# Patient Record
Sex: Male | Born: 1955
Health system: Southern US, Community
[De-identification: ages and names within clinical notes are randomized; demographics above are authoritative.]

## PROBLEM LIST (undated history)

## (undated) DIAGNOSIS — K219 Gastro-esophageal reflux disease without esophagitis: Secondary | ICD-10-CM

## (undated) DIAGNOSIS — Z21 Asymptomatic human immunodeficiency virus [HIV] infection status: Secondary | ICD-10-CM

## (undated) DIAGNOSIS — M069 Rheumatoid arthritis, unspecified: Secondary | ICD-10-CM

## (undated) DIAGNOSIS — J302 Other seasonal allergic rhinitis: Secondary | ICD-10-CM

## (undated) DIAGNOSIS — B20 Human immunodeficiency virus [HIV] disease: Secondary | ICD-10-CM

## (undated) HISTORY — PX: KNEE SURGERY: SHX244

## (undated) HISTORY — DX: Gastro-esophageal reflux disease without esophagitis: K21.9

## (undated) HISTORY — PX: NASAL SEPTUM SURGERY: SHX37

---

## 1998-05-11 ENCOUNTER — Ambulatory Visit (HOSPITAL_COMMUNITY): Admission: RE | Admit: 1998-05-11 | Discharge: 1998-05-11 | Payer: Self-pay

## 2000-11-06 ENCOUNTER — Encounter (INDEPENDENT_AMBULATORY_CARE_PROVIDER_SITE_OTHER): Payer: Self-pay | Admitting: *Deleted

## 2000-11-06 LAB — CONVERTED CEMR LAB: CD4 T Cell Abs: 456

## 2000-12-11 ENCOUNTER — Ambulatory Visit (HOSPITAL_COMMUNITY): Admission: RE | Admit: 2000-12-11 | Discharge: 2000-12-11 | Payer: Self-pay | Admitting: Internal Medicine

## 2000-12-11 ENCOUNTER — Encounter: Admission: RE | Admit: 2000-12-11 | Discharge: 2000-12-11 | Payer: Self-pay | Admitting: Internal Medicine

## 2001-01-01 ENCOUNTER — Encounter: Admission: RE | Admit: 2001-01-01 | Discharge: 2001-01-01 | Payer: Self-pay | Admitting: Internal Medicine

## 2001-01-04 ENCOUNTER — Ambulatory Visit (HOSPITAL_COMMUNITY): Admission: RE | Admit: 2001-01-04 | Discharge: 2001-01-04 | Payer: Self-pay | Admitting: Internal Medicine

## 2001-01-04 ENCOUNTER — Encounter: Admission: RE | Admit: 2001-01-04 | Discharge: 2001-01-04 | Payer: Self-pay | Admitting: Internal Medicine

## 2001-02-08 ENCOUNTER — Encounter: Admission: RE | Admit: 2001-02-08 | Discharge: 2001-02-08 | Payer: Self-pay | Admitting: Internal Medicine

## 2001-02-25 ENCOUNTER — Encounter: Admission: RE | Admit: 2001-02-25 | Discharge: 2001-02-25 | Payer: Self-pay | Admitting: Internal Medicine

## 2001-04-07 ENCOUNTER — Encounter: Admission: RE | Admit: 2001-04-07 | Discharge: 2001-04-07 | Payer: Self-pay | Admitting: Internal Medicine

## 2001-04-07 ENCOUNTER — Ambulatory Visit (HOSPITAL_COMMUNITY): Admission: RE | Admit: 2001-04-07 | Discharge: 2001-04-07 | Payer: Self-pay | Admitting: Internal Medicine

## 2001-04-27 ENCOUNTER — Encounter: Admission: RE | Admit: 2001-04-27 | Discharge: 2001-04-27 | Payer: Self-pay | Admitting: Internal Medicine

## 2001-08-11 ENCOUNTER — Encounter: Admission: RE | Admit: 2001-08-11 | Discharge: 2001-08-11 | Payer: Self-pay | Admitting: Internal Medicine

## 2001-08-11 ENCOUNTER — Ambulatory Visit (HOSPITAL_COMMUNITY): Admission: RE | Admit: 2001-08-11 | Discharge: 2001-08-11 | Payer: Self-pay | Admitting: Internal Medicine

## 2001-08-25 ENCOUNTER — Encounter: Admission: RE | Admit: 2001-08-25 | Discharge: 2001-08-25 | Payer: Self-pay | Admitting: Internal Medicine

## 2001-10-08 ENCOUNTER — Encounter: Admission: RE | Admit: 2001-10-08 | Discharge: 2001-10-08 | Payer: Self-pay | Admitting: Infectious Diseases

## 2001-11-08 ENCOUNTER — Ambulatory Visit (HOSPITAL_COMMUNITY): Admission: RE | Admit: 2001-11-08 | Discharge: 2001-11-08 | Payer: Self-pay | Admitting: Internal Medicine

## 2001-11-08 ENCOUNTER — Encounter: Admission: RE | Admit: 2001-11-08 | Discharge: 2001-11-08 | Payer: Self-pay | Admitting: Internal Medicine

## 2001-11-23 ENCOUNTER — Encounter: Admission: RE | Admit: 2001-11-23 | Discharge: 2001-11-23 | Payer: Self-pay | Admitting: Internal Medicine

## 2002-02-07 ENCOUNTER — Ambulatory Visit (HOSPITAL_COMMUNITY): Admission: RE | Admit: 2002-02-07 | Discharge: 2002-02-07 | Payer: Self-pay | Admitting: Internal Medicine

## 2002-02-07 ENCOUNTER — Encounter: Admission: RE | Admit: 2002-02-07 | Discharge: 2002-02-07 | Payer: Self-pay | Admitting: Internal Medicine

## 2002-02-22 ENCOUNTER — Ambulatory Visit (HOSPITAL_COMMUNITY): Admission: RE | Admit: 2002-02-22 | Discharge: 2002-02-22 | Payer: Self-pay | Admitting: Internal Medicine

## 2002-02-22 ENCOUNTER — Encounter: Admission: RE | Admit: 2002-02-22 | Discharge: 2002-02-22 | Payer: Self-pay | Admitting: Internal Medicine

## 2002-03-04 ENCOUNTER — Encounter: Admission: RE | Admit: 2002-03-04 | Discharge: 2002-03-04 | Payer: Self-pay | Admitting: Internal Medicine

## 2002-04-12 ENCOUNTER — Ambulatory Visit (HOSPITAL_COMMUNITY): Admission: RE | Admit: 2002-04-12 | Discharge: 2002-04-12 | Payer: Self-pay | Admitting: Internal Medicine

## 2002-04-12 ENCOUNTER — Encounter: Admission: RE | Admit: 2002-04-12 | Discharge: 2002-04-12 | Payer: Self-pay | Admitting: Internal Medicine

## 2002-05-03 ENCOUNTER — Encounter: Admission: RE | Admit: 2002-05-03 | Discharge: 2002-05-03 | Payer: Self-pay | Admitting: Internal Medicine

## 2002-07-15 ENCOUNTER — Encounter: Admission: RE | Admit: 2002-07-15 | Discharge: 2002-07-15 | Payer: Self-pay | Admitting: Internal Medicine

## 2002-07-15 ENCOUNTER — Ambulatory Visit (HOSPITAL_COMMUNITY): Admission: RE | Admit: 2002-07-15 | Discharge: 2002-07-15 | Payer: Self-pay | Admitting: Internal Medicine

## 2002-08-02 ENCOUNTER — Encounter: Admission: RE | Admit: 2002-08-02 | Discharge: 2002-08-02 | Payer: Self-pay | Admitting: Internal Medicine

## 2002-09-13 ENCOUNTER — Ambulatory Visit (HOSPITAL_COMMUNITY): Admission: RE | Admit: 2002-09-13 | Discharge: 2002-09-13 | Payer: Self-pay | Admitting: Internal Medicine

## 2002-09-27 ENCOUNTER — Encounter: Admission: RE | Admit: 2002-09-27 | Discharge: 2002-09-27 | Payer: Self-pay | Admitting: Internal Medicine

## 2002-12-12 ENCOUNTER — Ambulatory Visit (HOSPITAL_COMMUNITY): Admission: RE | Admit: 2002-12-12 | Discharge: 2002-12-12 | Payer: Self-pay | Admitting: Internal Medicine

## 2002-12-12 ENCOUNTER — Encounter: Admission: RE | Admit: 2002-12-12 | Discharge: 2002-12-12 | Payer: Self-pay | Admitting: Internal Medicine

## 2002-12-26 ENCOUNTER — Encounter: Admission: RE | Admit: 2002-12-26 | Discharge: 2002-12-26 | Payer: Self-pay | Admitting: Internal Medicine

## 2003-04-04 ENCOUNTER — Encounter: Payer: Self-pay | Admitting: Internal Medicine

## 2003-04-04 ENCOUNTER — Encounter: Admission: RE | Admit: 2003-04-04 | Discharge: 2003-04-04 | Payer: Self-pay | Admitting: Internal Medicine

## 2003-04-18 ENCOUNTER — Encounter: Admission: RE | Admit: 2003-04-18 | Discharge: 2003-04-18 | Payer: Self-pay | Admitting: Internal Medicine

## 2003-10-03 ENCOUNTER — Encounter: Payer: Self-pay | Admitting: Internal Medicine

## 2003-10-03 ENCOUNTER — Encounter: Admission: RE | Admit: 2003-10-03 | Discharge: 2003-10-03 | Payer: Self-pay | Admitting: Internal Medicine

## 2003-10-03 ENCOUNTER — Ambulatory Visit (HOSPITAL_COMMUNITY): Admission: RE | Admit: 2003-10-03 | Discharge: 2003-10-03 | Payer: Self-pay | Admitting: Internal Medicine

## 2003-10-17 ENCOUNTER — Encounter: Admission: RE | Admit: 2003-10-17 | Discharge: 2003-10-17 | Payer: Self-pay | Admitting: Internal Medicine

## 2004-03-19 ENCOUNTER — Encounter: Admission: RE | Admit: 2004-03-19 | Discharge: 2004-03-19 | Payer: Self-pay | Admitting: Internal Medicine

## 2004-03-19 ENCOUNTER — Ambulatory Visit (HOSPITAL_COMMUNITY): Admission: RE | Admit: 2004-03-19 | Discharge: 2004-03-19 | Payer: Self-pay | Admitting: Internal Medicine

## 2004-04-09 ENCOUNTER — Encounter: Admission: RE | Admit: 2004-04-09 | Discharge: 2004-04-09 | Payer: Self-pay | Admitting: Internal Medicine

## 2004-09-10 ENCOUNTER — Ambulatory Visit: Payer: Self-pay | Admitting: Internal Medicine

## 2004-09-10 ENCOUNTER — Ambulatory Visit (HOSPITAL_COMMUNITY): Admission: RE | Admit: 2004-09-10 | Discharge: 2004-09-10 | Payer: Self-pay | Admitting: Internal Medicine

## 2004-09-24 ENCOUNTER — Ambulatory Visit: Payer: Self-pay | Admitting: Internal Medicine

## 2004-10-22 ENCOUNTER — Ambulatory Visit: Payer: Self-pay | Admitting: Internal Medicine

## 2005-02-25 ENCOUNTER — Ambulatory Visit (HOSPITAL_COMMUNITY): Admission: RE | Admit: 2005-02-25 | Discharge: 2005-02-25 | Payer: Self-pay | Admitting: Internal Medicine

## 2005-02-25 ENCOUNTER — Ambulatory Visit: Payer: Self-pay | Admitting: Internal Medicine

## 2005-03-11 ENCOUNTER — Ambulatory Visit: Payer: Self-pay | Admitting: Internal Medicine

## 2005-08-26 ENCOUNTER — Ambulatory Visit (HOSPITAL_COMMUNITY): Admission: RE | Admit: 2005-08-26 | Discharge: 2005-08-26 | Payer: Self-pay | Admitting: Internal Medicine

## 2005-08-26 ENCOUNTER — Encounter (INDEPENDENT_AMBULATORY_CARE_PROVIDER_SITE_OTHER): Payer: Self-pay | Admitting: *Deleted

## 2005-08-26 ENCOUNTER — Ambulatory Visit: Payer: Self-pay | Admitting: Internal Medicine

## 2005-08-26 LAB — CONVERTED CEMR LAB
CD4 Count: 730 microliters
HIV 1 RNA Quant: 399 copies/mL

## 2005-09-09 ENCOUNTER — Ambulatory Visit: Payer: Self-pay | Admitting: Internal Medicine

## 2006-01-27 ENCOUNTER — Ambulatory Visit: Payer: Self-pay | Admitting: Internal Medicine

## 2006-01-27 ENCOUNTER — Encounter (INDEPENDENT_AMBULATORY_CARE_PROVIDER_SITE_OTHER): Payer: Self-pay | Admitting: *Deleted

## 2006-01-27 LAB — CONVERTED CEMR LAB: HIV 1 RNA Quant: 399 copies/mL

## 2006-02-10 ENCOUNTER — Ambulatory Visit: Payer: Self-pay | Admitting: Internal Medicine

## 2006-06-22 ENCOUNTER — Encounter (INDEPENDENT_AMBULATORY_CARE_PROVIDER_SITE_OTHER): Payer: Self-pay | Admitting: *Deleted

## 2006-06-22 ENCOUNTER — Encounter: Admission: RE | Admit: 2006-06-22 | Discharge: 2006-06-22 | Payer: Self-pay | Admitting: Internal Medicine

## 2006-06-22 ENCOUNTER — Ambulatory Visit: Payer: Self-pay | Admitting: Internal Medicine

## 2006-06-22 LAB — CONVERTED CEMR LAB: CD4 Count: 630 microliters

## 2006-08-17 ENCOUNTER — Ambulatory Visit: Payer: Self-pay | Admitting: Internal Medicine

## 2007-02-01 ENCOUNTER — Encounter (INDEPENDENT_AMBULATORY_CARE_PROVIDER_SITE_OTHER): Payer: Self-pay | Admitting: *Deleted

## 2007-02-01 LAB — CONVERTED CEMR LAB

## 2007-02-09 ENCOUNTER — Ambulatory Visit: Payer: Self-pay | Admitting: Internal Medicine

## 2007-02-09 ENCOUNTER — Encounter: Admission: RE | Admit: 2007-02-09 | Discharge: 2007-02-09 | Payer: Self-pay | Admitting: Internal Medicine

## 2007-02-09 LAB — CONVERTED CEMR LAB
AST: 25 units/L (ref 0–37)
Albumin: 4.4 g/dL (ref 3.5–5.2)
Alkaline Phosphatase: 73 units/L (ref 39–117)
Basophils Relative: 0 % (ref 0–1)
Calcium: 9.6 mg/dL (ref 8.4–10.5)
Chloride: 105 meq/L (ref 96–112)
Eosinophils Absolute: 0.1 10*3/uL (ref 0.0–0.7)
HIV 1 RNA Quant: 376 copies/mL — ABNORMAL HIGH (ref <50)
HIV-1 RNA Quant, Log: 2.58 — ABNORMAL HIGH (ref <1.70)
LDL Cholesterol: 99 mg/dL (ref 0–99)
Lymphs Abs: 1.6 10*3/uL (ref 0.7–3.3)
MCV: 87.8 fL (ref 78.0–100.0)
Monocytes Relative: 8 % (ref 3–11)
Neutro Abs: 2.9 10*3/uL (ref 1.7–7.7)
Neutrophils Relative %: 58 % (ref 43–77)
PSA: 0.53 ng/mL (ref 0.10–4.00)
Platelets: 218 10*3/uL (ref 150–400)
Potassium: 4.4 meq/L (ref 3.5–5.3)
RBC: 4.99 M/uL (ref 4.22–5.81)
Sodium: 138 meq/L (ref 135–145)
Total Protein: 7.5 g/dL (ref 6.0–8.3)
WBC: 5 10*3/uL (ref 4.0–10.5)

## 2007-02-12 DIAGNOSIS — J309 Allergic rhinitis, unspecified: Secondary | ICD-10-CM | POA: Insufficient documentation

## 2007-02-12 DIAGNOSIS — S99919A Unspecified injury of unspecified ankle, initial encounter: Secondary | ICD-10-CM

## 2007-02-12 DIAGNOSIS — F329 Major depressive disorder, single episode, unspecified: Secondary | ICD-10-CM

## 2007-02-12 DIAGNOSIS — B029 Zoster without complications: Secondary | ICD-10-CM | POA: Insufficient documentation

## 2007-02-12 DIAGNOSIS — R197 Diarrhea, unspecified: Secondary | ICD-10-CM | POA: Insufficient documentation

## 2007-02-12 DIAGNOSIS — K5289 Other specified noninfective gastroenteritis and colitis: Secondary | ICD-10-CM

## 2007-02-12 DIAGNOSIS — L408 Other psoriasis: Secondary | ICD-10-CM

## 2007-02-12 DIAGNOSIS — J342 Deviated nasal septum: Secondary | ICD-10-CM

## 2007-02-12 DIAGNOSIS — S8990XA Unspecified injury of unspecified lower leg, initial encounter: Secondary | ICD-10-CM

## 2007-02-12 DIAGNOSIS — B20 Human immunodeficiency virus [HIV] disease: Secondary | ICD-10-CM | POA: Insufficient documentation

## 2007-02-12 DIAGNOSIS — S99929A Unspecified injury of unspecified foot, initial encounter: Secondary | ICD-10-CM

## 2007-02-14 ENCOUNTER — Encounter (INDEPENDENT_AMBULATORY_CARE_PROVIDER_SITE_OTHER): Payer: Self-pay | Admitting: *Deleted

## 2007-02-23 ENCOUNTER — Ambulatory Visit: Payer: Self-pay | Admitting: Internal Medicine

## 2007-02-23 DIAGNOSIS — B009 Herpesviral infection, unspecified: Secondary | ICD-10-CM | POA: Insufficient documentation

## 2007-02-23 DIAGNOSIS — K219 Gastro-esophageal reflux disease without esophagitis: Secondary | ICD-10-CM

## 2007-05-10 ENCOUNTER — Encounter: Payer: Self-pay | Admitting: Internal Medicine

## 2007-07-27 ENCOUNTER — Telehealth: Payer: Self-pay | Admitting: Internal Medicine

## 2007-08-03 ENCOUNTER — Encounter: Admission: RE | Admit: 2007-08-03 | Discharge: 2007-08-03 | Payer: Self-pay | Admitting: Internal Medicine

## 2007-08-03 ENCOUNTER — Telehealth: Payer: Self-pay | Admitting: Internal Medicine

## 2007-08-03 ENCOUNTER — Ambulatory Visit: Payer: Self-pay | Admitting: Internal Medicine

## 2007-08-23 ENCOUNTER — Ambulatory Visit: Payer: Self-pay | Admitting: Internal Medicine

## 2007-08-23 ENCOUNTER — Ambulatory Visit (HOSPITAL_COMMUNITY): Admission: RE | Admit: 2007-08-23 | Discharge: 2007-08-23 | Payer: Self-pay | Admitting: Internal Medicine

## 2007-08-25 ENCOUNTER — Ambulatory Visit: Payer: Self-pay | Admitting: Internal Medicine

## 2007-08-30 ENCOUNTER — Telehealth: Payer: Self-pay | Admitting: Internal Medicine

## 2007-10-06 ENCOUNTER — Encounter: Payer: Self-pay | Admitting: Internal Medicine

## 2007-11-23 ENCOUNTER — Ambulatory Visit: Payer: Self-pay | Admitting: Internal Medicine

## 2007-11-26 ENCOUNTER — Encounter (INDEPENDENT_AMBULATORY_CARE_PROVIDER_SITE_OTHER): Payer: Self-pay | Admitting: *Deleted

## 2007-12-07 ENCOUNTER — Encounter (INDEPENDENT_AMBULATORY_CARE_PROVIDER_SITE_OTHER): Payer: Self-pay | Admitting: *Deleted

## 2008-05-08 ENCOUNTER — Ambulatory Visit: Payer: Self-pay | Admitting: Internal Medicine

## 2008-05-08 ENCOUNTER — Encounter: Admission: RE | Admit: 2008-05-08 | Discharge: 2008-05-08 | Payer: Self-pay | Admitting: Internal Medicine

## 2008-05-08 LAB — CONVERTED CEMR LAB
ALT: 24 units/L (ref 0–53)
Alkaline Phosphatase: 79 units/L (ref 39–117)
Basophils Absolute: 0 10*3/uL (ref 0.0–0.1)
Basophils Relative: 0 % (ref 0–1)
CO2: 23 meq/L (ref 19–32)
Cholesterol: 168 mg/dL (ref 0–200)
Creatinine, Ser: 0.84 mg/dL (ref 0.40–1.50)
Eosinophils Absolute: 0.1 10*3/uL (ref 0.0–0.7)
Eosinophils Relative: 2 % (ref 0–5)
HCT: 42.9 % (ref 39.0–52.0)
HIV 1 RNA Quant: <50 copies/mL (ref <50)
Hemoglobin: 14.8 g/dL (ref 13.0–17.0)
Lymphocytes Relative: 33 % (ref 12–46)
MCHC: 34.5 g/dL (ref 30.0–36.0)
MCV: 88.6 fL (ref 78.0–100.0)
Monocytes Absolute: 0.5 10*3/uL (ref 0.1–1.0)
RDW: 13.3 % (ref 11.5–15.5)
Total Bilirubin: 0.5 mg/dL (ref 0.3–1.2)
Total CHOL/HDL Ratio: 3.6
VLDL: 26 mg/dL (ref 0–40)

## 2008-05-23 ENCOUNTER — Ambulatory Visit: Payer: Self-pay | Admitting: Internal Medicine

## 2008-05-23 DIAGNOSIS — K589 Irritable bowel syndrome without diarrhea: Secondary | ICD-10-CM

## 2008-09-12 ENCOUNTER — Encounter: Payer: Self-pay | Admitting: Internal Medicine

## 2008-09-20 ENCOUNTER — Encounter (INDEPENDENT_AMBULATORY_CARE_PROVIDER_SITE_OTHER): Payer: Self-pay | Admitting: *Deleted

## 2008-10-24 ENCOUNTER — Ambulatory Visit: Payer: Self-pay | Admitting: Internal Medicine

## 2008-11-14 ENCOUNTER — Ambulatory Visit: Payer: Self-pay | Admitting: Internal Medicine

## 2008-11-23 ENCOUNTER — Telehealth: Payer: Self-pay | Admitting: Internal Medicine

## 2009-04-25 ENCOUNTER — Ambulatory Visit: Payer: Self-pay | Admitting: Internal Medicine

## 2009-04-25 LAB — CONVERTED CEMR LAB
Albumin: 4.3 g/dL (ref 3.5–5.2)
Basophils Absolute: 0 10*3/uL (ref 0.0–0.1)
CO2: 23 meq/L (ref 19–32)
Cholesterol: 186 mg/dL (ref 0–200)
GFR calc Af Amer: >60 mL/min (ref >60)
GFR calc non Af Amer: >60 mL/min (ref >60)
Glucose, Bld: 93 mg/dL (ref 70–99)
HIV-1 RNA Quant, Log: 2.36 — ABNORMAL HIGH (ref <1.68)
LDL Cholesterol: 92 mg/dL (ref 0–99)
Lymphocytes Relative: 36 % (ref 12–46)
Neutro Abs: 2.8 10*3/uL (ref 1.7–7.7)
Platelets: 216 10*3/uL (ref 150–400)
Potassium: 4.3 meq/L (ref 3.5–5.3)
RDW: 13.5 % (ref 11.5–15.5)
Sodium: 138 meq/L (ref 135–145)
Total Bilirubin: 0.4 mg/dL (ref 0.3–1.2)
Total Protein: 7.4 g/dL (ref 6.0–8.3)
Triglycerides: 238 mg/dL — ABNORMAL HIGH (ref <150)

## 2009-05-15 ENCOUNTER — Ambulatory Visit: Payer: Self-pay | Admitting: Internal Medicine

## 2009-05-21 ENCOUNTER — Encounter (INDEPENDENT_AMBULATORY_CARE_PROVIDER_SITE_OTHER): Payer: Self-pay | Admitting: *Deleted

## 2009-11-07 ENCOUNTER — Ambulatory Visit: Payer: Self-pay | Admitting: Internal Medicine

## 2009-11-07 LAB — CONVERTED CEMR LAB: HIV 1 RNA Quant: 84 copies/mL — ABNORMAL HIGH (ref <48)

## 2009-11-22 ENCOUNTER — Ambulatory Visit: Payer: Self-pay | Admitting: Internal Medicine

## 2010-05-20 ENCOUNTER — Ambulatory Visit: Payer: Self-pay | Admitting: Internal Medicine

## 2010-05-20 LAB — CONVERTED CEMR LAB
ALT: 21 units/L (ref 0–53)
AST: 23 units/L (ref 0–37)
Alkaline Phosphatase: 73 units/L (ref 39–117)
Basophils Relative: 0 % (ref 0–1)
CO2: 21 meq/L (ref 19–32)
Cholesterol: 180 mg/dL (ref 0–200)
HIV 1 RNA Quant: <48 copies/mL (ref <48)
HIV-1 RNA Quant, Log: <1.68 (ref <1.68)
LDL Cholesterol: 101 mg/dL — ABNORMAL HIGH (ref 0–99)
MCHC: 34.9 g/dL (ref 30.0–36.0)
Monocytes Relative: 6 % (ref 3–12)
Neutro Abs: 3.3 10*3/uL (ref 1.7–7.7)
Neutrophils Relative %: 57 % (ref 43–77)
Platelets: 233 10*3/uL (ref 150–400)
RBC: 4.69 M/uL (ref 4.22–5.81)
Sodium: 139 meq/L (ref 135–145)
Total Bilirubin: 0.5 mg/dL (ref 0.3–1.2)
Total Protein: 7.2 g/dL (ref 6.0–8.3)
VLDL: 24 mg/dL (ref 0–40)
WBC: 5.9 10*3/uL (ref 4.0–10.5)

## 2010-06-04 ENCOUNTER — Ambulatory Visit: Payer: Self-pay | Admitting: Internal Medicine

## 2010-11-11 ENCOUNTER — Ambulatory Visit: Payer: Self-pay | Admitting: Internal Medicine

## 2010-11-11 LAB — CONVERTED CEMR LAB
ALT: 25 units/L (ref 0–53)
AST: 28 units/L (ref 0–37)
Alkaline Phosphatase: 72 units/L (ref 39–117)
BUN: 18 mg/dL (ref 6–23)
Basophils Absolute: 0 10*3/uL (ref 0.0–0.1)
Cholesterol: 191 mg/dL (ref 0–200)
Creatinine, Ser: 0.95 mg/dL (ref 0.40–1.50)
Eosinophils Absolute: 0.1 10*3/uL (ref 0.0–0.7)
Eosinophils Relative: 3 % (ref 0–5)
HCT: 41.2 % (ref 39.0–52.0)
HIV 1 RNA Quant: <20 copies/mL (ref <20)
HIV-1 RNA Quant, Log: <1.3 (ref <1.30)
Hemoglobin: 14.4 g/dL (ref 13.0–17.0)
Lymphs Abs: 1.8 10*3/uL (ref 0.7–4.0)
MCV: 88.8 fL (ref 78.0–100.0)
Monocytes Absolute: 0.4 10*3/uL (ref 0.1–1.0)
Platelets: 211 10*3/uL (ref 150–400)
RDW: 13.3 % (ref 11.5–15.5)
Total Bilirubin: 0.4 mg/dL (ref 0.3–1.2)
Total CHOL/HDL Ratio: 3.5
VLDL: 29 mg/dL (ref 0–40)

## 2010-11-14 ENCOUNTER — Emergency Department (HOSPITAL_BASED_OUTPATIENT_CLINIC_OR_DEPARTMENT_OTHER)
Admission: EM | Admit: 2010-11-14 | Discharge: 2010-11-14 | Payer: Self-pay | Source: Home / Self Care | Admitting: Emergency Medicine

## 2010-11-15 ENCOUNTER — Encounter: Payer: Self-pay | Admitting: Internal Medicine

## 2010-11-20 ENCOUNTER — Encounter (INDEPENDENT_AMBULATORY_CARE_PROVIDER_SITE_OTHER): Payer: Self-pay | Admitting: *Deleted

## 2010-11-26 ENCOUNTER — Ambulatory Visit: Payer: Self-pay | Admitting: Internal Medicine

## 2010-11-26 DIAGNOSIS — R040 Epistaxis: Secondary | ICD-10-CM

## 2011-01-07 NOTE — Assessment & Plan Note (Signed)
Summary: F/U/VS   CC:  follow-up visit.  History of Present Illness: Craig Morales is in for his routine visit. He is doing very well with the exception of some dry itchy external ear canals.  Used to have some medicated cream to be used that loss that many years ago.  He has not missed any doses of his medications.  He is requesting prescriptions for his over-the-counter allergy medicines so that he can access his healthcare spending account.  He remains sexually active with his long-time male partner who was negative.  The practice mutual masturbation.  Preventive Screening-Counseling & Management  Alcohol-Tobacco     Alcohol drinks/day: occassionally     Alcohol type: beer or wine occasionally     Smoking Status: never     Passive Smoke Exposure: yes  Caffeine-Diet-Exercise     Caffeine use/day: soda occassionally     Does Patient Exercise: yes     Type of exercise: gym membership     Exercise (avg: min/session): 30-60     Times/week: 3  Hep-HIV-STD-Contraception     HIV Risk: no  Safety-Violence-Falls     Seat Belt Use: yes  Comments: declined condoms      Sexual History:  currently monogamous.        Drug Use:  never.     Updated Prior Medication List: ATRIPLA 600-200-300 MG TABS (EFAVIRENZ-EMTRICITAB-TENOFOVIR) Take 1 tablet by mouth once a day PRILOSEC 40 MG  CPDR (OMEPRAZOLE) Take 1 capsule by mouth two times a day ZYRTEC ALLERGY 10 MG TABS (CETIRIZINE HCL) Take 1 tablet by mouth once a day MUCINEX DM 30-600 MG XR12H-TAB (DEXTROMETHORPHAN-GUAIFENESIN) Take 1 tablet by mouth once a day AFRIN NASAL SPRAY 0.05 % SOLN (OXYMETAZOLINE HCL) as needed VALTREX 500 MG TABS (VALACYCLOVIR HCL) Take 1 tablet by mouth two times a day  Current Allergies (reviewed today): ! HYDROCODONE ! * SQUID Social History: Sexual History:  currently monogamous  Vital Signs:  Patient profile:   55 year old male Height:      72 inches (182.88 cm) Weight:      178.75 pounds (81.25  kg) BMI:     24.33 Temp:     97.3 degrees F (36.28 degrees C) oral Pulse rate:   69 / minute BP sitting:   123 / 82  (left arm) Cuff size:   large  Vitals Entered By: Jennet Maduro RN (June 04, 2010 10:15 AM) CC: follow-up visit Is Patient Diabetic? No Pain Assessment Patient in pain? no      Nutritional Status BMI of 19 -24 = normal Nutritional Status Detail appetite "good"  Have you ever been in a relationship where you felt threatened, hurt or afraid?No   Does patient need assistance? Functional Status Self care Ambulation Normal Comments no missed doses of rxes   Physical Exam  General:  alert and overweight-appearing.   Ears:  he has dry flaking skin on his external ear canals without any other evidence of inflammation. Mouth:  good dentition and pharynx pink and moist.  good dentition.   Lungs:  normal breath sounds.  no crackles and no wheezes.   Heart:  normal rate, regular rhythm, and no murmur.   Skin:  no rashes.   Psych:  normally interactive, good eye contact, not anxious appearing, and not depressed appearing.          Medication Adherence: 06/04/2010   Adherence to medications reviewed with patient. Counseling to provide adequate adherence provided   Prevention For Positives: 06/04/2010  Safe sex practices discussed with patient. Condoms offered.                             Impression & Recommendations:  Problem # 1:  HIV DISEASE (ICD-042) His HIV infection remains under excellent control.  I will not make any changes today. His updated medication list for this problem includes:    Valtrex 500 Mg Tabs (Valacyclovir hcl) .Marland Kitchen... Take 1 tablet by mouth two times a day  Diagnostics Reviewed:  CD4: 530 (05/21/2010)   WBC: 5.9 (05/20/2010)   Hgb: 14.5 (05/20/2010)   HCT: 41.6 (05/20/2010)   Platelets: 233 (05/20/2010) HIV-1 RNA: <48 copies/mL (05/20/2010)   HBSAg: No (02/01/2007)  Problem # 2:  PSORIASIS (ICD-696.1) I suspect that his problem  with his ears as a variant of his psoriasis and is suggested he try over-the-counter cortisone cream. Orders: Est. Patient Level III ([address]109)  Medications Added to Medication List This Visit: 1)  Zyrtec Allergy 10 Mg Tabs (Cetirizine hcl) .... Take 1 tablet by mouth once a day 2)  Mucinex Dm 30-600 Mg Xr12h-tab (Dextromethorphan-guaifenesin) .... Take 1 tablet by mouth once a day 3)  Afrin Nasal Spray 0.05 % Soln (Oxymetazoline hcl) .... As needed  Other Orders: Future Orders: T-CD4SP ([facility]) (CD4SP) ... 12/01/2010 T-HIV Viral Load [phone]) ... 12/01/2010 T-Comprehensive Metabolic Panel [id number]) ... 12/01/2010 T-CBC w/Diff ([id number]) ... 12/01/2010 T-RPR (Syphilis) [id number]) ... 12/01/2010 T-Lipid Profile [phone]) ... 12/01/2010  Patient Instructions: 1)  Please schedule a follow-up appointment in 6 months.  Prescriptions: AFRIN NASAL SPRAY 0.05 % SOLN (OXYMETAZOLINE HCL) as needed  #2 x 1   Entered and Authorized by:   Cliffton Asters MD   Signed by:   Cliffton Asters MD on 06/04/2010   Method used:   Print then Give to Patient   RxID:   [id number] MUCINEX DM 30-600 MG XR12H-TAB (DEXTROMETHORPHAN-GUAIFENESIN) Take 1 tablet by mouth once a day  #30 x 11   Entered and Authorized by:   Cliffton Asters MD   Signed by:   Cliffton Asters MD on 06/04/2010   Method used:   Print then Give to Patient   RxID:   [id number] ZYRTEC ALLERGY 10 MG TABS (CETIRIZINE HCL) Take 1 tablet by mouth once a day  #30 x 11   Entered and Authorized by:   Cliffton Asters MD   Signed by:   Cliffton Asters MD on 06/04/2010   Method used:   Print then Give to Patient   RxID:   [id number]         Medication Adherence: 06/04/2010   Adherence to medications reviewed with patient. Counseling to provide adequate adherence provided    Prevention For Positives: 06/04/2010   Safe sex practices discussed with patient. Condoms offered.

## 2011-01-09 NOTE — Miscellaneous (Signed)
  Clinical Lists Changes  Observations: Added new observation of YEARAIDSPOS: 2006  (11/20/2010 11:35) Added new observation of HIV STATUS: CDC-defined AIDS  (11/20/2010 11:35)

## 2011-01-09 NOTE — Consult Note (Signed)
Summary: [facility]   Imported By: Florinda Marker 11/26/2010 10:07:15  _____________________________________________________________________  External Attachment:    Type:   Image     Comment:   External Document

## 2011-01-09 NOTE — Assessment & Plan Note (Signed)
Summary: Craig Morales   CC:  follow-up visit, lab results, and pt. went to ED two weeks ago for bloody nose.  History of Present Illness: Craig Morales is in for his routine visit.  He has not missed any doses of his Atripla since his last visit.  He is doing well with the exception of several recent episodes of nosebleed.  He has had 3 in the past several weeks the last one lasting about 30 minutes.  He went to the nose is known emergency department in [place].  While there the bleeding stopped spontaneously.  He did not have to have his nose packed.  He was referred to Dr. Suzanna Obey for evaluation.  Apparently cauterization of one vessel was considered but Dr. Jearld Fenton felt that was unnecessary.  He had been using Afrin nasal spray around that time.  Dr. Jearld Fenton advised against this and had him use saline nasal spray, intranasal Vaseline and a room humidifier which he has done.  Preventive Screening-Counseling & Management  Alcohol-Tobacco     Alcohol drinks/day: occassionally     Alcohol type: beer or wine occasionally     Smoking Status: never     Passive Smoke Exposure: yes  Caffeine-Diet-Exercise     Caffeine use/day: soda occassionally     Does Patient Exercise: yes     Type of exercise: gym membership     Exercise (avg: min/session): 30-60     Times/week: 3  Hep-HIV-STD-Contraception     HIV Risk: no  Safety-Violence-Falls     Seat Belt Use: yes      Sexual History:  currently monogamous.        Drug Use:  never.     Prior Medication List:  ATRIPLA 600-200-300 MG TABS (EFAVIRENZ-EMTRICITAB-TENOFOVIR) Take 1 tablet by mouth once a day PRILOSEC 40 MG  CPDR (OMEPRAZOLE) Take 1 capsule by mouth two times a day ZYRTEC ALLERGY 10 MG TABS (CETIRIZINE HCL) Take 1 tablet by mouth once a day MUCINEX DM 30-600 MG XR12H-TAB (DEXTROMETHORPHAN-GUAIFENESIN) Take 1 tablet by mouth once a day AFRIN NASAL SPRAY 0.05 % SOLN (OXYMETAZOLINE HCL) as needed VALTREX 500 MG TABS (VALACYCLOVIR HCL) Take 1  tablet by mouth two times a day   Current Allergies (reviewed today): ! HYDROCODONE ! * SQUID Vital Signs:  Patient profile:   55 year old male Height:      72 inches (182.88 cm) Weight:      172.8 pounds (78.55 kg) BMI:     23.52 Temp:     97.6 degrees F (36.44 degrees C) oral Pulse rate:   82 / minute BP sitting:   123 / 83  (left arm)  Vitals Entered By: Wendall Mola CMA Duncan Dull) (November 26, 2010 10:01 AM) CC: follow-up visit, lab results, pt. went to ED two weeks ago for bloody nose Is Patient Diabetic? No Pain Assessment Patient in pain? no      Nutritional Status BMI of 19 -24 = normal Nutritional Status Detail appetite "good"  Have you ever been in a relationship where you felt threatened, hurt or afraid?No   Does patient need assistance? Functional Status Self care Ambulation Normal Comments no missed doses of meds per pt.   Physical Exam  General:  alert and well-nourished.   Mouth:  good dentition and pharynx pink and moist.  good dentition.   Lungs:  normal breath sounds.  no crackles and no wheezes.   Heart:  normal rate, regular rhythm, and no murmur.     Impression &  Recommendations:  Problem # 1:  HIV DISEASE (ICD-042) His adherence is excellent and as a result his infection remains under very good control.  I will continue his current regimen. His updated medication list for this problem includes:    Valtrex 500 Mg Tabs (Valacyclovir hcl) .Marland Kitchen... Take 1 tablet by mouth two times a day  Diagnostics Reviewed:  HIV: CDC-defined AIDS (11/20/2010)   CD4: 630 (11/12/2010)   WBC: 4.9 (11/11/2010)   Hgb: 14.4 (11/11/2010)   HCT: 41.2 (11/11/2010)   Platelets: 211 (11/11/2010) HIV-1 RNA: <20 copies/mL (11/11/2010)   HBSAg: No (02/01/2007)  Other Orders: Est. Patient Level III (16109) Future Orders: T-CD4SP ([facility]) (CD4SP) ... 05/25/2011 T-HIV Viral Load [id number]) ... 05/25/2011 T-Comprehensive Metabolic Panel [id number]) ...  05/25/2011 T-CBC w/Diff ([id number]) ... 05/25/2011 T-RPR (Syphilis) [id number]) ... 05/25/2011 T-Lipid Profile [id number]) ... 05/25/2011  Patient Instructions: 1)  Please schedule a follow-up appointment in 6 months.         Medication Adherence: 11/26/2010   Adherence to medications reviewed with patient. Counseling to provide adequate adherence provided                                 Immunization History:  Influenza Immunization History:    Influenza:  historical (10/23/2010)

## 2011-02-17 LAB — T-HELPER CELL (CD4) - (RCID CLINIC ONLY): CD4 T Cell Abs: 630 uL (ref 400–2700)

## 2011-02-18 LAB — CBC
Hemoglobin: 14.3 g/dL (ref 13.0–17.0)
MCH: 32.2 pg (ref 26.0–34.0)
MCV: 91.8 fL (ref 78.0–100.0)
Platelets: 222 10*3/uL (ref 150–400)
RBC: 4.43 MIL/uL (ref 4.22–5.81)
WBC: 6.7 10*3/uL (ref 4.0–10.5)

## 2011-02-18 LAB — DIFFERENTIAL
Eosinophils Absolute: 0.3 10*3/uL (ref 0.0–0.7)
Lymphocytes Relative: 28 % (ref 12–46)
Lymphs Abs: 1.9 10*3/uL (ref 0.7–4.0)
Monocytes Relative: 9 % (ref 3–12)
Neutrophils Relative %: 59 % (ref 43–77)

## 2011-02-24 LAB — T-HELPER CELL (CD4) - (RCID CLINIC ONLY)
CD4 % Helper T Cell: 29 % — ABNORMAL LOW (ref 33–55)
CD4 T Cell Abs: 530 uL (ref 400–2700)

## 2011-03-11 LAB — T-HELPER CELL (CD4) - (RCID CLINIC ONLY): CD4 % Helper T Cell: 32 % — ABNORMAL LOW (ref 33–55)

## 2011-03-18 LAB — T-HELPER CELL (CD4) - (RCID CLINIC ONLY)
CD4 % Helper T Cell: 34 % (ref 33–55)
CD4 T Cell Abs: 670 uL (ref 400–2700)

## 2011-04-30 ENCOUNTER — Other Ambulatory Visit (INDEPENDENT_AMBULATORY_CARE_PROVIDER_SITE_OTHER): Payer: 59

## 2011-04-30 DIAGNOSIS — B20 Human immunodeficiency virus [HIV] disease: Secondary | ICD-10-CM

## 2011-04-30 DIAGNOSIS — Z79899 Other long term (current) drug therapy: Secondary | ICD-10-CM

## 2011-04-30 DIAGNOSIS — Z113 Encounter for screening for infections with a predominantly sexual mode of transmission: Secondary | ICD-10-CM

## 2011-04-30 LAB — LIPID PANEL
LDL Cholesterol: 103 mg/dL — ABNORMAL HIGH (ref 0–99)
VLDL: 27 mg/dL (ref 0–40)

## 2011-05-01 LAB — CBC WITH DIFFERENTIAL/PLATELET
Basophils Absolute: 0 10*3/uL (ref 0.0–0.1)
Basophils Relative: 0 % (ref 0–1)
Eosinophils Absolute: 0.2 10*3/uL (ref 0.0–0.7)
MCH: 31.3 pg (ref 26.0–34.0)
MCHC: 34.8 g/dL (ref 30.0–36.0)
Neutrophils Relative %: 60 % (ref 43–77)
Platelets: 222 10*3/uL (ref 150–400)

## 2011-05-01 LAB — COMPLETE METABOLIC PANEL WITH GFR
Albumin: 4.2 g/dL (ref 3.5–5.2)
Alkaline Phosphatase: 74 U/L (ref 39–117)
BUN: 18 mg/dL (ref 6–23)
CO2: 26 mEq/L (ref 19–32)
Chloride: 101 mEq/L (ref 96–112)
GFR, Est African American: >60 mL/min (ref >60)
GFR, Est Non African American: >60 mL/min (ref >60)
Glucose, Bld: 85 mg/dL (ref 70–99)
Potassium: 4.3 mEq/L (ref 3.5–5.3)
Sodium: 138 mEq/L (ref 135–145)
Total Bilirubin: 0.4 mg/dL (ref 0.3–1.2)
Total Protein: 7.4 g/dL (ref 6.0–8.3)

## 2011-05-01 LAB — RPR

## 2011-05-01 LAB — T-HELPER CELL (CD4) - (RCID CLINIC ONLY): CD4 T Cell Abs: 640 uL (ref 400–2700)

## 2011-05-14 ENCOUNTER — Ambulatory Visit (INDEPENDENT_AMBULATORY_CARE_PROVIDER_SITE_OTHER): Payer: 59 | Admitting: Internal Medicine

## 2011-05-14 ENCOUNTER — Encounter: Payer: Self-pay | Admitting: Internal Medicine

## 2011-05-14 VITALS — BP 117/76 | HR 65 | Temp 97.6°F | Ht 72.0 in | Wt 174.7 lb

## 2011-05-14 DIAGNOSIS — J309 Allergic rhinitis, unspecified: Secondary | ICD-10-CM

## 2011-05-14 DIAGNOSIS — Z23 Encounter for immunization: Secondary | ICD-10-CM

## 2011-05-14 DIAGNOSIS — M79605 Pain in left leg: Secondary | ICD-10-CM | POA: Insufficient documentation

## 2011-05-14 DIAGNOSIS — B20 Human immunodeficiency virus [HIV] disease: Secondary | ICD-10-CM

## 2011-05-14 DIAGNOSIS — M79609 Pain in unspecified limb: Secondary | ICD-10-CM

## 2011-05-14 MED ORDER — FLUTICASONE PROPIONATE 50 MCG/ACT NA SUSP: 2.0000 | Freq: Every day | NASAL | Status: DC

## 2011-05-14 MED ORDER — CETIRIZINE HCL 10 MG PO TABS: 10.0000 mg | ORAL_TABLET | Freq: Every day | ORAL | Status: DC

## 2011-05-14 MED ORDER — DM-GUAIFENESIN ER 30-600 MG PO TB12: 1.0000 | ORAL_TABLET | Freq: Every day | ORAL | Status: DC

## 2011-05-14 NOTE — Progress Notes (Signed)
  Subjective:    Patient ID: Craig Morales, male    DOB: 1956/07/02, 55 y.o.   MRN: [id number]  HPI Craig Morales is in for his routine visit. He states that he is doing well with the exception of his usual seasonal allergies and some recent left ankle discomfort and numbness in his left thigh when he is up walking. He never misses a single dose of his Atripla. When reviewing the medications he is taking for his allergies he tells me that he uses Afrin nasal spray each evening at bedtime. He used to use Flonase but his prescription expired.    Review of Systems     Objective:   Physical Exam  Constitutional: He appears well-developed and well-nourished. No distress.  HENT:  Mouth/Throat: Oropharynx is clear and moist. No oropharyngeal exudate.  Cardiovascular: Normal rate, regular rhythm and normal heart sounds.   No murmur heard. Pulmonary/Chest: Breath sounds normal. He has no wheezes. He has no rales.  Musculoskeletal:       Examination of his left leg and ankle normal signs of inflammation. His strength is intact. Sensation to light touch is intact and deep tendon reflexes are normal. His gait is normal.          Assessment & Plan:

## 2011-05-14 NOTE — Assessment & Plan Note (Signed)
Am not sure what is causing his left ankle discomfort and numbness in his left thigh. It occurs mostly when he is up walking. He has not had any recent injury. This may be due to low back strain or disc disease. I asked him to mention this to his chiropractor who he sees monthly.

## 2011-05-14 NOTE — Assessment & Plan Note (Signed)
I've asked him to taper off of the Afrin over the next one to 2 weeks and I will prescribe Flonase.

## 2011-05-14 NOTE — Progress Notes (Signed)
Addended by: Jennet Maduro D on: 05/14/2011 04:24 PM   Modules accepted: Orders

## 2011-05-14 NOTE — Assessment & Plan Note (Signed)
His infection remains under excellent control. I will continue his Atripla.

## 2011-09-04 LAB — T-HELPER CELL (CD4) - (RCID CLINIC ONLY): CD4 T Cell Abs: 480

## 2011-09-09 LAB — T-HELPER CELL (CD4) - (RCID CLINIC ONLY)
CD4 % Helper T Cell: 31 — ABNORMAL LOW
CD4 T Cell Abs: 620

## 2011-09-14 ENCOUNTER — Emergency Department (HOSPITAL_BASED_OUTPATIENT_CLINIC_OR_DEPARTMENT_OTHER)
Admission: EM | Admit: 2011-09-14 | Discharge: 2011-09-14 | Disposition: A | Discharge status: Home / Self Care | Payer: 59 | Attending: Emergency Medicine | Admitting: Emergency Medicine

## 2011-09-14 ENCOUNTER — Emergency Department (INDEPENDENT_AMBULATORY_CARE_PROVIDER_SITE_OTHER): Payer: 59

## 2011-09-14 ENCOUNTER — Encounter (HOSPITAL_BASED_OUTPATIENT_CLINIC_OR_DEPARTMENT_OTHER): Payer: Self-pay | Admitting: Emergency Medicine

## 2011-09-14 DIAGNOSIS — M47812 Spondylosis without myelopathy or radiculopathy, cervical region: Secondary | ICD-10-CM

## 2011-09-14 DIAGNOSIS — M47814 Spondylosis without myelopathy or radiculopathy, thoracic region: Secondary | ICD-10-CM

## 2011-09-14 DIAGNOSIS — Z79899 Other long term (current) drug therapy: Secondary | ICD-10-CM | POA: Insufficient documentation

## 2011-09-14 DIAGNOSIS — M542 Cervicalgia: Secondary | ICD-10-CM | POA: Insufficient documentation

## 2011-09-14 HISTORY — DX: Human immunodeficiency virus (HIV) disease: B20

## 2011-09-14 HISTORY — DX: Other seasonal allergic rhinitis: J30.2

## 2011-09-14 HISTORY — DX: Asymptomatic human immunodeficiency virus (hiv) infection status: Z21

## 2011-09-14 MED ORDER — KETOROLAC TROMETHAMINE 60 MG/2ML IM SOLN
30.0000 mg | Freq: Once | INTRAMUSCULAR | Status: AC
  Administered 2011-09-14: 30 mg via INTRAMUSCULAR
  Filled 2011-09-14: qty 2

## 2011-09-14 MED ORDER — HYDROMORPHONE HCL 1 MG/ML IJ SOLN
1.0000 mg | Freq: Once | INTRAMUSCULAR | Status: AC
  Administered 2011-09-14: 1 mg via INTRAMUSCULAR
  Filled 2011-09-14: qty 1

## 2011-09-14 MED ORDER — OXYCODONE-ACETAMINOPHEN 5-325 MG PO TABS: 2.0000 | ORAL_TABLET | ORAL | Status: AC | PRN

## 2011-09-14 MED ORDER — DIAZEPAM 5 MG PO TABS
5.0000 mg | ORAL_TABLET | Freq: Once | ORAL | Status: AC
  Administered 2011-09-14: 5 mg via ORAL
  Filled 2011-09-14: qty 1

## 2011-09-14 MED ORDER — DIAZEPAM 5 MG PO TABS: 5.0000 mg | ORAL_TABLET | Freq: Two times a day (BID) | ORAL | Status: AC

## 2011-09-14 NOTE — ED Notes (Signed)
Pt c/o LT neck pain & stiffness since Fri am; was seen at an urgent care Fri for same- received Flexeril & Tramadol, but is not having relief; no known injury

## 2011-09-14 NOTE — ED Provider Notes (Signed)
History     CSN: [id number] Arrival date & time: 09/14/2011 11:19 AM  Chief Complaint  Patient presents with  . Neck Pain    (Consider location/radiation/quality/duration/timing/severity/associated sxs/prior treatment) Patient is a 55 y.o. male presenting with neck pain. The history is provided by the patient.  Neck Pain    patient here with left-sided neck pain for the past 3 days that has been atraumatic. Was seen at urgent care for the same placed on medications which have not helped pain is worse with movements of his head as well as his arm better with remaining still. No fever or rash photophobia and. does have a history of prior cervical disc degeneration.  Past Medical History  Diagnosis Date  . Seasonal allergies     Past Surgical History  Procedure Date  . Knee surgery   . Nasal septum surgery     No family history on file.  History  Substance Use Topics  . Smoking status: Never Smoker   . Smokeless tobacco: Never Used  . Alcohol Use: 3.5 oz/week    7 drink(s) per week      Review of Systems  HENT: Positive for neck pain.   All other systems reviewed and are negative.    Allergies  Hydrocodone  Home Medications   Current Outpatient Rx  Name Route Sig Dispense Refill  . CYCLOBENZAPRINE HCL 5 MG PO TABS Oral Take 5 mg by mouth 3 (three) times daily as needed.      Marland Kitchen TRAMADOL HCL 50 MG PO TABS Oral Take 50 mg by mouth every 8 (eight) hours as needed.      Marland Kitchen CETIRIZINE HCL 10 MG PO TABS Oral Take 1 tablet (10 mg total) by mouth daily. 30 tablet 11  . DEXTROMETHORPHAN-GUAIFENESIN 30-600 MG PO TB12 Oral Take 1 tablet by mouth daily. 30 tablet 11  . EFAVIRENZ-EMTRICITAB-TENOFOVIR 600-200-300 MG PO TABS Oral Take 1 tablet by mouth daily.      Marland Kitchen FLUTICASONE PROPIONATE 50 MCG/ACT NA SUSP Nasal Place 2 sprays into the nose daily. 16 g 11  . OMEPRAZOLE 40 MG PO CPDR Oral Take 40 mg by mouth 2 (two) times daily.      Marland Kitchen VALACYCLOVIR HCL 500 MG PO TABS Oral Take  500 mg by mouth 2 (two) times daily.        BP 128/80  Pulse 72  Resp 18  SpO2 98%  Physical Exam  Nursing note and vitals reviewed. Constitutional: He is oriented to person, place, and time. Vital signs are normal. He appears well-developed and well-nourished.  Non-toxic appearance. No distress.  HENT:  Head: Normocephalic and atraumatic.  Eyes: Conjunctivae and EOM are normal. Pupils are equal, round, and reactive to light.  Neck: Neck supple. Muscular tenderness present. No tracheal tenderness present. No rigidity. Decreased range of motion present. No erythema present.    Cardiovascular: Normal rate, regular rhythm and normal heart sounds.  Exam reveals no gallop.   No murmur heard. Pulmonary/Chest: Effort normal and breath sounds normal. No stridor. No respiratory distress. He has no wheezes.  Abdominal: Soft. Normal appearance and bowel sounds are normal. He exhibits no distension. There is no tenderness. There is no rebound.  Musculoskeletal: He exhibits no edema and no tenderness.  Neurological: He is alert and oriented to person, place, and time. He has normal strength. No cranial nerve deficit or sensory deficit. GCS eye subscore is 4. GCS verbal subscore is 5. GCS motor subscore is 6.  Skin: Skin is warm  and dry.  Psychiatric: He has a normal mood and affect. His speech is normal and behavior is normal.    ED Course  Procedures (including critical care time)  Labs Reviewed - No data to display No results found.   No diagnosis found.    MDM  Dg Cervical Spine Complete  09/14/2011  *RADIOLOGY REPORT*  Clinical Data: Left-sided neck pain.  No trauma.  Right arm dysfunction.  CERVICAL SPINE - COMPLETE 4+ VIEW  Comparison: None.  Findings: Straightening of the normal cervical lordosis.  Between 1 mm and 2 mm anterolisthesis of C5 on C6 appears degenerative. Severe disc space loss is present at C6-C7 and C7-T1.  Mild disc space loss at C5-C6.  C2-C3 and C3-C4 facet  arthrosis is present.  The prevertebral soft tissues are normal.  Predental space is within normal limits. Craniocervical alignment normal.  There is no fracture.  There is bilateral C7-T1 foraminal stenosis with right C6-C7 foraminal stenosis.  This is due to a combination of uncovertebral spurring and facet arthropathy.  Bilateral pleural apical thickening is present, commonly associated with cigarette smoking.  Odontoid is suboptimally visualized.  C1- C2 degenerative disease.  Cervicothoracic junction is visualized and appears normal.  IMPRESSION: 1.  Cervical spondylosis with foraminal stenosis at C6-C7 and C7-T1 could account for upper extremity radicular symptoms. 2.  Odontoid poorly visualized.  No gross odontoid fracture or displacement. 3.  Straightening of the normal cervical lordosis with likely degenerative mild anterolisthesis of C5 on C6.  Original Report Authenticated By: Andreas Newport, M.D.   She given medication for pain and feels better his x-ray results were reviewed with him, will discharge to        Toy Baker, MD 09/14/11 1345

## 2011-09-19 LAB — T-HELPER CELL (CD4) - (RCID CLINIC ONLY): CD4 T Cell Abs: 550

## 2011-11-12 ENCOUNTER — Other Ambulatory Visit: Payer: 59

## 2011-11-12 ENCOUNTER — Other Ambulatory Visit: Payer: Self-pay | Admitting: Internal Medicine

## 2011-11-12 DIAGNOSIS — B20 Human immunodeficiency virus [HIV] disease: Secondary | ICD-10-CM

## 2011-11-13 ENCOUNTER — Other Ambulatory Visit: Payer: 59

## 2011-11-14 LAB — HIV-1 RNA QUANT-NO REFLEX-BLD: HIV 1 RNA Quant: <20 copies/mL (ref <20)

## 2011-11-27 ENCOUNTER — Ambulatory Visit (INDEPENDENT_AMBULATORY_CARE_PROVIDER_SITE_OTHER): Payer: 59 | Admitting: Internal Medicine

## 2011-11-27 ENCOUNTER — Encounter: Payer: Self-pay | Admitting: Internal Medicine

## 2011-11-27 DIAGNOSIS — M479 Spondylosis, unspecified: Secondary | ICD-10-CM

## 2011-11-27 DIAGNOSIS — B20 Human immunodeficiency virus [HIV] disease: Secondary | ICD-10-CM

## 2011-11-27 NOTE — Progress Notes (Signed)
Patient ID: Craig Morales, male   DOB: 21-Feb-1956, 55 y.o.   MRN: [id number]  INFECTIOUS DISEASE PROGRESS NOTE    Subjective: Craig Morales is in for his routine visit. He has not missed any doses of his Atripla. His partner recently had a bad cold that lasted for over a month and he recently came down with congestion and a bad cough. He has been sick for about 3 days now. He has not had any fever or shortness of breath. He did stop his regular use of Afrin and went back to [facility] after his last visit. He has been using some Afrin since he got sick 3 days ago.  In October he had the sudden onset of severe her neck pain and stiffness. He did not have any injury. He saw his chiropractor without relief and then [facility]. When he did not get better he went to the [facility] emergency department where he was treated with Percocet. Since that time he has been seen by a sports medicine specialist in one of the neurosurgeons with [facility] neurosurgery. He tells me that he's had an MRI and 2 x-rays which show significant degenerative spine disease. He has been taking Mobic and Lyrica and his neck pain is much better. He has not taken any Mobic in the last 3 days since he got sick with this cold.  Objective:   General: He has a mask on the cause of his cold. He is in good spirits. Skin: No rash Lungs: Clear throughout Cor: Regular S1 and S2 no murmurs   Lab Results Lab Results  Component Value Date   WBC 5.4 04/30/2011   HGB 14.6 04/30/2011   HCT 42.0 04/30/2011   MCV 89.9 04/30/2011   PLT 222 04/30/2011    Lab Results  Component Value Date   CREATININE 0.94 04/30/2011   BUN 18 04/30/2011   NA 138 04/30/2011   K 4.3 04/30/2011   CL 101 04/30/2011   CO2 26 04/30/2011    Lab Results  Component Value Date   ALT 21 04/30/2011   AST 25 04/30/2011   ALKPHOS 74 04/30/2011   BILITOT 0.4 04/30/2011      HIV 1 RNA Quant (copies/mL)  Date Value  11/12/2011 <20   04/30/2011 <20     11/11/2010 <20 copies/mL      CD4 T Cell Abs (cmm)  Date Value  11/12/2011 680   04/30/2011 640   11/11/2010 630        Microbiology: No results found for this or any previous visit (from the past 240 hour(s)).  Studies/Results: No results found.   Assessment: His HIV infection remains under excellent control. I will continue his Atripla.  He has an upper respiratory infection with bronchitis that is probably viral. He received his influenza vaccine one month ago. I've instructed him to continue symptomatic therapy.  His degenerative spine disease and neck pain have improved with time and medical therapy. I told him that it's probably okay to not take the Mobic on a regular basis if he is not having pain.  Plan: 1. Continue Atripla 2. Return to clinic after lab work in 6 months.   Cliffton Asters, MD [facility] for Infectious Diseases [facility] [phone] pager   [phone] cell 11/27/2011, 10:49 AM

## 2011-12-24 ENCOUNTER — Other Ambulatory Visit: Payer: Self-pay | Admitting: Internal Medicine

## 2011-12-24 DIAGNOSIS — B20 Human immunodeficiency virus [HIV] disease: Secondary | ICD-10-CM

## 2012-01-09 ENCOUNTER — Telehealth: Payer: Self-pay | Admitting: *Deleted

## 2012-01-09 ENCOUNTER — Other Ambulatory Visit: Payer: Self-pay | Admitting: *Deleted

## 2012-01-09 DIAGNOSIS — B20 Human immunodeficiency virus [HIV] disease: Secondary | ICD-10-CM

## 2012-01-09 MED ORDER — EFAVIRENZ-EMTRICITAB-TENOFOVIR 600-200-300 MG PO TABS: 1.0000 | ORAL_TABLET | Freq: Every day | ORAL | Status: DC

## 2012-01-09 NOTE — Telephone Encounter (Signed)
Patient called requesting Atripla be sent to [facility] as a 90 day supply.  Rx sent Wendall Mola CMA

## 2012-01-23 ENCOUNTER — Other Ambulatory Visit: Payer: Self-pay | Admitting: *Deleted

## 2012-01-23 DIAGNOSIS — B20 Human immunodeficiency virus [HIV] disease: Secondary | ICD-10-CM

## 2012-01-23 MED ORDER — EFAVIRENZ-EMTRICITAB-TENOFOVIR 600-200-300 MG PO TABS: 1.0000 | ORAL_TABLET | Freq: Every day | ORAL | Status: DC

## 2012-01-23 NOTE — Telephone Encounter (Signed)
Patient has changed insurances and is not able to get his medications as a 90 day supply. Changed the Rx and sent to the [facility] he provided.

## 2012-01-26 ENCOUNTER — Other Ambulatory Visit: Payer: Self-pay | Admitting: *Deleted

## 2012-01-26 DIAGNOSIS — B20 Human immunodeficiency virus [HIV] disease: Secondary | ICD-10-CM

## 2012-01-26 MED ORDER — EFAVIRENZ-EMTRICITAB-TENOFOVIR 600-200-300 MG PO TABS: 1.0000 | ORAL_TABLET | Freq: Every day | ORAL | Status: DC

## 2012-02-29 ENCOUNTER — Other Ambulatory Visit: Payer: Self-pay | Admitting: Internal Medicine

## 2012-02-29 DIAGNOSIS — K219 Gastro-esophageal reflux disease without esophagitis: Secondary | ICD-10-CM

## 2012-05-20 ENCOUNTER — Other Ambulatory Visit: Payer: 59

## 2012-05-20 DIAGNOSIS — B20 Human immunodeficiency virus [HIV] disease: Secondary | ICD-10-CM

## 2012-05-21 LAB — COMPREHENSIVE METABOLIC PANEL
Albumin: 4.2 g/dL (ref 3.5–5.2)
CO2: 26 mEq/L (ref 19–32)
Calcium: 9.2 mg/dL (ref 8.4–10.5)
Glucose, Bld: 83 mg/dL (ref 70–99)
Potassium: 4.2 mEq/L (ref 3.5–5.3)
Sodium: 137 mEq/L (ref 135–145)
Total Protein: 7.2 g/dL (ref 6.0–8.3)

## 2012-05-21 LAB — CBC
Hemoglobin: 14.1 g/dL (ref 13.0–17.0)
RBC: 4.56 MIL/uL (ref 4.22–5.81)

## 2012-05-21 LAB — LIPID PANEL
Cholesterol: 172 mg/dL (ref 0–200)
Triglycerides: 198 mg/dL — ABNORMAL HIGH (ref <150)

## 2012-05-21 LAB — T-HELPER CELL (CD4) - (RCID CLINIC ONLY): CD4 % Helper T Cell: 37 % (ref 33–55)

## 2012-05-24 LAB — HIV-1 RNA QUANT-NO REFLEX-BLD
HIV 1 RNA Quant: <20 copies/mL (ref <20)
HIV-1 RNA Quant, Log: <1.3 {Log} (ref <1.30)

## 2012-06-03 ENCOUNTER — Encounter: Payer: Self-pay | Admitting: Internal Medicine

## 2012-06-03 ENCOUNTER — Ambulatory Visit (INDEPENDENT_AMBULATORY_CARE_PROVIDER_SITE_OTHER): Payer: 59 | Admitting: Internal Medicine

## 2012-06-03 VITALS — BP 125/77 | HR 70 | Temp 97.8°F | Wt 176.0 lb

## 2012-06-03 DIAGNOSIS — K219 Gastro-esophageal reflux disease without esophagitis: Secondary | ICD-10-CM

## 2012-06-03 DIAGNOSIS — B20 Human immunodeficiency virus [HIV] disease: Secondary | ICD-10-CM

## 2012-06-03 DIAGNOSIS — J309 Allergic rhinitis, unspecified: Secondary | ICD-10-CM

## 2012-06-03 MED ORDER — FLUTICASONE PROPIONATE 50 MCG/ACT NA SUSP: 2.0000 | Freq: Every day | NASAL | Status: DC

## 2012-06-03 MED ORDER — OMEPRAZOLE 40 MG PO CPDR: 40.0000 mg | DELAYED_RELEASE_CAPSULE | Freq: Two times a day (BID) | ORAL | Status: DC

## 2012-06-03 NOTE — Progress Notes (Signed)
Patient ID: Craig Morales, male   DOB: [birth year], 56 y.o.   MRN: [id number]     [facility] for Infectious Disease  Patient Active Problem List  Diagnosis  . HIV DISEASE  . HERPES ZOSTER, UNCOMPLICATED  . HERPES LABIALIS  . DEPRESSION  . DEVIATED NASAL SEPTUM  . ALLERGIC RHINITIS  . GERD  . GASTROENTERITIS  . IRRITABLE BOWEL SYNDROME  . PSORIASIS  . DIARRHEA  . KNEE INJURY  . EPISTAXIS, RECURRENT  . Leg pain, left  . Degenerative arthritis of spine    Patient's Medications  New Prescriptions   No medications on file  Previous Medications   CETIRIZINE (ZYRTEC) 10 MG TABLET    Take 1 tablet (10 mg total) by mouth daily.   DEXTROMETHORPHAN-GUAIFENESIN (MUCINEX DM) 30-600 MG PER 12 HR TABLET    Take 1 tablet by mouth daily.   EFAVIRENZ-EMTRICTABINE-TENOFOVIR (ATRIPLA) 600-200-300 MG PER TABLET    Take 1 tablet by mouth daily.   NAPROXEN SODIUM (ANAPROX) 220 MG TABLET    Take 220 mg by mouth 2 (two) times daily with a meal.   OMEPRAZOLE (PRILOSEC) 40 MG CAPSULE    TAKE 1 CAPSULE TWICE DAILY   PREGABALIN (LYRICA) 75 MG CAPSULE    Take 75 mg by mouth daily.    VALACYCLOVIR (VALTREX) 500 MG TABLET    Take 500 mg by mouth 2 (two) times daily.    Modified Medications   Modified Medication Previous Medication   FLUTICASONE (FLONASE) 50 MCG/ACT NASAL SPRAY fluticasone (FLONASE) 50 MCG/ACT nasal spray      Place 2 sprays into the nose daily.    Place 2 sprays into the nose daily.  Discontinued Medications   MELOXICAM (MOBIC) 7.5 MG TABLET    Take 7.5 mg by mouth 2 (two) times daily.      Subjective: Craig Morales is in for his routine visit. He has not missed any doses of his Atripla. He is to make an appointment for his screening colonoscopy soon and he has been thinking about establishing a primary care physician closer to his home and [place].  Objective: Temp: 97.8 F (36.6 C) (06/27 1511) Temp src: Oral (06/27 1511) BP: 125/77 mmHg (06/27 1511) Pulse Rate: 70  (06/27  1511)  General: He is in good spirits as usual Skin: No rash Lungs: Clear Cor: Regular S1 and S2 and no murmurs  Lab Results HIV 1 RNA Quant (copies/mL)  Date Value  05/20/2012 <20   11/12/2011 <20   04/30/2011 <20      CD4 T Cell Abs (cmm)  Date Value  05/20/2012 740   11/12/2011 680   04/30/2011 640      Assessment: His HIV infection remains under excellent control. I will continue Atripla.  Plan: 1. Followup care after lab work in 6 months   Cliffton Asters, MD [facility] for Infectious Disease [facility] [phone] pager   [phone] cell 06/03/2012, 3:37 PM

## 2012-06-07 ENCOUNTER — Other Ambulatory Visit: Payer: Self-pay | Admitting: Internal Medicine

## 2012-10-05 ENCOUNTER — Encounter: Payer: Self-pay | Admitting: Internal Medicine

## 2012-10-05 ENCOUNTER — Ambulatory Visit (INDEPENDENT_AMBULATORY_CARE_PROVIDER_SITE_OTHER): Payer: 59 | Admitting: Internal Medicine

## 2012-10-05 VITALS — BP 102/68 | HR 72 | Temp 97.6°F | Resp 16 | Ht 69.5 in | Wt 172.8 lb

## 2012-10-05 DIAGNOSIS — J309 Allergic rhinitis, unspecified: Secondary | ICD-10-CM

## 2012-10-05 DIAGNOSIS — K219 Gastro-esophageal reflux disease without esophagitis: Secondary | ICD-10-CM

## 2012-10-05 MED ORDER — OMEPRAZOLE 40 MG PO CPDR: 40.0000 mg | DELAYED_RELEASE_CAPSULE | Freq: Two times a day (BID) | ORAL | Status: DC

## 2012-10-05 MED ORDER — FLUTICASONE PROPIONATE 50 MCG/ACT NA SUSP: 2.0000 | Freq: Every day | NASAL | Status: DC

## 2012-10-06 NOTE — Assessment & Plan Note (Signed)
Stable and under average control. Refill omeprazole.

## 2012-10-06 NOTE — Progress Notes (Signed)
  Subjective:    Patient ID: Craig Morales, male    DOB: Sep 09, 1956, 56 y.o.   MRN: [id number]  HPI patient presents to clinic to establish medical care and followup of multiple medical problems. Has known history of HIV followed by infectious disease with undetectable viral load and normal CD4 counts. Has known history of cervical radicular pain status post neurosurgery evaluation and MRI. Not recommended for surgery. Currently takes Lyrica. GERD symptoms are predominantly controlled with Prilosec but does have intermittent food triggers. Reviewed labs obtained by infectious disease June 2013 including CBC, Chem-7, LFT and fasting lipid profile. Had only minimal triglyceride elevation. Has already received influenza vaccine for the season. Requests refill of Flonase for rhinitis.  Past Medical History  Diagnosis Date  . Seasonal allergies   . HIV (human immunodeficiency virus infection)    Past Surgical History  Procedure Date  . Knee surgery   . Nasal septum surgery     reports that he has never smoked. He has never used smokeless tobacco. He reports that he drinks about 3.5 ounces of alcohol per week. He reports that he does not use illicit drugs. family history is not on file. Allergies  Allergen Reactions  . Hydrocodone     REACTION: SOB and chest pain     Review of Systems  Musculoskeletal: Negative for back pain.  Neurological: Negative for weakness and numbness.  All other systems reviewed and are negative.       Objective:   Physical Exam  Nursing note and vitals reviewed. Constitutional: He appears well-developed and well-nourished. No distress.  HENT:  Head: Normocephalic and atraumatic.  Right Ear: External ear normal.  Left Ear: External ear normal.  Nose: Nose normal.  Mouth/Throat: Oropharynx is clear and moist. No oropharyngeal exudate.  Eyes: Conjunctivae normal and EOM are normal. Pupils are equal, round, and reactive to light. No scleral icterus.  Neck:  Neck supple.  Cardiovascular: Normal rate, regular rhythm and normal heart sounds.  Exam reveals no gallop and no friction rub.   No murmur heard. Pulmonary/Chest: Effort normal and breath sounds normal. No respiratory distress. He has no wheezes. He has no rales.  Lymphadenopathy:    He has no cervical adenopathy.  Neurological: He is alert.  Skin: Skin is warm. He is not diaphoretic.  Psychiatric: He has a normal mood and affect.          Assessment & Plan:

## 2012-10-06 NOTE — Assessment & Plan Note (Signed)
Stable. Refill Flonase.  

## 2012-10-20 ENCOUNTER — Other Ambulatory Visit: Payer: 59

## 2012-10-20 DIAGNOSIS — B20 Human immunodeficiency virus [HIV] disease: Secondary | ICD-10-CM

## 2012-10-20 LAB — CBC
Hemoglobin: 14.4 g/dL (ref 13.0–17.0)
MCHC: 34.4 g/dL (ref 30.0–36.0)
RDW: 13.7 % (ref 11.5–15.5)

## 2012-10-21 LAB — HIV-1 RNA QUANT-NO REFLEX-BLD
HIV 1 RNA Quant: <20 copies/mL (ref <20)
HIV-1 RNA Quant, Log: <1.3 {Log} (ref <1.30)

## 2012-10-21 LAB — COMPREHENSIVE METABOLIC PANEL
ALT: 21 U/L (ref 0–53)
AST: 17 U/L (ref 0–37)
Albumin: 4.1 g/dL (ref 3.5–5.2)
BUN: 12 mg/dL (ref 6–23)
CO2: 30 mEq/L (ref 19–32)
Calcium: 9.1 mg/dL (ref 8.4–10.5)
Chloride: 104 mEq/L (ref 96–112)
Potassium: 4.2 mEq/L (ref 3.5–5.3)

## 2012-10-21 LAB — LIPID PANEL
Cholesterol: 191 mg/dL (ref 0–200)
VLDL: 41 mg/dL — ABNORMAL HIGH (ref 0–40)

## 2012-10-21 LAB — RPR

## 2012-11-03 ENCOUNTER — Ambulatory Visit (INDEPENDENT_AMBULATORY_CARE_PROVIDER_SITE_OTHER): Payer: 59 | Admitting: Internal Medicine

## 2012-11-03 ENCOUNTER — Encounter: Payer: Self-pay | Admitting: Internal Medicine

## 2012-11-03 VITALS — BP 121/86 | HR 78 | Temp 98.4°F | Ht 72.0 in | Wt 178.0 lb

## 2012-11-03 DIAGNOSIS — B20 Human immunodeficiency virus [HIV] disease: Secondary | ICD-10-CM

## 2012-11-03 NOTE — Progress Notes (Signed)
Patient ID: Craig Morales, male   DOB: 1956/08/31, 56 y.o.   MRN: [id number]     [facility] for Infectious Disease  Patient Active Problem List  Diagnosis  . HIV DISEASE  . HERPES ZOSTER, UNCOMPLICATED  . HERPES LABIALIS  . DEPRESSION  . DEVIATED NASAL SEPTUM  . ALLERGIC RHINITIS  . GERD  . GASTROENTERITIS  . IRRITABLE BOWEL SYNDROME  . PSORIASIS  . DIARRHEA  . KNEE INJURY  . EPISTAXIS, RECURRENT  . Degenerative arthritis of spine    Patient's Medications  New Prescriptions   No medications on file  Previous Medications   ASCORBIC ACID (VITAMIN C WITH ROSE HIPS) 500 MG TABLET    Take 500 mg by mouth daily.   CETIRIZINE (ZYRTEC) 10 MG TABLET    Take 1 tablet (10 mg total) by mouth daily.   DEXTROMETHORPHAN-GUAIFENESIN (MUCINEX DM) 30-600 MG PER 12 HR TABLET    Take 1 tablet by mouth daily as needed.   EFAVIRENZ-EMTRICTABINE-TENOFOVIR (ATRIPLA) 600-200-300 MG PER TABLET    Take 1 tablet by mouth daily.   FLUTICASONE (FLONASE) 50 MCG/ACT NASAL SPRAY    Place 2 sprays into the nose daily.   MELATONIN 5 MG CAPS    Take 1 each by mouth at bedtime.   MISC NATURAL PRODUCTS (OSTEO BI-FLEX ADV JOINT SHIELD) TABS    Take 1 each by mouth daily.   MULTIPLE VITAMINS-MINERALS (CENTRUM SILVER ADULT 50+ PO)    Take 1 each by mouth daily.   NAPROXEN SODIUM (ANAPROX) 220 MG TABLET    Take 220 mg by mouth 2 (two) times daily with a meal.   OMEPRAZOLE (PRILOSEC) 40 MG CAPSULE    Take 1 capsule (40 mg total) by mouth 2 (two) times daily.   PREGABALIN (LYRICA) 75 MG CAPSULE    Take 75 mg by mouth daily.    VALACYCLOVIR (VALTREX) 500 MG TABLET       VITAMIN E 400 UNIT CAPSULE    Take 400 Units by mouth daily.  Modified Medications   No medications on file  Discontinued Medications   No medications on file    Subjective: Craig Morales is in for his routine visit. His usual he never misses a single dose of his Atripla. He is feeling well. He has already had his influenza  vaccination.  Objective: Temp: 98.4 F (36.9 C) (11/27 1535) Temp src: Oral (11/27 1535) BP: 121/86 mmHg (11/27 1535) Pulse Rate: 78  (11/27 1535)  General: is in good spirits Skin: no rash Lungs: clear Cor: regular S1 and S2 no murmurs  Lab Results HIV 1 RNA Quant (copies/mL)  Date Value  10/20/2012 <20   05/20/2012 <20   11/12/2011 <20      CD4 T Cell Abs (cmm)  Date Value  10/20/2012 770   05/20/2012 740   11/12/2011 680      Assessment: His infection remains under excellent control.  Plan: 1. Continue Atripla 2. Followup after blood work in 6 months   Cliffton Asters, MD [facility] for Infectious Disease [facility] [phone] pager   [phone] cell 11/03/2012, 3:50 PM

## 2013-02-07 ENCOUNTER — Other Ambulatory Visit: Payer: Self-pay | Admitting: *Deleted

## 2013-02-07 DIAGNOSIS — B20 Human immunodeficiency virus [HIV] disease: Secondary | ICD-10-CM

## 2013-02-07 MED ORDER — EFAVIRENZ-EMTRICITAB-TENOFOVIR 600-200-300 MG PO TABS: 1.0000 | ORAL_TABLET | Freq: Every day | ORAL | Status: DC

## 2013-04-26 ENCOUNTER — Other Ambulatory Visit: Payer: 59

## 2013-04-26 DIAGNOSIS — B20 Human immunodeficiency virus [HIV] disease: Secondary | ICD-10-CM

## 2013-04-26 LAB — CBC
Hemoglobin: 13.2 g/dL (ref 13.0–17.0)
Platelets: 204 10*3/uL (ref 150–400)
RBC: 4.37 MIL/uL (ref 4.22–5.81)
WBC: 5.4 10*3/uL (ref 4.0–10.5)

## 2013-04-26 LAB — COMPREHENSIVE METABOLIC PANEL
Albumin: 4 g/dL (ref 3.5–5.2)
Alkaline Phosphatase: 84 U/L (ref 39–117)
BUN: 11 mg/dL (ref 6–23)
Calcium: 8.9 mg/dL (ref 8.4–10.5)
Chloride: 103 mEq/L (ref 96–112)
Glucose, Bld: 86 mg/dL (ref 70–99)
Potassium: 4.2 mEq/L (ref 3.5–5.3)

## 2013-04-26 LAB — LIPID PANEL
HDL: 47 mg/dL (ref >39)
Triglycerides: 142 mg/dL (ref <150)

## 2013-04-28 LAB — HIV-1 RNA QUANT-NO REFLEX-BLD
HIV 1 RNA Quant: <20 copies/mL (ref <20)
HIV-1 RNA Quant, Log: <1.3 {Log} (ref <1.30)

## 2013-05-10 ENCOUNTER — Encounter: Payer: Self-pay | Admitting: Internal Medicine

## 2013-05-10 ENCOUNTER — Ambulatory Visit (INDEPENDENT_AMBULATORY_CARE_PROVIDER_SITE_OTHER): Payer: 59 | Admitting: Internal Medicine

## 2013-05-10 VITALS — BP 126/85 | HR 71 | Temp 97.6°F | Wt 171.0 lb

## 2013-05-10 DIAGNOSIS — B20 Human immunodeficiency virus [HIV] disease: Secondary | ICD-10-CM

## 2013-05-10 NOTE — Progress Notes (Signed)
Patient ID: Craig Morales, male   DOB: 1956-11-17, 57 y.o.   MRN: [id number]          [facility] for Infectious Disease  Patient Active Problem List   Diagnosis Date Noted  . HIV DISEASE 02/12/2007    Priority: High  . Degenerative arthritis of spine 11/27/2011  . EPISTAXIS, RECURRENT 11/26/2010  . IRRITABLE BOWEL SYNDROME 05/23/2008  . HERPES LABIALIS 02/23/2007  . GERD 02/23/2007  . HERPES ZOSTER, UNCOMPLICATED 02/12/2007  . DEPRESSION 02/12/2007  . DEVIATED NASAL SEPTUM 02/12/2007  . ALLERGIC RHINITIS 02/12/2007  . GASTROENTERITIS 02/12/2007  . PSORIASIS 02/12/2007  . DIARRHEA 02/12/2007  . KNEE INJURY 02/12/2007    Patient's Medications  New Prescriptions   No medications on file  Previous Medications   ASCORBIC ACID (VITAMIN C WITH ROSE HIPS) 500 MG TABLET    Take 500 mg by mouth daily.   CETIRIZINE (ZYRTEC) 10 MG TABLET    Take 1 tablet (10 mg total) by mouth daily.   DEXTROMETHORPHAN-GUAIFENESIN (MUCINEX DM) 30-600 MG PER 12 HR TABLET    Take 1 tablet by mouth daily as needed.   EFAVIRENZ-EMTRICITABINE-TENOFOVIR (ATRIPLA) 600-200-300 MG PER TABLET    Take 1 tablet by mouth daily.   FLUTICASONE (FLONASE) 50 MCG/ACT NASAL SPRAY    Place 2 sprays into the nose daily.   MELATONIN 5 MG CAPS    Take 1 each by mouth at bedtime.   MISC NATURAL PRODUCTS (OSTEO BI-FLEX ADV JOINT SHIELD) TABS    Take 1 each by mouth daily.   MULTIPLE VITAMINS-MINERALS (CENTRUM SILVER ADULT 50+ PO)    Take 1 each by mouth daily.   NAPROXEN SODIUM (ANAPROX) 220 MG TABLET    Take 220 mg by mouth 2 (two) times daily with a meal.   OMEPRAZOLE (PRILOSEC) 40 MG CAPSULE    Take 1 capsule (40 mg total) by mouth 2 (two) times daily.   PREGABALIN (LYRICA) 75 MG CAPSULE    Take 75 mg by mouth daily.    VALACYCLOVIR (VALTREX) 500 MG TABLET       VITAMIN E 400 UNIT CAPSULE    Take 400 Units by mouth daily.  Modified Medications   No medications on file  Discontinued Medications   No medications on  file    Subjective: Craig Morales is in for his routine followup visit. As usual he never misses a single dose of his Atripla. He states that on a very rare occasion he'll feel a little bit groggy in the morning which he thinks may be due to his Atripla and Lyrica but he feels that overall he tolerates it very well.  Review of Systems: Pertinent items are noted in HPI.  Past Medical History  Diagnosis Date  . Seasonal allergies   . HIV (human immunodeficiency virus infection)     History  Substance Use Topics  . Smoking status: Never Smoker   . Smokeless tobacco: Never Used  . Alcohol Use: 3.5 oz/week    7 drink(s) per week    No family history on file.  Allergies  Allergen Reactions  . Hydrocodone     REACTION: SOB and chest pain    Objective: Temp: 97.6 F (36.4 C) (06/03 1559) Temp src: Oral (06/03 1559) BP: 126/85 mmHg (06/03 1559) Pulse Rate: 71 (06/03 1559)  General: He is in good spirits as usual Oral: No oropharyngeal lesions Skin: No rash Lungs: Clear Cor: Regular S1 and S2 no murmurs Mood and affect normal  Lab Results HIV  1 RNA Quant (copies/mL)  Date Value  04/26/2013 <20   10/20/2012 <20   05/20/2012 <20      CD4 T Cell Abs (cmm)  Date Value  04/26/2013 670   10/20/2012 770   05/20/2012 740      Assessment: His HIV infection remains under excellent control. He is tolerating Atripla well.  Plan: 1. Continue Atripla 2. Followup after lab work in 6 months   Cliffton Asters, MD [facility] for Infectious Disease [facility] [phone] pager   [phone] cell 05/10/2013, 4:26 PM

## 2013-05-20 ENCOUNTER — Other Ambulatory Visit: Payer: Self-pay | Admitting: Internal Medicine

## 2013-05-20 MED ORDER — AZELASTINE HCL 0.15 % NA SOLN: 2.0000 | Freq: Every day | NASAL | Status: DC

## 2013-06-16 NOTE — Addendum Note (Signed)
Addended by: Jennet Maduro D on: 06/16/2013 03:35 PM   Modules accepted: Orders

## 2013-07-24 ENCOUNTER — Emergency Department (HOSPITAL_BASED_OUTPATIENT_CLINIC_OR_DEPARTMENT_OTHER): Payer: 59

## 2013-07-24 ENCOUNTER — Emergency Department (HOSPITAL_BASED_OUTPATIENT_CLINIC_OR_DEPARTMENT_OTHER)
Admission: EM | Admit: 2013-07-24 | Discharge: 2013-07-24 | Disposition: A | Discharge status: Home / Self Care | Payer: 59 | Attending: Emergency Medicine | Admitting: Emergency Medicine

## 2013-07-24 ENCOUNTER — Encounter (HOSPITAL_BASED_OUTPATIENT_CLINIC_OR_DEPARTMENT_OTHER): Payer: Self-pay | Admitting: *Deleted

## 2013-07-24 DIAGNOSIS — IMO0002 Reserved for concepts with insufficient information to code with codable children: Secondary | ICD-10-CM | POA: Insufficient documentation

## 2013-07-24 DIAGNOSIS — M25539 Pain in unspecified wrist: Secondary | ICD-10-CM | POA: Insufficient documentation

## 2013-07-24 DIAGNOSIS — M25532 Pain in left wrist: Secondary | ICD-10-CM

## 2013-07-24 DIAGNOSIS — Z791 Long term (current) use of non-steroidal anti-inflammatories (NSAID): Secondary | ICD-10-CM | POA: Insufficient documentation

## 2013-07-24 DIAGNOSIS — M25439 Effusion, unspecified wrist: Secondary | ICD-10-CM | POA: Insufficient documentation

## 2013-07-24 DIAGNOSIS — Z79899 Other long term (current) drug therapy: Secondary | ICD-10-CM | POA: Insufficient documentation

## 2013-07-24 DIAGNOSIS — Z21 Asymptomatic human immunodeficiency virus [HIV] infection status: Secondary | ICD-10-CM | POA: Insufficient documentation

## 2013-07-24 LAB — CBC WITH DIFFERENTIAL/PLATELET
Basophils Absolute: 0 K/uL (ref 0.0–0.1)
Basophils Relative: 0 % (ref 0–1)
Eosinophils Absolute: 0.1 K/uL (ref 0.0–0.7)
Eosinophils Relative: 1 % (ref 0–5)
HCT: 40.6 % (ref 39.0–52.0)
Hemoglobin: 14.1 g/dL (ref 13.0–17.0)
Lymphocytes Relative: 19 % (ref 12–46)
Lymphs Abs: 1.9 K/uL (ref 0.7–4.0)
MCH: 31.2 pg (ref 26.0–34.0)
MCHC: 34.7 g/dL (ref 30.0–36.0)
MCV: 89.8 fL (ref 78.0–100.0)
Monocytes Absolute: 1.2 K/uL — ABNORMAL HIGH (ref 0.1–1.0)
Monocytes Relative: 12 % (ref 3–12)
Neutro Abs: 7.1 K/uL (ref 1.7–7.7)
Neutrophils Relative %: 68 % (ref 43–77)
Platelets: 201 K/uL (ref 150–400)
RBC: 4.52 MIL/uL (ref 4.22–5.81)
RDW: 13.4 % (ref 11.5–15.5)
WBC: 10.3 K/uL (ref 4.0–10.5)

## 2013-07-24 LAB — SYNOVIAL CELL COUNT + DIFF, W/ CRYSTALS
Eosinophils-Synovial: 0 % (ref 0–1)
Lymphocytes-Synovial Fld: 1 % (ref 0–20)
Monocyte-Macrophage-Synovial Fluid: 3 % — ABNORMAL LOW (ref 50–90)
Neutrophil, Synovial: 96 % — ABNORMAL HIGH (ref 0–25)
WBC, Synovial: UNDETERMINED /mm3 (ref 0–200)

## 2013-07-24 LAB — URIC ACID: Uric Acid, Serum: 3.8 mg/dL — ABNORMAL LOW (ref 4.0–7.8)

## 2013-07-24 MED ORDER — IBUPROFEN 800 MG PO TABS
800.0000 mg | ORAL_TABLET | Freq: Once | ORAL | Status: AC
  Administered 2013-07-24: 800 mg via ORAL
  Filled 2013-07-24: qty 1

## 2013-07-24 MED ORDER — DEXTROSE 5 % IV SOLN
1.0000 g | Freq: Once | INTRAVENOUS | Status: AC
  Administered 2013-07-24: 1 g via INTRAVENOUS
  Filled 2013-07-24: qty 10

## 2013-07-24 MED ORDER — OXYCODONE-ACETAMINOPHEN 5-325 MG PO TABS
1.0000 | ORAL_TABLET | Freq: Once | ORAL | Status: AC
  Administered 2013-07-24: 1 via ORAL
  Filled 2013-07-24 (×2): qty 1

## 2013-07-24 MED ORDER — NAPROXEN 500 MG PO TABS: 500.0000 mg | ORAL_TABLET | Freq: Two times a day (BID) | ORAL | Status: DC

## 2013-07-24 MED ORDER — BENZOCAINE 20 % MT SOLN
OROMUCOSAL | Status: AC
  Filled 2013-07-24: qty 57

## 2013-07-24 NOTE — ED Provider Notes (Signed)
CSN: [id number]     Arrival date & time 07/24/13  6295 History     First MD Initiated Contact with Patient 07/24/13 1104     Chief Complaint  Patient presents with  . Wrist Pain   (Consider location/radiation/quality/duration/timing/severity/associated sxs/prior Treatment) HPI Pt presents with c/o left wrist pain and swelling.  Pt states symptoms began approx 3 days ago and have been increasing.  No fever/chills.  No hx of trauma or break in skin.  Pt has hx of HIV- he is on atripla.  Pain is worse with movement and palpation.  Has not had any treatment prior to arrival for his symptoms.  No hx of gout or other arthritis.  There are no other associated systemic symptoms, there are no other alleviating or modifying factors. CD4 count 3 months ago was 670.    Past Medical History  Diagnosis Date  . Seasonal allergies   . HIV (human immunodeficiency virus infection)    Past Surgical History  Procedure Laterality Date  . Knee surgery    . Nasal septum surgery     History reviewed. No pertinent family history. History  Substance Use Topics  . Smoking status: Never Smoker   . Smokeless tobacco: Never Used  . Alcohol Use: 3.5 oz/week    7 drink(s) per week    Review of Systems ROS reviewed and all otherwise negative except for mentioned in HPI  Allergies  Hydrocodone  Home Medications   Current Outpatient Rx  Name  Route  Sig  Dispense  Refill  . Ascorbic Acid (VITAMIN C WITH ROSE HIPS) 500 MG tablet   Oral   Take 500 mg by mouth daily.         . Azelastine HCl 0.15 % SOLN   Nasal   Place 2 sprays into the nose daily.   30 mL   11   . dextromethorphan-guaiFENesin (MUCINEX DM) 30-600 MG per 12 hr tablet   Oral   Take 1 tablet by mouth daily as needed.         Marland Kitchen efavirenz-emtricitabine-tenofovir (ATRIPLA) 600-200-300 MG per tablet   Oral   Take 1 tablet by mouth daily.   90 tablet   3   . fluticasone (FLONASE) 50 MCG/ACT nasal spray   Nasal   Place 2 sprays  into the nose daily.   48 g   3   . Melatonin 5 MG CAPS   Oral   Take 1 each by mouth at bedtime.         . Misc Natural Products (OSTEO BI-FLEX ADV JOINT SHIELD) TABS   Oral   Take 1 each by mouth daily.         . Multiple Vitamins-Minerals (CENTRUM SILVER ADULT 50+ PO)   Oral   Take 1 each by mouth daily.         . naproxen (NAPROSYN) 500 MG tablet   Oral   Take 1 tablet (500 mg total) by mouth 2 (two) times daily with a meal.   60 tablet   1   . naproxen sodium (ANAPROX) 220 MG tablet   Oral   Take 220 mg by mouth 2 (two) times daily with a meal.         . omeprazole (PRILOSEC) 40 MG capsule   Oral   Take 1 capsule (40 mg total) by mouth 2 (two) times daily.   180 capsule   3   . pregabalin (LYRICA) 75 MG capsule   Oral  Take 75 mg by mouth daily.          . valACYclovir (VALTREX) 500 MG tablet               . vitamin E 400 UNIT capsule   Oral   Take 400 Units by mouth daily.          BP 109/69  Pulse 63  Temp(Src) 97.8 F (36.6 C) (Oral)  Resp 16  Ht 6' (1.829 m)  Wt 168 lb (76.204 kg)  BMI 22.78 kg/m2  SpO2 100% Vitals reviewed Physical Exam Physical Examination: General appearance - alert, well appearing, and in no distress Mental status - alert, oriented to person, place, and time Eyes - no conjunctival injection, no scleral icterus Mouth - mucous membranes moist, pharynx normal without lesions Chest - clear to auscultation, no wheezes, rales or rhonchi, symmetric air entry Heart - normal rate, regular rhythm, normal S1, S2, no murmurs, rubs, clicks or gallops Musculoskeletal - left wrist with tenderness to palpation, swelling/tenderness/pain on ROM, otherwise no joint tenderness, deformity or swelling Extremities - peripheral pulses normal, no pedal edema, no clubbing or cyanosis Skin - normal coloration and turgor, no rashes except erythema and warmth overlying left wrist  ED Course   Procedures (including critical care  time)  1:42 PM d/w hand surgery, Dr. Janee Morn, he is going to evaluate patient in the ED.    Labs Reviewed  CBC WITH DIFFERENTIAL - Abnormal; Notable for the following:    Monocytes Absolute 1.2 (*)    All other components within normal limits  URIC ACID - Abnormal; Notable for the following:    Uric Acid, Serum 3.8 (*)    All other components within normal limits  CELL COUNT + DIFF,  W/ CRYST-SYNVL FLD - Abnormal; Notable for the following:    Color, Synovial RED (*)    Appearance-Synovial TURBID (*)    Neutrophil, Synovial 96 (*)    Monocyte-Macrophage-Synovial Fluid 3 (*)    All other components within normal limits  BODY FLUID CULTURE  ANAEROBIC CULTURE   Dg Wrist Complete Left  07/24/2013   *RADIOLOGY REPORT*  Clinical Data: Wrist pain  LEFT WRIST - COMPLETE 3+ VIEW  Comparison: None.  Findings: Four views of the left wrist submitted.  No acute fracture or subluxation.  Narrowing of radiocarpal joint space. There is diffuse osteopenia.  There is a sclerotic appearance basilar and proximal aspect of the scaphoid.  Mild cystic changes noted mid aspect of the scaphoid.  Mild cortical irregularity lateral aspect of the scaphoid.  Although findings may be degenerative in nature prior scaphoid fracture cannot be excluded. Clinical correlation is necessary. Further evaluation with MRI could be performed as clinically warranted.  Sclerotic degenerative changes noted anterior articular surface of the lunate. Degenerative changes are noted first carpal metacarpal joint. There is soft tissue swelling dorsal carpal region best seen on lateral view.  IMPRESSION:   No acute fracture or subluxation.  Narrowing of radiocarpal joint space.  There is diffuse osteopenia.  There is a sclerotic appearance basilar and proximal aspect of the scaphoid.  Mild cystic changes noted mid aspect of the scaphoid.  Mild cortical irregularity lateral aspect of the scaphoid.  Although findings may be degenerative in nature  prior scaphoid fracture cannot be excluded.  Clinical correlation is necessary. Further evaluation with MRI could be performed as clinically warranted.  Sclerotic degenerative changes noted anterior articular surface of the lunate.  Degenerative changes are noted first carpal metacarpal joint.  There is soft tissue swelling dorsal carpal region best seen on lateral view.   Original Report Authenticated By: Natasha Mead, M.D.   1. Wrist pain, acute, left     MDM  Pt presenting with c/o pain in left wrist.  Joint appears swollen, red and is tender.  No fever or other systemic symptoms.  WBC reassuring.  D/w Dr. Janee Morn, hand surgeon- he has seen pateint in the ED, has performed joint aspiration and given rocephin x 1 dose.  He will follow up with patient later today after synovial fluid results available.  D/c instructions written by Dr. Janee Morn and given to patient.  Pt has wrist splint and declines one from the ED.   Ethelda Chick, MD 07/25/13 1400

## 2013-07-24 NOTE — Consult Note (Signed)
ORTHOPAEDIC CONSULTATION HISTORY & PHYSICAL REQUESTING PHYSICIAN: Ethelda Chick, MD  Chief Complaint: 2-3 day h/o left wrist pain/swelling  HPI: Craig Morales is a 57 y.o. male who complains of  Left wrist pain/swelling.  Started 2-3 days ago, swelling of digits was worse yesterday.  No recent trauma.  Has had some prior bouts of wrist pain, but not like this.  Does not remember a single prior trauma that could have fractured his scaphoid.  No known h/o gout, etc.  Not anticoagulated.  Past Medical History  Diagnosis Date  . Seasonal allergies   . HIV (human immunodeficiency virus infection)    Past Surgical History  Procedure Laterality Date  . Knee surgery    . Nasal septum surgery     History   Social History  . Marital Status: Single    Spouse Name: N/A    Number of Children: N/A  . Years of Education: N/A   Social History Main Topics  . Smoking status: Never Smoker   . Smokeless tobacco: Never Used  . Alcohol Use: 3.5 oz/week    7 drink(s) per week  . Drug Use: No  . Sexual Activity: Not Currently    Birth Control/ Protection: None     Comment: given condoms   Other Topics Concern  . None   Social History Narrative  . None   History reviewed. No pertinent family history. Allergies  Allergen Reactions  . Hydrocodone     REACTION: SOB and chest pain   Prior to Admission medications   Medication Sig Start Date End Date Taking? Authorizing Provider  Ascorbic Acid (VITAMIN C WITH ROSE HIPS) 500 MG tablet Take 500 mg by mouth daily.    Historical Provider, MD  Azelastine HCl 0.15 % SOLN Place 2 sprays into the nose daily. 05/20/13   Cliffton Asters, MD  dextromethorphan-guaiFENesin [facility] DM) 30-600 MG per 12 hr tablet Take 1 tablet by mouth daily as needed. 05/14/11   Cliffton Asters, MD  efavirenz-emtricitabine-tenofovir (ATRIPLA) 600-200-300 MG per tablet Take 1 tablet by mouth daily. 02/07/13   Cliffton Asters, MD  fluticasone (FLONASE) 50 MCG/ACT nasal spray Place  2 sprays into the nose daily. 10/05/12 10/26/14  Edwyna Perfect, MD  Melatonin 5 MG CAPS Take 1 each by mouth at bedtime.    Historical Provider, MD  Misc Natural Products (OSTEO BI-FLEX ADV JOINT SHIELD) TABS Take 1 each by mouth daily.    Historical Provider, MD  Multiple Vitamins-Minerals (CENTRUM SILVER ADULT 50+ PO) Take 1 each by mouth daily.    Historical Provider, MD  naproxen (NAPROSYN) 500 MG tablet Take 1 tablet (500 mg total) by mouth 2 (two) times daily with a meal. 07/24/13   Jodi Marble, MD  naproxen sodium (ANAPROX) 220 MG tablet Take 220 mg by mouth 2 (two) times daily with a meal.    Historical Provider, MD  omeprazole (PRILOSEC) 40 MG capsule Take 1 capsule (40 mg total) by mouth 2 (two) times daily. 10/05/12   Edwyna Perfect, MD  pregabalin (LYRICA) 75 MG capsule Take 75 mg by mouth daily.  09/24/11   Historical Provider, MD  valACYclovir (VALTREX) 500 MG tablet  06/07/12   Cliffton Asters, MD  vitamin E 400 UNIT capsule Take 400 Units by mouth daily.    Historical Provider, MD   Dg Wrist Complete Left  07/24/2013   *RADIOLOGY REPORT*  Clinical Data: Wrist pain  LEFT WRIST - COMPLETE 3+ VIEW  Comparison: None.  Findings: Four  views of the left wrist submitted.  No acute fracture or subluxation.  Narrowing of radiocarpal joint space. There is diffuse osteopenia.  There is a sclerotic appearance basilar and proximal aspect of the scaphoid.  Mild cystic changes noted mid aspect of the scaphoid.  Mild cortical irregularity lateral aspect of the scaphoid.  Although findings may be degenerative in nature prior scaphoid fracture cannot be excluded. Clinical correlation is necessary. Further evaluation with MRI could be performed as clinically warranted.  Sclerotic degenerative changes noted anterior articular surface of the lunate. Degenerative changes are noted first carpal metacarpal joint. There is soft tissue swelling dorsal carpal region best seen on lateral view.  IMPRESSION:   No  acute fracture or subluxation.  Narrowing of radiocarpal joint space.  There is diffuse osteopenia.  There is a sclerotic appearance basilar and proximal aspect of the scaphoid.  Mild cystic changes noted mid aspect of the scaphoid.  Mild cortical irregularity lateral aspect of the scaphoid.  Although findings may be degenerative in nature prior scaphoid fracture cannot be excluded.  Clinical correlation is necessary. Further evaluation with MRI could be performed as clinically warranted.  Sclerotic degenerative changes noted anterior articular surface of the lunate.  Degenerative changes are noted first carpal metacarpal joint.  There is soft tissue swelling dorsal carpal region best seen on lateral view.   Original Report Authenticated By: Natasha Mead, M.D.    Positive ROS: All other systems have been reviewed and were otherwise negative with the exception of those mentioned in the HPI and as above.  Physical Exam: Vitals: Refer to EMR. Constitutional:  WD, WN, NAD HEENT:  NCAT, EOMI Neuro/Psych:  Alert & oriented to person, place, and time; appropriate mood & affect Lymphatic: No generalized UE edema or lymphadenopathy Extremities / MSK:  The extremities are normal with respect to appearance, ranges of motion, joint stability, muscle strength/tone, sensation, & perfusion except as otherwise noted:   left wrist swollen, tender, warm, appears to have effusion.  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 Wrist splint I will call patient later today regarding plan once some lab data becomes available.  Out of caution, given 1 dose of rocephin here, and rx for Naproxen.  Cliffton Asters Janee Morn, MD     Mobile  [phone] Orthopaedic & Hand Surgery [facility] [address] [place], [place]  [address] [phone]

## 2013-07-24 NOTE — ED Notes (Signed)
Pt c/o left wrist pain , redness and swelling x 3 days denies injury

## 2013-07-24 NOTE — ED Notes (Signed)
MD at bedside. 

## 2013-07-25 ENCOUNTER — Encounter: Payer: Self-pay | Admitting: Internal Medicine

## 2013-07-25 ENCOUNTER — Encounter: Payer: Self-pay | Admitting: Physician Assistant

## 2013-07-25 ENCOUNTER — Ambulatory Visit (INDEPENDENT_AMBULATORY_CARE_PROVIDER_SITE_OTHER): Payer: 59 | Admitting: Physician Assistant

## 2013-07-25 VITALS — BP 108/72 | HR 67 | Temp 98.7°F | Resp 16 | Ht 72.0 in | Wt 167.8 lb

## 2013-07-25 DIAGNOSIS — M11849 Other specified crystal arthropathies, unspecified hand: Secondary | ICD-10-CM

## 2013-07-25 DIAGNOSIS — M11242 Other chondrocalcinosis, left hand: Secondary | ICD-10-CM | POA: Insufficient documentation

## 2013-07-25 DIAGNOSIS — Z8739 Personal history of other diseases of the musculoskeletal system and connective tissue: Secondary | ICD-10-CM | POA: Insufficient documentation

## 2013-07-25 NOTE — Patient Instructions (Signed)
Please take prednisone as prescribed by Hand Surgeon until all pills are gone.  As you are coming off of steroids, you can take your naproxen for pain.  Apply ice compresses to wrist.  I want to see you in 1.5-2 weeks.  (if things are normal you can cancel/change appointment)  Please call or return sooner if symptoms worsen or if you develop new symptoms.  If you develop fever, chills  Pseudogout Pseudogout is similar to gout. It is an arthritis that causes pain, swelling, and inflammation in a joint. This is due to the presence of calcium pyrophosphate crystals in the joint fluid. This is a different type of crystal than the crystals that cause gout. The joint pain can be severe and may last for days. In some cases, it may last much longer and can mimic rheumatoid arthritis or osteoarthritis. CAUSES  The exact cause of the disease is not known. It develops when crystals of calcium pyrophosphate build up in a joint. These crystals cause inflammation that leads to pain and swelling of the joint.  Chances of developing pseudogout increase with age. It often follows a minor injury.  The condition may be passed down from parent to child (hereditary).  Events such as strokes, heart attacks, or surgery may increase the risk of pseudogout.  Pseudogout can be associated with other disorders (hemophilia, ochronosis, amyloidosis, or hormonal disorders).  Pseudogout can be associated with dehydration, especially following surgery or hospitalization. Patients with known pseudogout should stay well hydrated before and after surgery. SYMPTOMS   Intense, constant pain in one joint that seems to come on for no reason.  The joint area may be hot to the touch, red, swollen, and stiff.  Pain may last from several days to a few weeks. It may then disappear. Later, it may start again, possibly in a different joint.  Pseudogout usually affects the knees. It can also affect the wrists, elbows, shoulders, and  ankles. DIAGNOSIS  The diagnosis is often suggested by your exam or is suspected when an abnormal buildup of calcium salts (calcifications) are seen in the cartilage of joints on X-rays. The final diagnosis is made when fluid from the joint is examined under a special microscope used to find calcium pyrophosphate crystals. The crystals of pseudogout and gout may both be present at the same time. TREATMENT  Nonsteroidal anti-inflammatory drugs (NSAIDs), such as naproxen, treat the pain. Identifying the trigger of pseudogout and treating the underlying cause, such as dehydration, is also important. There is no way to remove the crystals themselves. HOME CARE INSTRUCTIONS   Put ice on the sore joint.  Put ice in a plastic bag.  Place a towel between your skin and the bag.  Leave the ice on for 15-20 minutes at a time, 3-4 times a day, for the first 2 days.  Keep your affected joints raised (elevated) when possible to lessen swelling.  Use crutches (non-weight bearing) as needed. Walk without crutches as the pain allows, or as instructed. Gradually, start bearing weight.  Only take over-the-counter or prescription medicines for pain, discomfort, or fever as directed by your caregiver.  Once you recover from the painful attack, exercise regularly to keep your muscle strength. Not using a sore joint will cause the muscles around it to become weak. This may increase pain. Low-impact exercises, such as swimming, bicycling, water aerobics, and walking, may be best. This will give you energy, strengthen your heart, help you control your weight, and improve your well-being.  Maintain  a healthy weight so your joints do not need to bear more weight than necessary. SEEK MEDICAL CARE IF:   You have an increase in joint pain not relieved with medicine.  You have a fever.  You have more serious symptoms such as skin rash, diarrhea, vomiting, headache, or other joint pains. FOR MORE INFORMATION   Arthritis Foundation: www.arthritis.[facility] Skin Diseases: www.niams.http://www.myers.net/ Document Released: 08/16/2004 Document Revised: 02/16/2012 Document Reviewed: 03/08/2010 [facility] Patient Information 2014 [place], [place].

## 2013-07-25 NOTE — Assessment & Plan Note (Addendum)
1st episode.  Prednisone taper as prescribed by Orthopedic Surgeon.  May restart Naproxen after d/c prednisone if needed.  Ice/Cold compresses to area every 4-6 hours.  Follow-up in 1.5-2 weeks.

## 2013-07-25 NOTE — Progress Notes (Signed)
Patient ID: Craig Morales, male   DOB: Apr 08, 1956, 57 y.o.   MRN: [id number]   Patient is a 57 year-old caucasian male who presents to clinic today for ED follow-up for L wrist pain.  Patient was seen in ED on 07/24/13 and diagnosed with pseudogout.  Patient was given Naproxen and told to follow-up with PCP.  Patient states that wrist was evaluated by a hand surgeon while in ED, who aspirated the joint.  Aspirate revealed calcium pyrophosphate crystals within the aspirate.  Surgeon gave patient prescription for Prednisone dose pack.  Since ED visit, pain has decreased, with increase in ROM of fingers.  Swelling has been markedly reduced following aspiration.  Erythema, tenderness and warmth are still present but improved from yesterday.  Patient has been taking Naproxen 500 mg BID since ED visit.  Has not picked up prescription for Prednisone.  Patient denies fever, chills, sweats.  Denies history of pseudogout.  Denies history of rheumatologic condition.  Past Medical History  Diagnosis Date  . Seasonal allergies   . HIV (human immunodeficiency virus infection)    Current Outpatient Prescriptions on File Prior to Visit  Medication Sig Dispense Refill  . Ascorbic Acid (VITAMIN C WITH ROSE HIPS) 500 MG tablet Take 500 mg by mouth daily.      . Azelastine HCl 0.15 % SOLN Place 2 sprays into the nose daily.  30 mL  11  . dextromethorphan-guaiFENesin (MUCINEX DM) 30-600 MG per 12 hr tablet Take 1 tablet by mouth daily as needed.      Marland Kitchen efavirenz-emtricitabine-tenofovir (ATRIPLA) 600-200-300 MG per tablet Take 1 tablet by mouth daily.  90 tablet  3  . fluticasone (FLONASE) 50 MCG/ACT nasal spray Place 2 sprays into the nose daily.  48 g  3  . Melatonin 5 MG CAPS Take 1 each by mouth at bedtime.      . Misc Natural Products (OSTEO BI-FLEX ADV JOINT SHIELD) TABS Take 1 each by mouth daily.      . Multiple Vitamins-Minerals (CENTRUM SILVER ADULT 50+ PO) Take 1 each by mouth daily.      . naproxen  (NAPROSYN) 500 MG tablet Take 1 tablet (500 mg total) by mouth 2 (two) times daily with a meal.  60 tablet  1  . naproxen sodium (ANAPROX) 220 MG tablet Take 220 mg by mouth 2 (two) times daily with a meal.      . omeprazole (PRILOSEC) 40 MG capsule Take 1 capsule (40 mg total) by mouth 2 (two) times daily.  180 capsule  3  . pregabalin (LYRICA) 75 MG capsule Take 75 mg by mouth daily.       . valACYclovir (VALTREX) 500 MG tablet Take by mouth as needed.       . vitamin E 400 UNIT capsule Take 400 Units by mouth daily.       No current facility-administered medications on file prior to visit.   Allergies  Allergen Reactions  . Oxycontin [Oxycodone Hcl Er] Shortness Of Breath and Palpitations  . Hydrocodone     REACTION: SOB and chest pain   No family history on file. History   Social History  . Marital Status: Single    Spouse Name: N/A    Number of Children: N/A  . Years of Education: N/A   Social History Main Topics  . Smoking status: Never Smoker   . Smokeless tobacco: Never Used  . Alcohol Use: 3.5 oz/week    7 drink(s) per week  . Drug  Use: No  . Sexual Activity: Not Currently    Birth Control/ Protection: None     Comment: given condoms   Other Topics Concern  . None   Social History Narrative  . None   Review of Systems  Constitutional: Negative for fever, chills and malaise/fatigue.  Cardiovascular: Negative for chest pain and palpitations.  Gastrointestinal: Negative for nausea and vomiting.  Musculoskeletal: Positive for joint pain. Negative for myalgias.  Neurological: Negative for tingling and sensory change.   Filed Vitals:   07/25/13 1439  BP: 108/72  Pulse: 67  Temp: 98.7 F (37.1 C)  Resp: 16   Physical Exam  Vitals reviewed. Constitutional: He is oriented to person, place, and time and well-developed, well-nourished, and in no distress.  HENT:  Head: Normocephalic and atraumatic.  Cardiovascular: Normal rate, regular rhythm, normal heart  sounds and intact distal pulses.   Pulmonary/Chest: Effort normal and breath sounds normal. No respiratory distress. He has no wheezes. He has no rales. He exhibits no tenderness.  Musculoskeletal:  Presence of warm, tender area over first MCP joint of L wrist with mild swelling.  ROM of L wrist is normal, ROM of thumb is slightly decreased 2/2 swelling.  ROM of other phalanges of L hand WNL.  Neurological: He is alert and oriented to person, place, and time.  Skin: Skin is warm and dry. No rash noted.   Assessment/Plan: Pseudogout of joint of left hand 1st episode.  Prednisone taper as prescribed by Orthopedic Surgeon.  May restart Naproxen after d/c prednisone if needed.  Ice/Cold compresses to area every 4-6 hours.  Follow-up in 1.5-2 weeks.

## 2013-07-26 ENCOUNTER — Telehealth: Payer: Self-pay | Admitting: *Deleted

## 2013-07-26 NOTE — Telephone Encounter (Signed)
Patient informed, understood & agreed/SLS  

## 2013-07-26 NOTE — Telephone Encounter (Signed)
Patient reports that he was given Naproxen 500 mg for his Wrist injury/pain when seen ion ED 08.17.14; was seen in office 08.18.14 for ED follow-up & has since begun to have worsening "aching pain" and states that he cannot take the Naproxen with Prednisone, has been taking Tylenol but not sufficient. Request to have new medication px that is stronger for pain control/SLS Please Advise.

## 2013-07-26 NOTE — Telephone Encounter (Signed)
Please inform patient that it is ok to take his naproxen with the prednisone, but I would like for him to take it with food -- not on an empty stomach.  Because his condition is inflammatory, the naproxen will give him better relief vs another type of pain reliever.  Hopefully he will not require many days of the naproxen once the steroid has taken good effect.

## 2013-07-27 LAB — BODY FLUID CULTURE: Culture: NO GROWTH

## 2013-07-29 LAB — ANAEROBIC CULTURE

## 2013-08-10 ENCOUNTER — Ambulatory Visit: Payer: 59 | Admitting: Physician Assistant

## 2013-08-16 ENCOUNTER — Telehealth: Payer: Self-pay | Admitting: Physician Assistant

## 2013-08-16 DIAGNOSIS — Z8739 Personal history of other diseases of the musculoskeletal system and connective tissue: Secondary | ICD-10-CM

## 2013-08-16 NOTE — Telephone Encounter (Signed)
Craig Morales had a follow-up appointment scheduled with me 1.5-2 weeks after initial ED follow-up, but changed appointment or canceled.  I am not sure if his condition persisted or if he thinks he has had a recurrence of pseudogout.  I will go ahead and make the referral to the requested provider.  He should here from there office soon, but if a problem or concern has developed that cannot wait until that appointment, we would be happy to see him.

## 2013-08-16 NOTE — Telephone Encounter (Signed)
Patient request a referral to  Rhumatolgy  For Pseudogout of joints,  States he would like to be referred to Dr Kellie Simmering, ,call pt on cell

## 2013-08-16 NOTE — Telephone Encounter (Signed)
Please Advise

## 2013-08-17 NOTE — Telephone Encounter (Signed)
Patient informed, understood & agreed/SLS  

## 2013-09-26 ENCOUNTER — Encounter: Payer: Self-pay | Admitting: Physician Assistant

## 2013-10-04 ENCOUNTER — Telehealth: Payer: Self-pay | Admitting: *Deleted

## 2013-10-04 NOTE — Telephone Encounter (Signed)
Pt called wanting to know what was to be expected for his upcoming cpe.  Advised pt evaluation would be completed, screening tests (colonoscopy, ekg, urine, cxr, labs, immunization, etc. . . .) will be ordered based on what pt needs at present. Pt voices understanding.

## 2013-10-07 ENCOUNTER — Encounter: Payer: 59 | Admitting: Internal Medicine

## 2013-10-07 ENCOUNTER — Encounter: Payer: Self-pay | Admitting: Physician Assistant

## 2013-10-07 ENCOUNTER — Ambulatory Visit (INDEPENDENT_AMBULATORY_CARE_PROVIDER_SITE_OTHER): Payer: 59 | Admitting: Physician Assistant

## 2013-10-07 VITALS — BP 120/84 | HR 63 | Temp 97.5°F | Resp 16 | Ht 72.0 in | Wt 166.8 lb

## 2013-10-07 DIAGNOSIS — Z23 Encounter for immunization: Secondary | ICD-10-CM

## 2013-10-07 DIAGNOSIS — M109 Gout, unspecified: Secondary | ICD-10-CM

## 2013-10-07 DIAGNOSIS — K219 Gastro-esophageal reflux disease without esophagitis: Secondary | ICD-10-CM

## 2013-10-07 DIAGNOSIS — Z0000 Encounter for general adult medical examination without abnormal findings: Secondary | ICD-10-CM

## 2013-10-07 DIAGNOSIS — B20 Human immunodeficiency virus [HIV] disease: Secondary | ICD-10-CM

## 2013-10-07 LAB — CBC WITH DIFFERENTIAL/PLATELET
Basophils Relative: 1 % (ref 0–1)
Eosinophils Relative: 2 % (ref 0–5)
HCT: 42.5 % (ref 39.0–52.0)
Hemoglobin: 14.8 g/dL (ref 13.0–17.0)
MCH: 31.4 pg (ref 26.0–34.0)
MCHC: 34.8 g/dL (ref 30.0–36.0)
MCV: 90.2 fL (ref 78.0–100.0)
Monocytes Absolute: 0.7 10*3/uL (ref 0.1–1.0)
Monocytes Relative: 10 % (ref 3–12)
Neutro Abs: 3.6 10*3/uL (ref 1.7–7.7)

## 2013-10-07 LAB — LIPID PANEL: HDL: 57 mg/dL (ref >39)

## 2013-10-07 LAB — BASIC METABOLIC PANEL
BUN: 15 mg/dL (ref 6–23)
Creat: 0.88 mg/dL (ref 0.50–1.35)
Glucose, Bld: 84 mg/dL (ref 70–99)
Potassium: 4.1 mEq/L (ref 3.5–5.3)

## 2013-10-07 LAB — HEPATIC FUNCTION PANEL
ALT: 15 U/L (ref 0–53)
AST: 20 U/L (ref 0–37)
Albumin: 4.1 g/dL (ref 3.5–5.2)
Total Bilirubin: 0.4 mg/dL (ref 0.3–1.2)

## 2013-10-07 LAB — PSA: PSA: 0.46 ng/mL (ref <=4.00)

## 2013-10-07 LAB — TSH: TSH: 1.897 u[IU]/mL (ref 0.350–4.500)

## 2013-10-07 NOTE — Patient Instructions (Signed)
Please obtain labs.  I will call you with your results.  Please return to clinic when you need [place].  It was a pleasure taking care of you today.

## 2013-10-07 NOTE — Addendum Note (Signed)
Addended by: Regis Bill on: 10/07/2013 12:44 PM   Modules accepted: Orders

## 2013-10-07 NOTE — Assessment & Plan Note (Signed)
Patient seen by Rheumatology.  Continue medications as prescribed.  Follow-up as scheduled.

## 2013-10-07 NOTE — Assessment & Plan Note (Addendum)
Will obtain fasting labs. TDaP given.  Patient has already had flu shot this year.

## 2013-10-07 NOTE — Progress Notes (Signed)
Patient ID: Craig Morales, male   DOB: 04-17-1956, 57 y.o.   MRN: [id number]  Patient presents to clinic today for annual exam.  Acute Concerns: Patient denies any acute concerns at today's visit.  Chronic Issues: (1) Gout -- followed by Rheumatology. Rx for Prednisone and Colcrys.  Has follow-up in 2 weeks. (2) GERD -- patient sees GI.  Symptoms controlled with Dexilant 60mg  QD.  Occasional breakthrough symptoms with spicy foods and heavy meals.  Is watching his diet. (3) HIV -- Followed by ID.  Taking medications as prescribed.  Has follow-up in Nov/Dec.  Health Maintenance: Dental -- up-to-date Vision -- up-to-date Immunizations -- up-to-date.  Has had flu shot. Colonoscopy -- due in 2017.  Past Medical History  Diagnosis Date  . Seasonal allergies   . HIV (human immunodeficiency virus infection)     Current Outpatient Prescriptions on File Prior to Visit  Medication Sig Dispense Refill  . Ascorbic Acid (VITAMIN C WITH ROSE HIPS) 500 MG tablet Take 500 mg by mouth daily.      . Azelastine HCl 0.15 % SOLN Place 2 sprays into the nose daily.  30 mL  11  . dextromethorphan-guaiFENesin (MUCINEX DM) 30-600 MG per 12 hr tablet Take 1 tablet by mouth daily as needed.      Marland Kitchen efavirenz-emtricitabine-tenofovir (ATRIPLA) 600-200-300 MG per tablet Take 1 tablet by mouth daily.  90 tablet  3  . fluticasone (FLONASE) 50 MCG/ACT nasal spray Place 2 sprays into the nose daily.  48 g  3  . KRILL OIL OMEGA-3 PO Take 1 each by mouth daily.      . Melatonin 5 MG CAPS Take 1 each by mouth at bedtime.      . Misc Natural Products (OSTEO BI-FLEX ADV JOINT SHIELD) TABS Take 1 each by mouth daily.      . Multiple Vitamins-Minerals (CENTRUM SILVER ADULT 50+ PO) Take 1 each by mouth daily.      . naproxen sodium (ANAPROX) 220 MG tablet Take 220 mg by mouth 2 (two) times daily with a meal.      . pregabalin (LYRICA) 75 MG capsule Take 75 mg by mouth daily.       . valACYclovir (VALTREX) 500 MG tablet Take  by mouth as needed.       . vitamin E 400 UNIT capsule Take 400 Units by mouth daily.       No current facility-administered medications on file prior to visit.    Allergies  Allergen Reactions  . Oxycontin [Oxycodone Hcl] Shortness Of Breath and Palpitations  . Hydrocodone     REACTION: SOB and chest pain    No family history on file.  History   Social History  . Marital Status: Single    Spouse Name: N/A    Number of Children: N/A  . Years of Education: N/A   Social History Main Topics  . Smoking status: Never Smoker   . Smokeless tobacco: Never Used  . Alcohol Use: 3.5 oz/week    7 drink(s) per week  . Drug Use: No  . Sexual Activity: Not Currently    Birth Control/ Protection: None     Comment: given condoms   Other Topics Concern  . None   Social History Narrative  . None   Review of Systems  Constitutional: Negative for fever, chills, weight loss and malaise/fatigue.  HENT: Negative for ear discharge, ear pain, hearing loss and tinnitus.   Eyes: Negative for blurred vision, double vision, photophobia and  pain.  Respiratory: Negative for cough, shortness of breath and wheezing.   Cardiovascular: Negative for chest pain and palpitations.  Gastrointestinal: Positive for heartburn. Negative for nausea, vomiting, abdominal pain, diarrhea, constipation, blood in stool and melena.  Genitourinary: Negative for dysuria, urgency, frequency, hematuria and flank pain.  Musculoskeletal: Negative for myalgias.  Neurological: Negative for dizziness, seizures, loss of consciousness and headaches.  Endo/Heme/Allergies: Positive for environmental allergies.  Psychiatric/Behavioral: Negative for depression. The patient is not nervous/anxious.    Filed Vitals:   10/07/13 0836  BP: 120/84  Pulse: 63  Temp: 97.5 F (36.4 C)  Resp: 16   Physical Exam  Vitals reviewed. Constitutional: He is oriented to person, place, and time and well-developed, well-nourished, and in no  distress.  HENT:  Head: Normocephalic and atraumatic.  Right Ear: External ear normal.  Left Ear: External ear normal.  Nose: Nose normal.  Mouth/Throat: Oropharynx is clear and moist. No oropharyngeal exudate.  Tympanic membranes within normal limits bilaterally.  Eyes: Conjunctivae and EOM are normal. Pupils are equal, round, and reactive to light. Right eye exhibits no discharge. Left eye exhibits no discharge. No scleral icterus.  Neck: Neck supple.  Cardiovascular: Normal rate, regular rhythm, normal heart sounds and intact distal pulses.   Pulmonary/Chest: Effort normal and breath sounds normal. No respiratory distress. He has no wheezes. He has no rales. He exhibits no tenderness.  Abdominal: Soft. Bowel sounds are normal. He exhibits no distension and no mass. There is no tenderness. There is no rebound and no guarding.  Lymphadenopathy:    He has no cervical adenopathy.  Neurological: He is alert and oriented to person, place, and time. No cranial nerve deficit.  Skin: Skin is warm and dry. No rash noted.  Psychiatric: Affect normal.   Recent Results (from the past 2160 hour(s))  CBC WITH DIFFERENTIAL     Status: Abnormal   Collection Time    07/24/13 11:37 AM      Result Value Range   WBC 10.3  4.0 - 10.5 K/uL   RBC 4.52  4.22 - 5.81 MIL/uL   Hemoglobin 14.1  13.0 - 17.0 g/dL   HCT 16.1  09.6 - 04.5 %   MCV 89.8  78.0 - 100.0 fL   MCH 31.2  26.0 - 34.0 pg   MCHC 34.7  30.0 - 36.0 g/dL   RDW 40.9  81.1 - 91.4 %   Platelets 201  150 - 400 K/uL   Neutrophils Relative % 68  43 - 77 %   Neutro Abs 7.1  1.7 - 7.7 K/uL   Lymphocytes Relative 19  12 - 46 %   Lymphs Abs 1.9  0.7 - 4.0 K/uL   Monocytes Relative 12  3 - 12 %   Monocytes Absolute 1.2 (*) 0.1 - 1.0 K/uL   Eosinophils Relative 1  0 - 5 %   Eosinophils Absolute 0.1  0.0 - 0.7 K/uL   Basophils Relative 0  0 - 1 %   Basophils Absolute 0.0  0.0 - 0.1 K/uL  URIC ACID     Status: Abnormal   Collection Time     07/24/13 11:37 AM      Result Value Range   Uric Acid, Serum 3.8 (*) 4.0 - 7.8 mg/dL  CELL COUNT + DIFF,  W/ CRYST-SYNVL FLD     Status: Abnormal   Collection Time    07/24/13  2:00 PM      Result Value Range   Color, Synovial RED (*)  YELLOW   Appearance-Synovial TURBID (*) CLEAR   Crystals, Fluid INTRACELLULAR CALCIUM PYROPHOSPHATE CRYSTALS     WBC, Synovial QUANTITY NOT SUFFICIENT, UNABLE TO PERFORM TEST  0 - 200 /cu mm   Neutrophil, Synovial 96 (*) 0 - 25 %   Lymphocytes-Synovial Fld 1  0 - 20 %   Monocyte-Macrophage-Synovial Fluid 3 (*) 50 - 90 %   Eosinophils-Synovial 0  0 - 1 %   Other Cells-SYN PYKNOTIC NEUTROPHILS     Comment: Performed at [facility]  BODY FLUID CULTURE     Status: None   Collection Time    07/24/13  2:00 PM      Result Value Range   Specimen Description ASPIRATE LEFT WRIST     Special Requests NONE     Gram Stain       Value: NO WBC SEEN     NO ORGANISMS SEEN     Performed at [facility]   Culture       Value: NO GROWTH 3 DAYS     Performed at [facility]   Report Status 07/27/2013 FINAL    ANAEROBIC CULTURE     Status: None   Collection Time    07/24/13  2:00 PM      Result Value Range   Specimen Description ASPIRATE LEFT WRIST     Special Requests NONE     Gram Stain       Value: ABUNDANT WBC PRESENT, PREDOMINANTLY PMN     NO SQUAMOUS EPITHELIAL CELLS SEEN     NO ORGANISMS SEEN     Performed at [facility]   Culture       Value: NO ANAEROBES ISOLATED     Performed at [facility]   Report Status 07/29/2013 FINAL     Assessment/Plan: GERD Continue current management.  Follow-up with GI as scheduled.  HIV DISEASE Continue current regimen.  Follow-up with ID as scheduled.  Gout Patient seen by Rheumatology.  Continue medications as prescribed.  Follow-up as scheduled.  Visit for preventive health examination Will obtain fasting labs. TDaP given.  Patient has already had flu shot this  year.

## 2013-10-07 NOTE — Assessment & Plan Note (Signed)
Continue current management.  Follow-up with GI as scheduled.

## 2013-10-07 NOTE — Assessment & Plan Note (Signed)
Continue current regimen.  Follow-up with ID as scheduled.

## 2013-10-08 LAB — URINALYSIS, ROUTINE W REFLEX MICROSCOPIC
Glucose, UA: NEGATIVE mg/dL
Leukocytes, UA: NEGATIVE
pH: 5.5 (ref 5.0–8.0)

## 2013-10-08 LAB — URINALYSIS, MICROSCOPIC ONLY
Casts: NONE SEEN
Crystals: NONE SEEN

## 2013-11-10 ENCOUNTER — Other Ambulatory Visit: Payer: 59

## 2013-11-10 DIAGNOSIS — B20 Human immunodeficiency virus [HIV] disease: Secondary | ICD-10-CM

## 2013-11-10 LAB — CBC
Hemoglobin: 14.9 g/dL (ref 13.0–17.0)
MCH: 31.6 pg (ref 26.0–34.0)
MCV: 90 fL (ref 78.0–100.0)
RBC: 4.72 MIL/uL (ref 4.22–5.81)

## 2013-11-10 LAB — COMPREHENSIVE METABOLIC PANEL
CO2: 29 mEq/L (ref 19–32)
Creat: 0.94 mg/dL (ref 0.50–1.35)
Glucose, Bld: 87 mg/dL (ref 70–99)
Sodium: 140 mEq/L (ref 135–145)
Total Bilirubin: 0.3 mg/dL (ref 0.3–1.2)
Total Protein: 7.4 g/dL (ref 6.0–8.3)

## 2013-11-11 LAB — T-HELPER CELL (CD4) - (RCID CLINIC ONLY)
CD4 % Helper T Cell: 28 % — ABNORMAL LOW (ref 33–55)
CD4 T Cell Abs: 500 /uL (ref 400–2700)

## 2013-11-24 ENCOUNTER — Ambulatory Visit (INDEPENDENT_AMBULATORY_CARE_PROVIDER_SITE_OTHER): Payer: 59 | Admitting: Internal Medicine

## 2013-11-24 ENCOUNTER — Encounter: Payer: Self-pay | Admitting: Internal Medicine

## 2013-11-24 VITALS — BP 112/71 | HR 60 | Temp 98.1°F | Ht 72.0 in | Wt 169.0 lb

## 2013-11-24 DIAGNOSIS — B20 Human immunodeficiency virus [HIV] disease: Secondary | ICD-10-CM

## 2013-11-24 NOTE — Progress Notes (Signed)
Patient ID: Craig Morales, male   DOB: [birth year], 57 y.o.   MRN: [id number]          [facility] for Infectious Disease  Patient Active Problem List   Diagnosis Date Noted  . HIV DISEASE 02/12/2007    Priority: High  . History of calcium pyrophosphate deposition disease (CPPD) 07/25/2013    Priority: Medium  . Visit for preventive health examination 10/07/2013  . Gout 10/07/2013  . Degenerative arthritis of spine 11/27/2011  . EPISTAXIS, RECURRENT 11/26/2010  . IRRITABLE BOWEL SYNDROME 05/23/2008  . HERPES LABIALIS 02/23/2007  . GERD 02/23/2007  . HERPES ZOSTER, UNCOMPLICATED 02/12/2007  . DEVIATED NASAL SEPTUM 02/12/2007  . ALLERGIC RHINITIS 02/12/2007  . PSORIASIS 02/12/2007  . DIARRHEA 02/12/2007  . KNEE INJURY 02/12/2007    Patient's Medications  New Prescriptions   No medications on file  Previous Medications   ASCORBIC ACID (VITAMIN C WITH ROSE HIPS) 500 MG TABLET    Take 500 mg by mouth daily.   AZELASTINE HCL 0.15 % SOLN    Place 2 sprays into the nose daily.   COLCHICINE 0.6 MG TABLET    Take 0.6 mg by mouth daily.   DEXLANSOPRAZOLE (DEXILANT) 60 MG CAPSULE    Take 60 mg by mouth daily.   DEXTROMETHORPHAN-GUAIFENESIN (MUCINEX DM) 30-600 MG PER 12 HR TABLET    Take 1 tablet by mouth daily as needed.   EFAVIRENZ-EMTRICITABINE-TENOFOVIR (ATRIPLA) 600-200-300 MG PER TABLET    Take 1 tablet by mouth daily.   FLUTICASONE (FLONASE) 50 MCG/ACT NASAL SPRAY    Place 2 sprays into the nose daily.   KRILL OIL OMEGA-3 PO    Take 1 each by mouth daily.   MELATONIN 5 MG CAPS    Take 1 each by mouth at bedtime.   MISC NATURAL PRODUCTS (OSTEO BI-FLEX ADV JOINT SHIELD) TABS    Take 1 each by mouth daily.   MULTIPLE VITAMINS-MINERALS (CENTRUM SILVER ADULT 50+ PO)    Take 1 each by mouth daily.   NAPROXEN SODIUM (ANAPROX) 220 MG TABLET    Take 220 mg by mouth 2 (two) times daily with a meal.   PREDNISONE (DELTASONE) 10 MG TABLET    Take 5 mg by mouth every other day.    PREGABALIN (LYRICA) 75 MG CAPSULE    Take 75 mg by mouth daily.    VALACYCLOVIR (VALTREX) 500 MG TABLET    Take by mouth as needed.    VITAMIN E 400 UNIT CAPSULE    Take 400 Units by mouth daily.  Modified Medications   No medications on file  Discontinued Medications   No medications on file    Subjective: Craig Morales was in for his routine followup visit. Several months ago he developed left wrist pain and was diagnosed with possible pseudogout. He is still having some pain and stiffness and is on a tapering course of prednisone. He was on colchicine for a while but that has been stopped. Apparently other diagnoses are being entertained as well.  Fortunately this has not caused any difficulty taking his Atripla. As usual he never misses a single dose.  Review of Systems: Pertinent items are noted in HPI.  Past Medical History  Diagnosis Date  . Seasonal allergies   . HIV (human immunodeficiency virus infection)     History  Substance Use Topics  . Smoking status: Never Smoker   . Smokeless tobacco: Never Used  . Alcohol Use: 3.5 oz/week    7 drink(s) per week  No family history on file.  Allergies  Allergen Reactions  . Oxycontin [Oxycodone Hcl] Shortness Of Breath and Palpitations  . Hydrocodone     REACTION: SOB and chest pain    Objective: Temp: 98.1 F (36.7 C) (12/18 0913) BP: 112/71 mmHg (12/18 0913) Pulse Rate: 60 (12/18 0913)  General: He is in very good spirits as usual Oral: No oropharyngeal lesions Skin: No rash Lungs: Clear Cor: Regular S1-S2 no murmurs Joints and extremities: Some swelling of both wrists with tenderness on flexion of left wrist. No redness or warmth  Lab Results Lab Results  Component Value Date   WBC 6.9 11/10/2013   HGB 14.9 11/10/2013   HCT 42.5 11/10/2013   MCV 90.0 11/10/2013   PLT 248 11/10/2013    Lab Results  Component Value Date   CREATININE 0.94 11/10/2013   BUN 11 11/10/2013   NA 140 11/10/2013   K 4.4 11/10/2013   CL  100 11/10/2013   CO2 29 11/10/2013    Lab Results  Component Value Date   ALT 20 11/10/2013   AST 24 11/10/2013   ALKPHOS 72 11/10/2013   BILITOT 0.3 11/10/2013    Lab Results  Component Value Date   CHOL 198 10/07/2013   HDL 57 10/07/2013   LDLCALC 100* 10/07/2013   TRIG 207* 10/07/2013   CHOLHDL 3.5 10/07/2013    Lab Results HIV 1 RNA Quant (copies/mL)  Date Value  11/10/2013 <20   04/26/2013 <20   10/20/2012 <20      CD4 T Cell Abs (/uL)  Date Value  11/10/2013 500   04/26/2013 670   10/20/2012 770      Assessment: His HIV infection remains under excellent control.  Plan: 1. Continue Atripla 2. Followup after lab work in 6 months   Cliffton Asters, MD [facility] for Infectious Disease [facility] [phone] pager   [phone] cell 11/24/2013, 10:11 AM

## 2013-12-18 ENCOUNTER — Other Ambulatory Visit: Payer: Self-pay | Admitting: Internal Medicine

## 2013-12-19 NOTE — Telephone Encounter (Signed)
Rx request to pharmacy/SLS  

## 2013-12-21 ENCOUNTER — Telehealth: Payer: Self-pay | Admitting: Physician Assistant

## 2013-12-21 DIAGNOSIS — R6889 Other general symptoms and signs: Secondary | ICD-10-CM

## 2013-12-21 MED ORDER — OSELTAMIVIR PHOSPHATE 75 MG PO CAPS: 75.0000 mg | ORAL_CAPSULE | Freq: Two times a day (BID) | ORAL | Status: DC

## 2013-12-21 NOTE — Telephone Encounter (Signed)
Spoke with patient personally.  Sudden onset of flu-like symptoms early this am.  Patient with known exposure to influenza -- coworker.  Patient with immunocompromised state (HIV+).  Rx Tamiflu sent to pharmacy.  Patient given instruction on supportive measures.  Instructed to proceed to the ER if unable to tolerate PO fluids or if temperature reaches 102 and does not decrease with Tylenol.

## 2013-12-21 NOTE — Telephone Encounter (Signed)
[facility] [place] patient has a fever of 102.  Nausea vomiting  Body aches.  Please call in something for the nausea

## 2014-01-27 ENCOUNTER — Other Ambulatory Visit: Payer: Self-pay | Admitting: Internal Medicine

## 2014-04-05 ENCOUNTER — Other Ambulatory Visit: Payer: Self-pay | Admitting: Internal Medicine

## 2014-04-05 MED ORDER — VALACYCLOVIR HCL 500 MG PO TABS: 500.0000 mg | ORAL_TABLET | Freq: Two times a day (BID) | ORAL | Status: DC

## 2014-04-11 ENCOUNTER — Other Ambulatory Visit: Payer: Self-pay | Admitting: Licensed Clinical Social Worker

## 2014-04-11 MED ORDER — VALACYCLOVIR HCL 500 MG PO TABS: 500.0000 mg | ORAL_TABLET | Freq: Two times a day (BID) | ORAL | Status: DC

## 2014-04-18 ENCOUNTER — Other Ambulatory Visit: Payer: Self-pay | Admitting: *Deleted

## 2014-04-18 DIAGNOSIS — B20 Human immunodeficiency virus [HIV] disease: Secondary | ICD-10-CM

## 2014-04-18 MED ORDER — EFAVIRENZ-EMTRICITAB-TENOFOVIR 600-200-300 MG PO TABS: ORAL_TABLET | ORAL | Status: DC

## 2014-05-11 ENCOUNTER — Other Ambulatory Visit: Payer: 59

## 2014-05-11 DIAGNOSIS — B20 Human immunodeficiency virus [HIV] disease: Secondary | ICD-10-CM

## 2014-05-11 DIAGNOSIS — Z79899 Other long term (current) drug therapy: Secondary | ICD-10-CM

## 2014-05-11 DIAGNOSIS — Z113 Encounter for screening for infections with a predominantly sexual mode of transmission: Secondary | ICD-10-CM

## 2014-05-11 LAB — CBC
HEMATOCRIT: 42.3 % (ref 39.0–52.0)
Hemoglobin: 14.6 g/dL (ref 13.0–17.0)
MCH: 30.4 pg (ref 26.0–34.0)
MCHC: 34.5 g/dL (ref 30.0–36.0)
MCV: 88.1 fL (ref 78.0–100.0)
Platelets: 246 10*3/uL (ref 150–400)
RBC: 4.8 MIL/uL (ref 4.22–5.81)
RDW: 13.8 % (ref 11.5–15.5)
WBC: 6.1 10*3/uL (ref 4.0–10.5)

## 2014-05-11 LAB — COMPREHENSIVE METABOLIC PANEL
ALT: 18 U/L (ref 0–53)
AST: 24 U/L (ref 0–37)
Albumin: 4.2 g/dL (ref 3.5–5.2)
Alkaline Phosphatase: 77 U/L (ref 39–117)
BUN: 16 mg/dL (ref 6–23)
CO2: 26 mEq/L (ref 19–32)
CREATININE: 0.92 mg/dL (ref 0.50–1.35)
Calcium: 9.5 mg/dL (ref 8.4–10.5)
Chloride: 103 mEq/L (ref 96–112)
Glucose, Bld: 81 mg/dL (ref 70–99)
Potassium: 4.3 mEq/L (ref 3.5–5.3)
Sodium: 138 mEq/L (ref 135–145)
Total Bilirubin: 0.3 mg/dL (ref 0.2–1.2)
Total Protein: 7.2 g/dL (ref 6.0–8.3)

## 2014-05-11 LAB — LIPID PANEL
CHOL/HDL RATIO: 4.2 ratio
CHOLESTEROL: 183 mg/dL (ref 0–200)
HDL: 44 mg/dL (ref >39)
LDL Cholesterol: 104 mg/dL — ABNORMAL HIGH (ref 0–99)
Triglycerides: 175 mg/dL — ABNORMAL HIGH (ref <150)
VLDL: 35 mg/dL (ref 0–40)

## 2014-05-12 LAB — T-HELPER CELL (CD4) - (RCID CLINIC ONLY)
CD4 % Helper T Cell: 34 % (ref 33–55)
CD4 T Cell Abs: 650 /uL (ref 400–2700)

## 2014-05-12 LAB — HIV-1 RNA QUANT-NO REFLEX-BLD
HIV 1 RNA Quant: <20 copies/mL (ref <20)
HIV-1 RNA Quant, Log: <1.3 {Log} (ref <1.30)

## 2014-05-12 LAB — RPR

## 2014-05-25 ENCOUNTER — Encounter: Payer: Self-pay | Admitting: Internal Medicine

## 2014-05-25 ENCOUNTER — Ambulatory Visit (INDEPENDENT_AMBULATORY_CARE_PROVIDER_SITE_OTHER): Payer: 59 | Admitting: Internal Medicine

## 2014-05-25 VITALS — BP 110/71 | HR 60 | Temp 98.1°F | Wt 169.5 lb

## 2014-05-25 DIAGNOSIS — B20 Human immunodeficiency virus [HIV] disease: Secondary | ICD-10-CM

## 2014-05-25 NOTE — Progress Notes (Signed)
Patient ID: Craig Morales, male   DOB: 10/10/1956, 58 y.o.   MRN: [id number]          [facility] for Infectious Disease  Patient Active Problem List   Diagnosis Date Noted  . HIV DISEASE 02/12/2007    Priority: High  . History of calcium pyrophosphate deposition disease (CPPD) 07/25/2013    Priority: Medium  . Visit for preventive health examination 10/07/2013  . Gout 10/07/2013  . Degenerative arthritis of spine 11/27/2011  . EPISTAXIS, RECURRENT 11/26/2010  . IRRITABLE BOWEL SYNDROME 05/23/2008  . HERPES LABIALIS 02/23/2007  . GERD 02/23/2007  . HERPES ZOSTER, UNCOMPLICATED 02/12/2007  . DEVIATED NASAL SEPTUM 02/12/2007  . ALLERGIC RHINITIS 02/12/2007  . PSORIASIS 02/12/2007  . DIARRHEA 02/12/2007  . KNEE INJURY 02/12/2007    Patient's Medications  New Prescriptions   No medications on file  Previous Medications   ASCORBIC ACID (VITAMIN C WITH ROSE HIPS) 500 MG TABLET    Take 500 mg by mouth daily.   AZELASTINE HCL 0.15 % SOLN    Place 2 sprays into the nose daily.   COLCHICINE 0.6 MG TABLET    Take 0.6 mg by mouth daily.   DEXLANSOPRAZOLE (DEXILANT) 60 MG CAPSULE    Take 60 mg by mouth daily.   DEXTROMETHORPHAN-GUAIFENESIN (MUCINEX DM) 30-600 MG PER 12 HR TABLET    Take 1 tablet by mouth daily as needed.   EFAVIRENZ-EMTRICITABINE-TENOFOVIR (ATRIPLA) 600-200-300 MG PER TABLET    Take 1 tablet by mouth  daily   FLUTICASONE (FLONASE) 50 MCG/ACT NASAL SPRAY    Use 2 sprays nasally daily   KRILL OIL OMEGA-3 PO    Take 1 each by mouth daily.   MELATONIN 5 MG CAPS    Take 1 each by mouth at bedtime.   MISC NATURAL PRODUCTS (OSTEO BI-FLEX ADV JOINT SHIELD) TABS    Take 1 each by mouth daily.   MULTIPLE VITAMINS-MINERALS (CENTRUM SILVER ADULT 50+ PO)    Take 1 each by mouth daily.   NAPROXEN SODIUM (ANAPROX) 220 MG TABLET    Take 220 mg by mouth 2 (two) times daily with a meal.   OSELTAMIVIR (TAMIFLU) 75 MG CAPSULE    Take 1 capsule (75 mg total) by mouth 2 (two) times  daily.   PREDNISONE (DELTASONE) 10 MG TABLET    Take 5 mg by mouth every other day.    PREGABALIN (LYRICA) 75 MG CAPSULE    Take 75 mg by mouth daily.    VALACYCLOVIR (VALTREX) 500 MG TABLET    Take 1 tablet (500 mg total) by mouth 2 (two) times daily. Take 1 tablet (500 mg total) by mouth 2 (two) times daily as needed.   VITAMIN E 400 UNIT CAPSULE    Take 400 Units by mouth daily.  Modified Medications   No medications on file  Discontinued Medications   No medications on file    Subjective: Rasean is in for his routine visit. He has had no problems obtaining or tolerating Atripla. As usual he never misses a single dose.  He recently started on a sublingual allergy serum. He has not noticed any improvement in his allergy symptoms 4.  Review of Systems: Pertinent items are noted in HPI.  Past Medical History  Diagnosis Date  . Seasonal allergies   . HIV (human immunodeficiency virus infection)     History  Substance Use Topics  . Smoking status: Never Smoker   . Smokeless tobacco: Never Used  . Alcohol Use:  3.5 oz/week    7 drink(s) per week    No family history on file.  Allergies  Allergen Reactions  . Oxycontin [Oxycodone Hcl] Shortness Of Breath and Palpitations  . Hydrocodone     REACTION: SOB and chest pain    Objective: Temp: 98.1 F (36.7 C) (06/18 1348) Temp src: Oral (06/18 1348) BP: 110/71 mmHg (06/18 1348) Pulse Rate: 60 (06/18 1348)  General: He is in very good spirits as usual Oral: No oropharyngeal lesions Skin: No rash Lungs: Clear Cor: Regular S1-S2 no murmurs   Lab Results Lab Results  Component Value Date   WBC 6.1 05/11/2014   HGB 14.6 05/11/2014   HCT 42.3 05/11/2014   MCV 88.1 05/11/2014   PLT 246 05/11/2014    Lab Results  Component Value Date   CREATININE 0.92 05/11/2014   BUN 16 05/11/2014   NA 138 05/11/2014   K 4.3 05/11/2014   CL 103 05/11/2014   CO2 26 05/11/2014    Lab Results  Component Value Date   ALT 18 05/11/2014   AST 24 05/11/2014     ALKPHOS 77 05/11/2014   BILITOT 0.3 05/11/2014    Lab Results  Component Value Date   CHOL 183 05/11/2014   HDL 44 05/11/2014   LDLCALC 943* 05/11/2014   TRIG 175* 05/11/2014   CHOLHDL 4.2 05/11/2014    Lab Results HIV 1 RNA Quant (copies/mL)  Date Value  05/11/2014 <20   11/10/2013 <20   04/26/2013 <20      CD4 T Cell Abs (/uL)  Date Value  05/11/2014 650   11/10/2013 500   04/26/2013 670      Assessment: His HIV infection remains under excellent control.  Plan: 1. Continue Atripla 2. Followup after lab work in 6 months   Cliffton Asters, MD [facility] for Infectious Disease [facility] [phone] pager   [phone] cell 05/25/2014, 2:12 PM

## 2014-06-02 ENCOUNTER — Encounter: Payer: Self-pay | Admitting: Internal Medicine

## 2014-06-02 NOTE — Telephone Encounter (Addendum)
Error Phone call to pt.  Made appt for Tuesday, June 30 @ 4 PM for Shingles vaccine.

## 2014-06-06 ENCOUNTER — Ambulatory Visit (INDEPENDENT_AMBULATORY_CARE_PROVIDER_SITE_OTHER): Payer: 59 | Admitting: *Deleted

## 2014-06-06 DIAGNOSIS — Z23 Encounter for immunization: Secondary | ICD-10-CM

## 2014-06-06 DIAGNOSIS — Z2911 Encounter for prophylactic immunotherapy for respiratory syncytial virus (RSV): Secondary | ICD-10-CM

## 2014-07-03 ENCOUNTER — Telehealth: Payer: Self-pay | Admitting: *Deleted

## 2014-07-03 NOTE — Telephone Encounter (Signed)
Patient was advised to receive a zoster vaccination prior to starting methotrexate for arthritis treatment.  Patient came to [facility] for the vaccination per Dr. Orvan Falconer, received the injection 6/30.  The claim was rejected by the patient's insurance company.  Pt asked this RN to see if there was any more information or different coding that the claim would need in order to be paid.   RN contacted the insurance company.  Claim was rejected because the patient was below the minimum age standard for this immunization.  If it was deemed medically advisable to receive the immunization, the insurance company needs documentation stating this and why sent to their claims processing center.   Documenation should be mailed to  Claims  #[id number] [facility] [place], [place] [address]. RN left patient a message asking him to call back for permission to follow through with this. Andree Coss, RN

## 2014-07-19 NOTE — Telephone Encounter (Signed)
This is not a [facility] Study Patient per K. Jackolyn Confer, RN

## 2014-07-19 NOTE — Telephone Encounter (Signed)
Is this a Methotrexate study patient?

## 2014-07-25 ENCOUNTER — Encounter: Payer: Self-pay | Admitting: *Deleted

## 2014-07-25 NOTE — Telephone Encounter (Signed)
Supporting documentation mailed for claim resubmission. Andree Coss, RN

## 2014-08-07 ENCOUNTER — Other Ambulatory Visit: Payer: Self-pay | Admitting: Physician Assistant

## 2014-09-12 NOTE — Telephone Encounter (Signed)
Per pt, insurance will cover this vaccine.  He received a letter notifying him of this decision at the end of September. Andree Coss, RN

## 2014-09-22 ENCOUNTER — Other Ambulatory Visit: Payer: Self-pay

## 2014-10-16 ENCOUNTER — Other Ambulatory Visit: Payer: Self-pay | Admitting: *Deleted

## 2014-10-16 DIAGNOSIS — B20 Human immunodeficiency virus [HIV] disease: Secondary | ICD-10-CM

## 2014-10-16 MED ORDER — EFAVIRENZ-EMTRICITAB-TENOFOVIR 600-200-300 MG PO TABS: ORAL_TABLET | ORAL | Status: DC

## 2014-11-14 ENCOUNTER — Encounter: Payer: Self-pay | Admitting: Internal Medicine

## 2014-11-14 ENCOUNTER — Other Ambulatory Visit: Payer: 59

## 2014-11-22 ENCOUNTER — Other Ambulatory Visit: Payer: 59

## 2014-11-22 DIAGNOSIS — B20 Human immunodeficiency virus [HIV] disease: Secondary | ICD-10-CM

## 2014-11-23 LAB — T-HELPER CELL (CD4) - (RCID CLINIC ONLY)
CD4 T CELL HELPER: 38 % (ref 33–55)
CD4 T Cell Abs: 610 /uL (ref 400–2700)

## 2014-11-23 LAB — HIV-1 RNA QUANT-NO REFLEX-BLD: HIV-1 RNA Quant, Log: <1.3 {Log} (ref <1.30)

## 2014-11-28 ENCOUNTER — Encounter: Payer: Self-pay | Admitting: Internal Medicine

## 2014-11-28 ENCOUNTER — Ambulatory Visit (INDEPENDENT_AMBULATORY_CARE_PROVIDER_SITE_OTHER): Payer: 59 | Admitting: Internal Medicine

## 2014-11-28 DIAGNOSIS — B20 Human immunodeficiency virus [HIV] disease: Secondary | ICD-10-CM

## 2014-11-28 NOTE — Progress Notes (Signed)
Patient ID: Craig Morales, male   DOB: 1956/07/17, 58 y.o.   MRN: [id number]          Patient Active Problem List   Diagnosis Date Noted  . Human immunodeficiency virus (HIV) disease 02/12/2007    Priority: High  . History of calcium pyrophosphate deposition disease (CPPD) 07/25/2013    Priority: Medium  . Visit for preventive health examination 10/07/2013  . Gout 10/07/2013  . Degenerative arthritis of spine 11/27/2011  . EPISTAXIS, RECURRENT 11/26/2010  . IRRITABLE BOWEL SYNDROME 05/23/2008  . HERPES LABIALIS 02/23/2007  . GERD 02/23/2007  . HERPES ZOSTER, UNCOMPLICATED 02/12/2007  . DEVIATED NASAL SEPTUM 02/12/2007  . ALLERGIC RHINITIS 02/12/2007  . PSORIASIS 02/12/2007  . DIARRHEA 02/12/2007  . KNEE INJURY 02/12/2007    Patient's Medications  New Prescriptions   No medications on file  Previous Medications   ASCORBIC ACID (VITAMIN C WITH ROSE HIPS) 500 MG TABLET    Take 500 mg by mouth daily.   AZELASTINE HCL 0.15 % SOLN    Place 2 sprays into the nose daily.   DEXLANSOPRAZOLE (DEXILANT) 60 MG CAPSULE    Take 60 mg by mouth daily.   DEXTROMETHORPHAN-GUAIFENESIN (MUCINEX DM) 30-600 MG PER 12 HR TABLET    Take 1 tablet by mouth daily as needed.   EFAVIRENZ-EMTRICITABINE-TENOFOVIR (ATRIPLA) 600-200-300 MG PER TABLET    Take 1 tablet by mouth  daily   FLUTICASONE (FLONASE) 50 MCG/ACT NASAL SPRAY    Use 2 sprays nasally daily   FOLIC ACID (FOLVITE) 1 MG TABLET    Take 1 mg by mouth daily.   KRILL OIL OMEGA-3 PO    Take 1 each by mouth daily.   MELATONIN 5 MG CAPS    Take 1 each by mouth at bedtime.   METHOTREXATE 2.5 MG TABLET    Take 12.5 mg by mouth. Every Sunday and every Monday   MISC NATURAL PRODUCTS (OSTEO BI-FLEX ADV JOINT SHIELD) TABS    Take 1 each by mouth daily.   MULTIPLE VITAMINS-MINERALS (CENTRUM SILVER ADULT 50+ PO)    Take 1 each by mouth daily.   PREDNISONE (DELTASONE) 10 MG TABLET    Take 5 mg by mouth every other day.    PREGABALIN (LYRICA) 75 MG CAPSULE     Take 75 mg by mouth daily.    VALACYCLOVIR (VALTREX) 500 MG TABLET    Take 1 tablet (500 mg total) by mouth 2 (two) times daily. Take 1 tablet (500 mg total) by mouth 2 (two) times daily as needed.   VITAMIN E 400 UNIT CAPSULE    Take 400 Units by mouth daily.  Modified Medications   No medications on file  Discontinued Medications   COLCHICINE 0.6 MG TABLET    Take 0.6 mg by mouth daily.   NAPROXEN SODIUM (ANAPROX) 220 MG TABLET    Take 220 mg by mouth 2 (two) times daily with a meal.   OSELTAMIVIR (TAMIFLU) 75 MG CAPSULE    Take 1 capsule (75 mg total) by mouth 2 (two) times daily.    Subjective: Craig Morales is in for his routine visit. As usual he never misses a single dose of his Atripla. His inflammatory arthritis, affecting his wrists primarily, is much better since she started on methotrexate and prednisone.  Review of Systems: Pertinent items are noted in HPI.  Past Medical History  Diagnosis Date  . Seasonal allergies   . HIV (human immunodeficiency virus infection)     History  Substance Use  Topics  . Smoking status: Never Smoker   . Smokeless tobacco: Never Used  . Alcohol Use: 4.2 oz/week    7 Not specified per week     Comment: occasional    No family history on file.  Allergies  Allergen Reactions  . Oxycontin [Oxycodone Hcl] Shortness Of Breath and Palpitations  . Hydrocodone     REACTION: SOB and chest pain    Objective: Temp: 98 F (36.7 C) (12/22 1123) Temp Source: Oral (12/22 1123) BP: 116/80 mmHg (12/22 1123) Pulse Rate: 64 (12/22 1123) Body mass index is 22.51 kg/(m^2).  General: He is in good spirits as usual Oral: No oropharyngeal lesions Skin: No rash Lungs: Clear Cor: Regular S1 and S2 with no murmurs   Lab Results Lab Results  Component Value Date   WBC 6.1 05/11/2014   HGB 14.6 05/11/2014   HCT 42.3 05/11/2014   MCV 88.1 05/11/2014   PLT 246 05/11/2014    Lab Results  Component Value Date   CREATININE 0.92 05/11/2014   BUN 16  05/11/2014   NA 138 05/11/2014   K 4.3 05/11/2014   CL 103 05/11/2014   CO2 26 05/11/2014    Lab Results  Component Value Date   ALT 18 05/11/2014   AST 24 05/11/2014   ALKPHOS 77 05/11/2014   BILITOT 0.3 05/11/2014    Lab Results  Component Value Date   CHOL 183 05/11/2014   HDL 44 05/11/2014   LDLCALC 104* 05/11/2014   TRIG 175* 05/11/2014   CHOLHDL 4.2 05/11/2014    Lab Results HIV 1 RNA QUANT (copies/mL)  Date Value  11/22/2014 <20  05/11/2014 <20  11/10/2013 <20   CD4 T CELL ABS (/uL)  Date Value  11/22/2014 610  05/11/2014 650  11/10/2013 500     Assessment: His HIV infection remains under excellent control.  Plan: 1. Continue Atripla 2. Follow-up after lab work in 6 months   Cliffton Asters, MD [facility] for Infectious Disease [facility] [phone] pager   [phone] cell 11/28/2014, 11:56 AM

## 2014-12-25 ENCOUNTER — Other Ambulatory Visit: Payer: Self-pay | Admitting: *Deleted

## 2014-12-25 DIAGNOSIS — B20 Human immunodeficiency virus [HIV] disease: Secondary | ICD-10-CM

## 2014-12-25 MED ORDER — EFAVIRENZ-EMTRICITAB-TENOFOVIR 600-200-300 MG PO TABS
ORAL_TABLET | ORAL | Status: DC
Start: 2014-12-25 — End: 2015-07-13

## 2015-01-04 ENCOUNTER — Ambulatory Visit: Payer: BLUE CROSS/BLUE SHIELD | Admitting: Rehabilitation

## 2015-01-10 ENCOUNTER — Ambulatory Visit: Payer: BLUE CROSS/BLUE SHIELD | Admitting: Physical Therapy

## 2015-01-11 ENCOUNTER — Ambulatory Visit (INDEPENDENT_AMBULATORY_CARE_PROVIDER_SITE_OTHER): Payer: BLUE CROSS/BLUE SHIELD

## 2015-01-11 DIAGNOSIS — S139XXA Sprain of joints and ligaments of unspecified parts of neck, initial encounter: Secondary | ICD-10-CM

## 2015-01-11 DIAGNOSIS — M6281 Muscle weakness (generalized): Secondary | ICD-10-CM | POA: Diagnosis not present

## 2015-01-11 DIAGNOSIS — M542 Cervicalgia: Secondary | ICD-10-CM

## 2015-01-15 ENCOUNTER — Encounter: Payer: BLUE CROSS/BLUE SHIELD | Admitting: Physical Therapy

## 2015-01-16 ENCOUNTER — Encounter (INDEPENDENT_AMBULATORY_CARE_PROVIDER_SITE_OTHER): Payer: BLUE CROSS/BLUE SHIELD | Admitting: Physical Therapy

## 2015-01-16 DIAGNOSIS — S139XXA Sprain of joints and ligaments of unspecified parts of neck, initial encounter: Secondary | ICD-10-CM | POA: Diagnosis not present

## 2015-01-16 DIAGNOSIS — M542 Cervicalgia: Secondary | ICD-10-CM | POA: Diagnosis not present

## 2015-01-16 DIAGNOSIS — M6281 Muscle weakness (generalized): Secondary | ICD-10-CM | POA: Diagnosis not present

## 2015-01-18 ENCOUNTER — Encounter (INDEPENDENT_AMBULATORY_CARE_PROVIDER_SITE_OTHER): Payer: BLUE CROSS/BLUE SHIELD | Admitting: Physical Therapy

## 2015-01-18 DIAGNOSIS — M6281 Muscle weakness (generalized): Secondary | ICD-10-CM | POA: Diagnosis not present

## 2015-01-18 DIAGNOSIS — M542 Cervicalgia: Secondary | ICD-10-CM | POA: Diagnosis not present

## 2015-01-18 DIAGNOSIS — S139XXA Sprain of joints and ligaments of unspecified parts of neck, initial encounter: Secondary | ICD-10-CM | POA: Diagnosis not present

## 2015-01-19 ENCOUNTER — Encounter: Payer: BLUE CROSS/BLUE SHIELD | Admitting: Physical Therapy

## 2015-01-22 ENCOUNTER — Encounter: Payer: BLUE CROSS/BLUE SHIELD | Admitting: Physical Therapy

## 2015-01-26 ENCOUNTER — Ambulatory Visit (INDEPENDENT_AMBULATORY_CARE_PROVIDER_SITE_OTHER): Payer: BLUE CROSS/BLUE SHIELD | Admitting: Physical Therapy

## 2015-01-26 DIAGNOSIS — M542 Cervicalgia: Secondary | ICD-10-CM | POA: Diagnosis not present

## 2015-01-26 DIAGNOSIS — M6281 Muscle weakness (generalized): Secondary | ICD-10-CM

## 2015-01-26 NOTE — Patient Instructions (Signed)
Rotation: Resisted - Beginning to Mid Range (Supine)   With head turned to left, applying gentle resistance at temple or cheek on other side, rotate to center. Repeat _8___ times per set. Do _1___ sets per session. Do _1___ sessions per day.  http://orth.exer.us/356   Copyright  VHI. All rights reserved.   See also paper handout of shoulder exercise (scapular retraction - elbows in, elbows up and D1 flexion with green band)

## 2015-01-26 NOTE — Therapy (Signed)
[place], [place], [address] Phone: [phone]   Fax:  [phone]  Physical Therapy Treatment  Patient Details  Name: Craig Morales MRN: [id number] Date of Birth: 11/12/1956 Referring Provider:  Berle Mull, MD  Encounter Date: 01/26/2015      PT End of Session - 01/26/15 1155    Visit Number 4   Number of Visits 8   Date for PT Re-Evaluation 02/08/15   PT Start Time 3536   PT Stop Time 1240   PT Time Calculation (min) 46 min   Activity Tolerance Patient tolerated treatment well      Past Medical History  Diagnosis Date  . Seasonal allergies   . HIV (human immunodeficiency virus infection)     Past Surgical History  Procedure Laterality Date  . Knee surgery    . Nasal septum surgery      There were no vitals taken for this visit.  Visit Diagnosis:  Pain in neck  Generalized muscle weakness      Subjective Assessment - 01/26/15 1157    Symptoms Over past 3 nights, pain gets up to 3/10 with headache at end of day.  Tingling in R hand last night and a little this morning; relieved with neck stretches.    Limitations Reading  causes headache.   Currently in Pain? No/denies  stiffness          [facility] PT Assessment - 01/26/15 0001    AROM   Cervical Flexion 35 degrees    Cervical Extension 31 degrees    Cervical - Right Side Bend 30 degrees    Cervical - Left Side Bend 35 degrees    Cervical - Right Rotation 38 degrees    Cervical - Left Rotation 42 degrees           [facility] Adult PT Treatment/Exercise - 01/26/15 0001    Exercises   Exercises Neck;Shoulder   Neck Exercises: Machines for Strengthening   UBE (Upper Arm Bike) Level 2, 2 min each direction    Neck Exercises: Theraband   Scapula Retraction 10 reps;Green  elbows at side x 10, elbows up x 10    Neck Exercises: Supine   Cervical Isometrics Right rotation;Left rotation;5 secs;10 reps;Other (comment)  and  head pressess 3" x 10    Shoulder ABduction --  on 1/2 foam roll -snowangels   Theraband Level (UE D1) Level 3 (Green)  x 10 reps x 2 sets; on 1/2 foam roll.    Neck Exercises: Prone   Other Prone Exercise POE   cervical flex to neutral x 8, cervical diagonals x 8 each   Shoulder Exercises: Supine   Other Supine Exercises --           PT Education - 01/26/15 1308    Education provided Yes   Education Details HEP, Self care- educated pt on posture sitting at desk or on a therapy ball, to decrease stress on cervical muscles.    Person(s) Educated Patient   Methods Explanation;Demonstration   Comprehension Returned demonstration;Verbalized understanding          PT Short Term Goals - 01/26/15 1252    PT SHORT TERM GOAL #1   Title Be independent with intial HEP    Time 2   Period Weeks   Status Achieved   PT SHORT TERM GOAL #2   Title Report pain decrease    Time 2   Period Weeks   Status Achieved  PT SHORT TERM GOAL #3   Title ROM to be WNL for safe driving    Time 2   Period Weeks   Status On-going           PT Long Term Goals - 01/26/15 1300    PT LONG TERM GOAL #1   Title Demonstrate and/or verbalize techniques to reduce risk of re-injury to include information on posture and body mechanics.    Time 4   Period Weeks   Status Achieved   PT LONG TERM GOAL #2   Title Be independent with advanced HEP    Time 4   Period Weeks   Status On-going   PT LONG TERM GOAL #3   Title Report pain decrease no greater than a 1/10 80% of the day to perform work activity   Time 4   Period Weeks   Status On-going   PT LONG TERM GOAL #4   Title Tolerate sitting for work duties   Time 4   Period Weeks   Status On-going   PT LONG TERM GOAL #5   Title Improve FOTO =/<26% limited    Time 4   Period Weeks   Status On-going   Additional Long Term Goals   Additional Long Term Goals Yes   PT LONG TERM GOAL #6   Title Able to sleep a full 8 hours    Time 4   Period  Weeks   Status On-going            Plan - 01/26/15 1245    Clinical Impression Statement Pt tolerated new exercises without increase in symptoms or pain. Pt remains limited with cervical rotation and cervical lateral bending.  Pt has met LTG #1 today, progressing well towards all other goals.    PT Frequency 2x / week   PT Duration 4 weeks   PT Treatment/Interventions Cryotherapy;Electrical Stimulation;Moist Heat;Ultrasound;Therapeutic exercise;Patient/family education;Manual techniques   PT Next Visit Plan Continue progressive strengthening/ ROM for cervical spine/ scapula.    Consulted and Agree with Plan of Care Patient        Problem List Patient Active Problem List   Diagnosis Date Noted  . Visit for preventive health examination 10/07/2013  . Gout 10/07/2013  . History of calcium pyrophosphate deposition disease (CPPD) 07/25/2013  . Degenerative arthritis of spine 11/27/2011  . EPISTAXIS, RECURRENT 11/26/2010  . IRRITABLE BOWEL SYNDROME 05/23/2008  . HERPES LABIALIS 02/23/2007  . GERD 02/23/2007  . Human immunodeficiency virus (HIV) disease 02/12/2007  . HERPES ZOSTER, UNCOMPLICATED 41/28/2081  . DEVIATED NASAL SEPTUM 02/12/2007  . ALLERGIC RHINITIS 02/12/2007  . PSORIASIS 02/12/2007  . DIARRHEA 02/12/2007  . KNEE INJURY 02/12/2007   Kerin Perna, PTA 01/26/2015 1:11 PM   [facility] [place], [place], [address] Phone: [phone]   Fax:  [phone]

## 2015-01-29 ENCOUNTER — Ambulatory Visit (INDEPENDENT_AMBULATORY_CARE_PROVIDER_SITE_OTHER): Payer: BLUE CROSS/BLUE SHIELD | Admitting: Physical Therapy

## 2015-01-29 DIAGNOSIS — M542 Cervicalgia: Secondary | ICD-10-CM | POA: Diagnosis not present

## 2015-01-29 DIAGNOSIS — M6281 Muscle weakness (generalized): Secondary | ICD-10-CM

## 2015-01-29 NOTE — Therapy (Signed)
[place], [place], [address] Phone: [phone]   Fax:  [phone]  Physical Therapy Treatment  Patient Details  Name: Craig Morales MRN: [id number] Date of Birth: 1956/08/15 Referring Provider:  Berle Mull, MD  Encounter Date: 01/29/2015      PT End of Session - 01/29/15 0806    Visit Number 5   Number of Visits 8   Date for PT Re-Evaluation 02/08/15   PT Start Time 0806   PT Stop Time 0905   PT Time Calculation (min) 59 min      Past Medical History  Diagnosis Date  . Seasonal allergies   . HIV (human immunodeficiency virus infection)     Past Surgical History  Procedure Laterality Date  . Knee surgery    . Nasal septum surgery      There were no vitals taken for this visit.  Visit Diagnosis:  Pain in neck  Generalized muscle weakness      Subjective Assessment - 01/29/15 0807    Symptoms Had to do yard work this weekend.  Little bit of tingling when performing overhead activities. 3-4/10 pain this weekend. Pt reports he feels he can tolerate yard work longer before fatigued in neck. HEP going well.    Currently in Pain? Yes   Pain Score 2    Pain Location Neck   Pain Orientation Right;Left   Pain Descriptors / Indicators Dull   Aggravating Factors  yard work, lifting, carrying his dog too long (20#)   Pain Relieving Factors exercises, hot showers, occasional medicine.           [facility] PT Assessment - 01/29/15 0001    Assessment   Medical Diagnosis cervical strain with DDD   AROM   Cervical Flexion 45   Cervical Extension 36   Cervical - Right Side Bend 42   Cervical - Left Side Bend 35   Cervical - Right Rotation 40   Cervical - Left Rotation 48           [facility] Adult PT Treatment/Exercise - 01/29/15 0001    Exercises   Exercises Neck;Shoulder   Neck Exercises: Machines for Strengthening   UBE (Upper Arm Bike) Level 3, 2 min each direction standing   Neck Exercises: Theraband   Scapula Retraction 10 reps;Green  elbows at side x 10, elbows up x 10 (Standing)   Neck Exercises: Supine   Shoulder Flexion Right;Left;10 reps  Full foam roll. Able to touch fingers to mat on R, not left    Theraband Level (UE D1) Level 3 (Green)  x 10 reps x 2 sets; on full foam roll.    Neck Exercises: Prone   W Back 10 reps  Goal post arms. req multiple VC for form.    Other Prone Exercise POE   cervical flex to neutral x 10, cervical diagonals x 10 each   Shoulder Exercises: Supine   Other Supine Exercises Full foam rolll  Snow angels x 10    Shoulder Exercises: Stretch   Other Shoulder Stretches Pec stretch, UE in various levels 30 sec x 3 each UE   Modalities   Modalities Electrical Stimulation;Moist Heat   Moist Heat Therapy   Number Minutes Moist Heat 15 Minutes   Moist Heat Location Other (comment)  cervical    Electrical Stimulation   Electrical Stimulation Location --  cervical paraspinals and upper trap B   Electrical Stimulation Action IFC    Electrical Stimulation  Parameters 80/150 Hz, x 15 min    Electrical Stimulation Goals Pain   Neck Exercises: Stretches   Upper Trapezius Stretch 2 reps;30 seconds           PT Short Term Goals - 01/26/15 1252    PT SHORT TERM GOAL #1   Title Be independent with intial HEP    Time 2   Period Weeks   Status Achieved   PT SHORT TERM GOAL #2   Title Report pain decrease    Time 2   Period Weeks   Status Achieved   PT SHORT TERM GOAL #3   Title ROM to be WNL for safe driving    Time 2   Period Weeks   Status On-going           PT Long Term Goals - 01/29/15 0831    PT LONG TERM GOAL #1   Title Demonstrate and/or verbalize techniques to reduce risk of re-injury to include information on posture and body mechanics.    Time 4   Status Achieved   PT LONG TERM GOAL #2   Title Be independent with advanced HEP    Time 4   Period Weeks   Status On-going   PT LONG TERM GOAL #3    Title Report pain decrease no greater than a 1/10 80% of the day to perform work activity   Time 4   Period Weeks   Status On-going  reports 2/10 pain 80% of work day   PT LONG TERM GOAL #4   Title Tolerate sitting for work duties   Time 4   Period Weeks   Status Achieved   PT LONG TERM GOAL #5   Title Improve FOTO =/<26% limited    Time 4   Period Weeks   Status On-going   PT LONG TERM GOAL #6   Title Able to sleep a full 8 hours    Time 4   Period Weeks   Status Partially Met  80% of time sleeps 8 hrs without waking due to pain           Plan - 01/29/15 1103    Clinical Impression Statement Pt tolerated new exercises without increase in symptoms or pain. Pt demonstrated improved cervical ROM, progressing towards goals. Pt has partially met LTG #6 and has met LTG #4.   Rehab Potential Good   PT Frequency 2x / week   PT Duration 4 weeks   PT Treatment/Interventions Cryotherapy;Electrical Stimulation;Moist Heat;Ultrasound;Therapeutic exercise;Patient/family education;Manual techniques   PT Next Visit Plan Continue progressive strengthening/ ROM for cervical spine/ scapula.    Consulted and Agree with Plan of Care Patient        Problem List Patient Active Problem List   Diagnosis Date Noted  . Visit for preventive health examination 10/07/2013  . Gout 10/07/2013  . History of calcium pyrophosphate deposition disease (CPPD) 07/25/2013  . Degenerative arthritis of spine 11/27/2011  . EPISTAXIS, RECURRENT 11/26/2010  . IRRITABLE BOWEL SYNDROME 05/23/2008  . HERPES LABIALIS 02/23/2007  . GERD 02/23/2007  . Human immunodeficiency virus (HIV) disease 02/12/2007  . HERPES ZOSTER, UNCOMPLICATED 15/94/5859  . DEVIATED NASAL SEPTUM 02/12/2007  . ALLERGIC RHINITIS 02/12/2007  . PSORIASIS 02/12/2007  . DIARRHEA 02/12/2007  . KNEE INJURY 02/12/2007    Kerin Perna, PTA 01/29/2015 9:25 AM    [place]  [place], [place], [address] Phone: [phone]   Fax:  [phone]

## 2015-02-05 ENCOUNTER — Encounter: Payer: BLUE CROSS/BLUE SHIELD | Admitting: Physical Therapy

## 2015-02-09 ENCOUNTER — Ambulatory Visit (INDEPENDENT_AMBULATORY_CARE_PROVIDER_SITE_OTHER): Payer: BLUE CROSS/BLUE SHIELD | Admitting: Physical Therapy

## 2015-02-09 DIAGNOSIS — M542 Cervicalgia: Secondary | ICD-10-CM

## 2015-02-09 DIAGNOSIS — M6281 Muscle weakness (generalized): Secondary | ICD-10-CM

## 2015-02-09 NOTE — Patient Instructions (Signed)
Self care for pt at home and work. Decrease perching at desk. Sit back in chair to prevent chin lift and compression of CS. Continue with lying with back on physico baall at home to open pects to help with rounded shoulders. Pt will continue with HEP and be more aware of posture at work and home.We discussed sitting, standing, lying posture and bio mechanics with body mechanics extensively today.

## 2015-02-09 NOTE — Therapy (Signed)
[facility] [place] [address] [place], [place], [address] Phone: [phone]   Fax:  [phone]  Physical Therapy Treatment  Patient Details  Name: Craig Morales MRN: [id number] Date of Birth: Feb 06, 1956 Referring Provider:  Pati Gallo, MD  Encounter Date: 02/09/2015      PT End of Session - 02/09/15 1604    PT Start Time 0315   PT Stop Time 0402   PT Time Calculation (min) 47 min      Past Medical History  Diagnosis Date  . Seasonal allergies   . HIV (human immunodeficiency virus infection)     Past Surgical History  Procedure Laterality Date  . Knee surgery    . Nasal septum surgery      There were no vitals taken for this visit.  Visit Diagnosis:  Pain in neck  Generalized muscle weakness      Subjective Assessment - 02/09/15 1517    Symptoms little tingling right arm before he got here, and UBE    Currently in Pain? No/denies  stiffness bilat shoulders and neck          [facility] PT Assessment - 02/09/15 0001    AROM   AROM Assessment Site Cervical   Cervical Flexion 50   Cervical Extension 30   Cervical - Right Side Bend 30   Cervical - Left Side Bend 20   Cervical - Right Rotation 50   Cervical - Left Rotation 45                  [facility] Adult PT Treatment/Exercise - 02/09/15 0001    Neck Exercises: Machines for Strengthening   UBE (Upper Arm Bike) 4 min X level 2   Shoulder Exercises: Supine   Protraction Weight (lbs) 3 bilateral   3X10   Shoulder Exercises: Prone   Retraction Weight (lbs) 0# for Y's bilat EOB    Shoulder Exercises: Stretch   Other Shoulder Stretches standing pect stretch  hold to top of door jam  with line of pects turn body to opposite side for stetch bil   Other Shoulder Stretches clock stretch billt                  PT Short Term Goals - 01/26/15 1252    PT SHORT TERM GOAL #1   Title Be independent with intial HEP    Time 2   Period Weeks   Status Achieved   PT SHORT TERM GOAL #2   Title Report pain decrease    Time 2   Period Weeks   Status Achieved   PT SHORT TERM GOAL #3   Title ROM to be WNL for safe driving    Time 2   Period Weeks   Status On-going           PT Long Term Goals - 02/09/15 1608    PT LONG TERM GOAL #5   Title --  unable to assess system down               Plan - 02/09/15 1600    Clinical Impression Statement pt did well today with education and new ther ex for stretches to relieve tingling. PTA discussed posture perching at desk at work, he will get back into his chair for better posture and no more perching. He would like to  continue with PT to increase ROM in CS, strength in [place] and decrease tingling.    Rehab Potential Good  PT Frequency 2x / week   PT Duration 4 weeks   PT Treatment/Interventions Cryotherapy;Electrical Stimulation;Moist Heat;Ultrasound;Therapeutic exercise;Patient/family education;Manual techniques   PT Next Visit Plan continue with posture and functional ROM and strengthening to assist with work duties.    Consulted and Agree with Plan of Care Patient        Problem List Patient Active Problem List   Diagnosis Date Noted  . Visit for preventive health examination 10/07/2013  . Gout 10/07/2013  . History of calcium pyrophosphate deposition disease (CPPD) 07/25/2013  . Degenerative arthritis of spine 11/27/2011  . EPISTAXIS, RECURRENT 11/26/2010  . IRRITABLE BOWEL SYNDROME 05/23/2008  . HERPES LABIALIS 02/23/2007  . GERD 02/23/2007  . Human immunodeficiency virus (HIV) disease 02/12/2007  . HERPES ZOSTER, UNCOMPLICATED 02/12/2007  . DEVIATED NASAL SEPTUM 02/12/2007  . ALLERGIC RHINITIS 02/12/2007  . PSORIASIS 02/12/2007  . DIARRHEA 02/12/2007  . KNEE INJURY 02/12/2007     Lady Deutscher, PTA  02/09/2015, 4:16 PM  [facility] [address] [place], [place], [address] Phone:  [phone]   Fax:  [phone]

## 2015-02-12 NOTE — Addendum Note (Signed)
Addended by: Damon Hargrove, Darl Pikes E on: 02/12/2015 09:34 AM   Modules accepted: Orders

## 2015-02-14 ENCOUNTER — Ambulatory Visit (INDEPENDENT_AMBULATORY_CARE_PROVIDER_SITE_OTHER): Payer: BLUE CROSS/BLUE SHIELD | Admitting: Physical Therapy

## 2015-02-14 DIAGNOSIS — M6281 Muscle weakness (generalized): Secondary | ICD-10-CM

## 2015-02-14 DIAGNOSIS — M542 Cervicalgia: Secondary | ICD-10-CM

## 2015-02-14 NOTE — Therapy (Signed)
[facility] [place] [address] [place], [place], [address] Phone: [phone]   Fax:  [phone]  Physical Therapy Treatment  Patient Details  Name: Craig Morales MRN: [id number] Date of Birth: 1956-02-14 Referring Provider:  Pati Gallo, MD  Encounter Date: 02/14/2015      PT End of Session - 02/14/15 1502    Visit Number 7   Number of Visits 14   Date for PT Re-Evaluation 03/09/15   PT Start Time 1430   PT Stop Time 1515   PT Time Calculation (min) 45 min   Activity Tolerance Patient tolerated treatment well   Behavior During Therapy [facility] for tasks assessed/performed      Past Medical History  Diagnosis Date  . Seasonal allergies   . HIV (human immunodeficiency virus infection)     Past Surgical History  Procedure Laterality Date  . Knee surgery    . Nasal septum surgery      There were no vitals taken for this visit.  Visit Diagnosis:  Pain in neck  Generalized muscle weakness      Subjective Assessment - 02/14/15 1432    Symptoms Feels "ok" just tight today on L side   Limitations Reading   Currently in Pain? No/denies                    [facility] Adult PT Treatment/Exercise - 02/14/15 1433    Neck Exercises: Machines for Strengthening   UBE (Upper Arm Bike) L 5 alt 2 min forward/2 min backward x 8 min   Neck Exercises: Supine   Shoulder Flexion Right;Left;15 reps   Shoulder Flexion Limitations x2 sets with green theraband   Upper Extremity D1 Flexion;Extension;15 reps;Theraband   Theraband Level (UE D1) Level 3 (Green)   Upper Extremity D2 Flexion;Extension;15 reps;Theraband   Theraband Level (UE D2) Level 3 (Green)   Other Supine Exercise supine on 1/2 foam roll with arms out x 5 min   Other Supine Exercise horizontal abduction 2x15 reps with green t-band on 1/2 foam roll   Modalities   Modalities Electrical Stimulation;Moist Heat   Moist Heat Therapy   Number Minutes Moist Heat 15  Minutes   Moist Heat Location Other (comment)  neck   Electrical Stimulation   Electrical Stimulation Location neck, L shoulder   Electrical Stimulation Action IFC   Electrical Stimulation Parameters to tolerance   Electrical Stimulation Goals Pain                  PT Short Term Goals - 01/26/15 1252    PT SHORT TERM GOAL #1   Title Be independent with intial HEP    Time 2   Period Weeks   Status Achieved   PT SHORT TERM GOAL #2   Title Report pain decrease    Time 2   Period Weeks   Status Achieved   PT SHORT TERM GOAL #3   Title ROM to be WNL for safe driving    Time 2   Period Weeks   Status On-going           PT Long Term Goals - 02/09/15 1608    PT LONG TERM GOAL #5   Title --  unable to assess system down               Plan - 02/14/15 1503    Clinical Impression Statement No c/o pain today.  Progressing towards goals.  Occasional episodes of R UE tingling  with exercises.   PT Next Visit Plan continue with posture and functional ROM and strengthening to assist with work duties.    Consulted and Agree with Plan of Care Patient        Problem List Patient Active Problem List   Diagnosis Date Noted  . Visit for preventive health examination 10/07/2013  . Gout 10/07/2013  . History of calcium pyrophosphate deposition disease (CPPD) 07/25/2013  . Degenerative arthritis of spine 11/27/2011  . EPISTAXIS, RECURRENT 11/26/2010  . IRRITABLE BOWEL SYNDROME 05/23/2008  . HERPES LABIALIS 02/23/2007  . GERD 02/23/2007  . Human immunodeficiency virus (HIV) disease 02/12/2007  . HERPES ZOSTER, UNCOMPLICATED 02/12/2007  . DEVIATED NASAL SEPTUM 02/12/2007  . ALLERGIC RHINITIS 02/12/2007  . PSORIASIS 02/12/2007  . DIARRHEA 02/12/2007  . KNEE INJURY 02/12/2007   [name], PT, DPT 02/14/2015 4:01 PM  [facility] [address] [place], [place], [address] Phone: [phone]    Fax:  [phone]

## 2015-02-21 ENCOUNTER — Encounter: Payer: Self-pay | Admitting: Physical Therapy

## 2015-02-21 ENCOUNTER — Ambulatory Visit (INDEPENDENT_AMBULATORY_CARE_PROVIDER_SITE_OTHER): Payer: BLUE CROSS/BLUE SHIELD | Admitting: Physical Therapy

## 2015-02-21 DIAGNOSIS — M6281 Muscle weakness (generalized): Secondary | ICD-10-CM

## 2015-02-21 DIAGNOSIS — M2560 Stiffness of unspecified joint, not elsewhere classified: Secondary | ICD-10-CM

## 2015-02-21 DIAGNOSIS — M542 Cervicalgia: Secondary | ICD-10-CM

## 2015-02-21 NOTE — Therapy (Signed)
[facility] [place] [address] [place], [place], [address] Phone: [phone]   Fax:  [phone]  Physical Therapy Treatment  Patient Details  Name: Craig Morales MRN: [id number] Date of Birth: 11/28/1956 Referring Provider:  Pati Gallo, MD  Encounter Date: 02/21/2015      PT End of Session - 02/21/15 1608    Visit Number 8   Number of Visits 14   Date for PT Re-Evaluation 03/09/15   PT Start Time 1608   PT Stop Time 1701   PT Time Calculation (min) 53 min   Activity Tolerance Patient tolerated treatment well      Past Medical History  Diagnosis Date  . Seasonal allergies   . HIV (human immunodeficiency virus infection)     Past Surgical History  Procedure Laterality Date  . Knee surgery    . Nasal septum surgery      There were no vitals filed for this visit.  Visit Diagnosis:  Pain in neck  Generalized muscle weakness  Stiffness of joints, multiple sites      Subjective Assessment - 02/21/15 1608    Symptoms feels stiff as usual, more on the left. Has been so busy at work he hasn't been able to do his HEP as much lately. Started taking prednisone this week for his RA and will see if this hleps.    Currently in Pain? No/denies  stiffness            [facility] PT Assessment - 02/21/15 0001    AROM   AROM Assessment Site Cervical   Cervical Flexion 50   Cervical Extension 40   Cervical - Right Rotation 35   Cervical - Left Rotation 38                   [facility] Adult PT Treatment/Exercise - 02/21/15 0001    Neck Exercises: Machines for Strengthening   UBE (Upper Arm Bike) L 5 alt 2 min forward/2 min backward x 8 min   Neck Exercises: Supine   Other Supine Exercise thoracic extension over full foam roller horizontally.   Shoulder Exercises: Supine   Horizontal ABduction 20 reps;Theraband  green band on foam roller   Flexion 20 reps;Both;Theraband  on foam bolster   Theraband Level  (Shoulder Flexion) Level 3 (Green)   Other Supine Exercises green tband diagonals 2x10 on full foam roller   Moist Heat Therapy   Number Minutes Moist Heat 15 Minutes   Moist Heat Location --  thoracic   Electrical Stimulation   Electrical Stimulation Location cerv/thoracic paraspinals   Electrical Stimulation Action IFC x 15 min   Electrical Stimulation Parameters to tolerance   Electrical Stimulation Goals Pain   Manual Therapy   Manual Therapy Joint mobilization;Passive ROM   Joint Mobilization hypomobile throgh cspine and tspine with cpa mobs. Grade 3 UPA mobs left C7. Increased RT rot to 50 deg after, left rot decreased to 20 deg.   Passive ROM Seated thoracic ext, SB and rotation bil  Increased left cerv rot to 50 deg after seated PROM                  PT Short Term Goals - 02/21/15 1611    PT SHORT TERM GOAL #1   Title Be independent with intial HEP    Status Achieved   PT SHORT TERM GOAL #2   Title Report pain decrease    Status Achieved   PT SHORT TERM GOAL #3  Title ROM to be WNL for safe driving   pt reports he can turn his head easier.  Will still turn his body alittle   Status On-going           PT Long Term Goals - 02/21/15 1612    PT LONG TERM GOAL #1   Title Demonstrate and/or verbalize techniques to reduce risk of re-injury to include information on posture and body mechanics.    Status Achieved   PT LONG TERM GOAL #2   Title Be independent with advanced HEP    Status Achieved   PT LONG TERM GOAL #3   Title Report pain decrease no greater than a 1/10 80% of the day to perform work activity  reports 10% improvement   Status On-going   PT LONG TERM GOAL #4   Title Tolerate sitting for work duties   Status On-going               Plan - 02/21/15 1641    Clinical Impression Statement Patient demos decreased thoracic mobility which may be contributing to cervical mobility restrictions. Tolerated mobs well.    Pt will benefit from  skilled therapeutic intervention in order to improve on the following deficits Decreased range of motion;Decreased mobility;Pain   Rehab Potential Good   PT Frequency 2x / week   PT Duration 4 weeks   PT Treatment/Interventions Electrical Stimulation;Moist Heat;Therapeutic exercise;Manual techniques;Passive range of motion   PT Next Visit Plan continue with PROM in sitting for thoracic mobility and functional ROM and strengthening.   Consulted and Agree with Plan of Care Patient        Problem List Patient Active Problem List   Diagnosis Date Noted  . Visit for preventive health examination 10/07/2013  . Gout 10/07/2013  . History of calcium pyrophosphate deposition disease (CPPD) 07/25/2013  . Degenerative arthritis of spine 11/27/2011  . EPISTAXIS, RECURRENT 11/26/2010  . IRRITABLE BOWEL SYNDROME 05/23/2008  . HERPES LABIALIS 02/23/2007  . GERD 02/23/2007  . Human immunodeficiency virus (HIV) disease 02/12/2007  . HERPES ZOSTER, UNCOMPLICATED 02/12/2007  . DEVIATED NASAL SEPTUM 02/12/2007  . ALLERGIC RHINITIS 02/12/2007  . PSORIASIS 02/12/2007  . DIARRHEA 02/12/2007  . KNEE INJURY 02/12/2007    Roderic Scarce PT 02/21/2015, 4:55 PM  [facility] [address] [place], [place], [address] Phone: [phone]   Fax:  [phone]

## 2015-02-23 ENCOUNTER — Ambulatory Visit (INDEPENDENT_AMBULATORY_CARE_PROVIDER_SITE_OTHER): Payer: BLUE CROSS/BLUE SHIELD | Admitting: Physical Therapy

## 2015-02-23 DIAGNOSIS — M2560 Stiffness of unspecified joint, not elsewhere classified: Secondary | ICD-10-CM

## 2015-02-23 DIAGNOSIS — M6281 Muscle weakness (generalized): Secondary | ICD-10-CM

## 2015-02-23 NOTE — Therapy (Addendum)
[place], [place], [address] Phone: [phone]   Fax:  [phone]  Physical Therapy Treatment  Patient Details  Name: Craig Morales MRN: [id number] Date of Birth: 01-Dec-1956 Referring Provider:  Berle Mull, MD  Encounter Date: 02/23/2015      PT End of Session - 02/23/15 1438    Visit Number 9   Number of Visits 14   Date for PT Re-Evaluation 03/09/15   PT Start Time 0201   PT Stop Time 0239   PT Time Calculation (min) 38 min   Activity Tolerance Patient tolerated treatment well   Behavior During Therapy [place] for tasks assessed/performed      Past Medical History  Diagnosis Date  . Seasonal allergies   . HIV (human immunodeficiency virus infection)     Past Surgical History  Procedure Laterality Date  . Knee surgery    . Nasal septum surgery      There were no vitals filed for this visit.  Visit Diagnosis:  Generalized muscle weakness  Stiffness of joints, multiple sites      Subjective Assessment - 02/23/15 1405    Symptoms upper back and neck feels a little looser.    Currently in Pain? No/denies                       [facility] Adult PT Treatment/Exercise - 02/23/15 0001    Neck Exercises: Machines for Strengthening   UBE (Upper Arm Bike) level 5 X 8  Level 5 X 8 min.    Neck Exercises: Standing   Other Standing Exercises standing extension with mat tool to stretch abs & strengthen upper thoracic   Other Standing Exercises arm swing in ffunctional lunges ROM & strength thoracic and neck  wtih head turns   Shoulder Exercises: Supine   Other Supine Exercises thoracic self mobs with sm. roller  several positions to target T8-T5   Shoulder Exercises: Standing   Retraction Both;20 reps  back to wall, elbows press lift chest for strength and ROM   Other Standing Exercises 3 # functional shoulder and upper back   shoulder press with lunge and side reach  blilateral   Other Standing Exercises 3# functional lunge with reach to opposite side bilateral  then functional lunge swing yoga mat roll for ROM thoracic s                  PT Short Term Goals - 02/21/15 1611    PT SHORT TERM GOAL #1   Title Be independent with intial HEP    Status Achieved   PT SHORT TERM GOAL #2   Title Report pain decrease    Status Achieved   PT SHORT TERM GOAL #3   Title ROM to be WNL for safe driving   pt reports he can turn his head easier.  Will still turn his body alittle   Status On-going           PT Long Term Goals - 02/21/15 1612    PT LONG TERM GOAL #1   Title Demonstrate and/or verbalize techniques to reduce risk of re-injury to include information on posture and body mechanics.    Status Achieved   PT LONG TERM GOAL #2   Title Be independent with advanced HEP    Status Achieved   PT LONG TERM GOAL #3   Title Report pain decrease no greater than a 1/10 80% of the day to  perform work activity  reports 10% improvement   Status On-going   PT LONG TERM GOAL #4   Title Tolerate sitting for work duties   Status On-going               Plan - 02/23/15 1440    Clinical Impression Statement Patient is doing well with all ther ex and felt much looser" today after treatment.   Rehab Potential Good   PT Frequency 2x / week   PT Duration 4 weeks   PT Treatment/Interventions Electrical Stimulation;Moist Heat;Therapeutic exercise;Manual techniques;Passive range of motion   PT Next Visit Plan continue with PROM in sitting for thoracic mobility and functional ROM and strengthening.        Problem List Patient Active Problem List   Diagnosis Date Noted  . Visit for preventive health examination 10/07/2013  . Gout 10/07/2013  . History of calcium pyrophosphate deposition disease (CPPD) 07/25/2013  . Degenerative arthritis of spine 11/27/2011  . EPISTAXIS, RECURRENT 11/26/2010  . IRRITABLE BOWEL SYNDROME 05/23/2008  . HERPES  LABIALIS 02/23/2007  . GERD 02/23/2007  . Human immunodeficiency virus (HIV) disease 02/12/2007  . HERPES ZOSTER, UNCOMPLICATED 48/27/0786  . DEVIATED NASAL SEPTUM 02/12/2007  . ALLERGIC RHINITIS 02/12/2007  . PSORIASIS 02/12/2007  . DIARRHEA 02/12/2007  . KNEE INJURY 02/12/2007    Natividad Brood, PTA  02/23/2015, 2:43 PM  [facility] [place], [place], [address] Phone: [phone]   Fax:  [phone]     PHYSICAL THERAPY DISCHARGE SUMMARY  Visits from Start of Care: *9  Current functional level related to goals / functional outcomes: *unknown   Remaining deficits: *Unknown   Education / Equipment: *HEP Plan: Patient agrees to discharge.  Patient goals were partially met. Patient is being discharged due to not returning since the last visit.  ?????    Jeral Pinch, PT

## 2015-03-15 NOTE — Therapy (Signed)
[facility] [place] [address] [place], [place], [address] Phone: [phone]   Fax:  [phone]  Physical Therapy Treatment  Patient Details  Name: Craig Morales MRN: [id number] Date of Birth: 1956-02-06 Referring Provider:  Pati Gallo, MD  Encounter Date: 02/23/2015    Past Medical History  Diagnosis Date  . Seasonal allergies   . HIV (human immunodeficiency virus infection)     Past Surgical History  Procedure Laterality Date  . Knee surgery    . Nasal septum surgery      There were no vitals filed for this visit.  Visit Diagnosis:  Generalized muscle weakness  Stiffness of joints, multiple sites                               PT Short Term Goals - 02/21/15 1611    PT SHORT TERM GOAL #1   Title Be independent with intial HEP    Status Achieved   PT SHORT TERM GOAL #2   Title Report pain decrease    Status Achieved   PT SHORT TERM GOAL #3   Title ROM to be WNL for safe driving   pt reports he can turn his head easier.  Will still turn his body alittle   Status On-going           PT Long Term Goals - 02/21/15 1612    PT LONG TERM GOAL #1   Title Demonstrate and/or verbalize techniques to reduce risk of re-injury to include information on posture and body mechanics.    Status Achieved   PT LONG TERM GOAL #2   Title Be independent with advanced HEP    Status Achieved   PT LONG TERM GOAL #3   Title Report pain decrease no greater than a 1/10 80% of the day to perform work activity  reports 10% improvement   Status On-going   PT LONG TERM GOAL #4   Title Tolerate sitting for work duties   Status On-going               Problem List Patient Active Problem List   Diagnosis Date Noted  . Visit for preventive health examination 10/07/2013  . Gout 10/07/2013  . History of calcium pyrophosphate deposition disease (CPPD) 07/25/2013  . Degenerative arthritis of spine  11/27/2011  . EPISTAXIS, RECURRENT 11/26/2010  . IRRITABLE BOWEL SYNDROME 05/23/2008  . HERPES LABIALIS 02/23/2007  . GERD 02/23/2007  . Human immunodeficiency virus (HIV) disease 02/12/2007  . HERPES ZOSTER, UNCOMPLICATED 02/12/2007  . DEVIATED NASAL SEPTUM 02/12/2007  . ALLERGIC RHINITIS 02/12/2007  . PSORIASIS 02/12/2007  . DIARRHEA 02/12/2007  . KNEE INJURY 02/12/2007    Lanae Federer,SUE 03/15/2015, 6:05 PM  [facility] [address] [place], [place], [address] Phone: [phone]   Fax:  [phone]

## 2015-03-25 ENCOUNTER — Other Ambulatory Visit: Payer: Self-pay | Admitting: Physician Assistant

## 2015-05-14 ENCOUNTER — Other Ambulatory Visit: Payer: BLUE CROSS/BLUE SHIELD

## 2015-05-14 DIAGNOSIS — B20 Human immunodeficiency virus [HIV] disease: Secondary | ICD-10-CM

## 2015-05-15 LAB — LIPID PANEL
CHOL/HDL RATIO: 2.9 ratio
Cholesterol: 165 mg/dL (ref 0–200)
HDL: 56 mg/dL (ref >=40)
LDL CALC: 90 mg/dL (ref 0–99)
TRIGLYCERIDES: 94 mg/dL (ref <150)
VLDL: 19 mg/dL (ref 0–40)

## 2015-05-15 LAB — CBC
HEMATOCRIT: 39 % (ref 39.0–52.0)
Hemoglobin: 13.3 g/dL (ref 13.0–17.0)
MCH: 31.6 pg (ref 26.0–34.0)
MCHC: 34.1 g/dL (ref 30.0–36.0)
MCV: 92.6 fL (ref 78.0–100.0)
MPV: 9.8 fL (ref 8.6–12.4)
Platelets: 207 10*3/uL (ref 150–400)
RBC: 4.21 MIL/uL — ABNORMAL LOW (ref 4.22–5.81)
RDW: 14.8 % (ref 11.5–15.5)
WBC: 5 10*3/uL (ref 4.0–10.5)

## 2015-05-15 LAB — RPR

## 2015-05-16 LAB — HIV-1 RNA QUANT-NO REFLEX-BLD

## 2015-05-16 LAB — T-HELPER CELL (CD4) - (RCID CLINIC ONLY)
CD4 % Helper T Cell: 33 % (ref 33–55)
CD4 T CELL ABS: 620 /uL (ref 400–2700)

## 2015-05-17 ENCOUNTER — Other Ambulatory Visit: Payer: 59

## 2015-05-23 ENCOUNTER — Ambulatory Visit (INDEPENDENT_AMBULATORY_CARE_PROVIDER_SITE_OTHER): Payer: BLUE CROSS/BLUE SHIELD | Admitting: Internal Medicine

## 2015-05-23 ENCOUNTER — Encounter: Payer: Self-pay | Admitting: Internal Medicine

## 2015-05-23 DIAGNOSIS — B20 Human immunodeficiency virus [HIV] disease: Secondary | ICD-10-CM

## 2015-05-23 NOTE — Progress Notes (Signed)
Patient ID: Craig Morales, male   DOB: [birth year], 59 y.o.   MRN: [id number]          Patient Active Problem List   Diagnosis Date Noted  . Human immunodeficiency virus (HIV) disease 02/12/2007    Priority: High  . History of calcium pyrophosphate deposition disease (CPPD) 07/25/2013    Priority: Medium  . Visit for preventive health examination 10/07/2013  . Gout 10/07/2013  . Degenerative arthritis of spine 11/27/2011  . EPISTAXIS, RECURRENT 11/26/2010  . IRRITABLE BOWEL SYNDROME 05/23/2008  . HERPES LABIALIS 02/23/2007  . GERD 02/23/2007  . HERPES ZOSTER, UNCOMPLICATED 02/12/2007  . DEVIATED NASAL SEPTUM 02/12/2007  . ALLERGIC RHINITIS 02/12/2007  . PSORIASIS 02/12/2007  . DIARRHEA 02/12/2007  . KNEE INJURY 02/12/2007    Patient's Medications  New Prescriptions   No medications on file  Previous Medications   ASCORBIC ACID (VITAMIN C WITH ROSE HIPS) 500 MG TABLET    Take 500 mg by mouth daily.   AZELASTINE HCL 0.15 % SOLN    Place 2 sprays into the nose daily.   DEXLANSOPRAZOLE (DEXILANT) 60 MG CAPSULE    Take 60 mg by mouth daily.   DEXTROMETHORPHAN-GUAIFENESIN (MUCINEX DM) 30-600 MG PER 12 HR TABLET    Take 1 tablet by mouth daily as needed.   EFAVIRENZ-EMTRICITABINE-TENOFOVIR (ATRIPLA) 600-200-300 MG PER TABLET    Take 1 tablet by mouth  daily   FLUTICASONE (FLONASE) 50 MCG/ACT NASAL SPRAY    USE TWO SPRAY(S) IN EACH NOSTRIL ONCE DAILY   FOLIC ACID (FOLVITE) 1 MG TABLET    Take 1 mg by mouth daily.   HYDROXYCHLOROQUINE (PLAQUENIL) 200 MG TABLET    Take 200 mg by mouth 3 (three) times daily.   KRILL OIL OMEGA-3 PO    Take 1 each by mouth daily.   MELATONIN 5 MG CAPS    Take 1 each by mouth at bedtime.   METHOTREXATE 2.5 MG TABLET    Take 12.5 mg by mouth. Every Sunday and every Monday   MISC NATURAL PRODUCTS (OSTEO BI-FLEX ADV JOINT SHIELD) TABS    Take 1 each by mouth daily.   MULTIPLE VITAMINS-MINERALS (CENTRUM SILVER ADULT 50+ PO)    Take 1 each by mouth daily.   PREGABALIN (LYRICA) 75 MG CAPSULE    Take 75 mg by mouth daily.    VALACYCLOVIR (VALTREX) 500 MG TABLET    Take 1 tablet (500 mg total) by mouth 2 (two) times daily. Take 1 tablet (500 mg total) by mouth 2 (two) times daily as needed.   VITAMIN E 400 UNIT CAPSULE    Take 400 Units by mouth daily.  Modified Medications   No medications on file  Discontinued Medications   PREDNISONE (DELTASONE) 10 MG TABLET    Take 5 mg by mouth every other day.     Subjective: Craig Morales is in for his routine HIV follow-up visit. As usual he has not had any problems obtaining, taking or tolerating his Atripla and has not been missing any doses. He has been under a fair amount of stress recently. His mother was diagnosed with rectal cancer in January and has been undergoing chemotherapy and radiation therapy. Unfortunately the therapies had be stopped because she developed a diabetic foot infection and is currently on treatment for that. He is making trips back and forth to [facility] about twice monthly. His dearly beloved dog is also been having more problems with Cushing's disease. Craig Morales remains on Plaquenil and methotrexate for his  arthritis. He still has some pain, stiffness and swelling in his wrists. Review of Systems: Pertinent items are noted in HPI.  Past Medical History  Diagnosis Date  . Seasonal allergies   . HIV (human immunodeficiency virus infection)     History  Substance Use Topics  . Smoking status: Never Smoker   . Smokeless tobacco: Never Used  . Alcohol Use: 4.2 oz/week    7 Standard drinks or equivalent per week     Comment: occasional    No family history on file.  Allergies  Allergen Reactions  . Oxycontin [Oxycodone Hcl] Shortness Of Breath and Palpitations  . Hydrocodone     REACTION: SOB and chest pain    Objective: Temp: 98.6 F (37 C) (06/15 1558) Temp Source: Oral (06/15 1558) BP: 116/69 mmHg (06/15 1558) Pulse Rate: 70 (06/15 1558) Body mass index is 21.7  kg/(m^2).  General: He is in very good spirits as usual Oral: No oropharyngeal lesions Skin: No rash Lungs: Clear Cor: Regular S1 and S2 no murmurs Joints and extremities: Mild diffuse swelling of both wrists   Lab Results Lab Results  Component Value Date   WBC 5.0 05/14/2015   HGB 13.3 05/14/2015   HCT 39.0 05/14/2015   MCV 92.6 05/14/2015   PLT 207 05/14/2015    Lab Results  Component Value Date   CREATININE 0.92 05/11/2014   BUN 16 05/11/2014   NA 138 05/11/2014   K 4.3 05/11/2014   CL 103 05/11/2014   CO2 26 05/11/2014    Lab Results  Component Value Date   ALT 18 05/11/2014   AST 24 05/11/2014   ALKPHOS 77 05/11/2014   BILITOT 0.3 05/11/2014    Lab Results  Component Value Date   CHOL 165 05/14/2015   HDL 56 05/14/2015   LDLCALC 90 05/14/2015   TRIG 94 05/14/2015   CHOLHDL 2.9 05/14/2015    Lab Results HIV 1 RNA QUANT (copies/mL)  Date Value  05/14/2015 <20  11/22/2014 <20  05/11/2014 <20   CD4 T CELL ABS (/uL)  Date Value  05/14/2015 620  11/22/2014 610  05/11/2014 650     Assessment: His HIV infection remains under excellent control. I discussed with him therapeutic options with newer agents that might be safer such as Genvoya or Odefsey. Each of those agents contained the new preparation of tenofovir which carries a significantly lower risk of nephrotoxicity and osteoporosis. Have given him written information about both agents and he will consider a possible switch.  Plan: 1. Continue Atripla for now while he does research on possible newer treatment options 2. Follow-up in 6 months   Cliffton Asters, MD [facility] for Infectious Disease [facility] [phone] pager   [phone] cell 05/23/2015, 4:25 PM

## 2015-05-31 ENCOUNTER — Ambulatory Visit: Payer: 59 | Admitting: Internal Medicine

## 2015-06-04 ENCOUNTER — Other Ambulatory Visit: Payer: Self-pay | Admitting: Internal Medicine

## 2015-07-13 ENCOUNTER — Other Ambulatory Visit: Payer: Self-pay | Admitting: Licensed Clinical Social Worker

## 2015-07-13 ENCOUNTER — Encounter: Payer: Self-pay | Admitting: Internal Medicine

## 2015-07-13 MED ORDER — ELVITEG-COBIC-EMTRICIT-TENOFAF 150-150-200-10 MG PO TABS: 1.0000 | ORAL_TABLET | Freq: Every day | ORAL | Status: DC

## 2015-07-23 ENCOUNTER — Other Ambulatory Visit: Payer: Self-pay

## 2015-07-23 NOTE — Telephone Encounter (Signed)
Pharmacy is calling for refill of Atripla. They also stated they have the Genvoya.  Pharmacy was advised pt is only to take one of the medications not both.  Last note states he was going to consider taking Genvoya and script was sent to pharmacy.   Pharmacy advised to check with patient to see which medication he would like to refill.   Laurell Josephs, RN

## 2015-07-24 ENCOUNTER — Telehealth: Payer: Self-pay | Admitting: *Deleted

## 2015-07-24 DIAGNOSIS — B20 Human immunodeficiency virus [HIV] disease: Secondary | ICD-10-CM

## 2015-07-24 MED ORDER — ELVITEG-COBIC-EMTRICIT-TENOFAF 150-150-200-10 MG PO TABS: 1.0000 | ORAL_TABLET | Freq: Every day | ORAL | Status: DC

## 2015-07-24 NOTE — Telephone Encounter (Signed)
Pt is taking Genvoya, Atripla was stopped.

## 2015-07-24 NOTE — Telephone Encounter (Signed)
Received refill request from [facility] for Atripla. Patient was switched to [facility] on 8/5.  Left message asking patient which medication he was taking. Andree Coss, RN

## 2015-07-24 NOTE — Addendum Note (Signed)
Addended by: Jennet Maduro D on: 07/24/2015 12:12 PM   Modules accepted: Orders

## 2015-11-15 ENCOUNTER — Other Ambulatory Visit: Payer: BLUE CROSS/BLUE SHIELD

## 2015-11-15 DIAGNOSIS — B20 Human immunodeficiency virus [HIV] disease: Secondary | ICD-10-CM

## 2015-11-15 LAB — CBC
HCT: 39.5 % (ref 39.0–52.0)
HEMOGLOBIN: 13.4 g/dL (ref 13.0–17.0)
MCH: 31.8 pg (ref 26.0–34.0)
MCHC: 33.9 g/dL (ref 30.0–36.0)
MCV: 93.8 fL (ref 78.0–100.0)
MPV: 9.7 fL (ref 8.6–12.4)
Platelets: 245 10*3/uL (ref 150–400)
RBC: 4.21 MIL/uL — ABNORMAL LOW (ref 4.22–5.81)
RDW: 14.7 % (ref 11.5–15.5)
WBC: 5.9 10*3/uL (ref 4.0–10.5)

## 2015-11-15 LAB — COMPREHENSIVE METABOLIC PANEL
ALBUMIN: 3.9 g/dL (ref 3.6–5.1)
ALT: 17 U/L (ref 9–46)
AST: 22 U/L (ref 10–35)
Alkaline Phosphatase: 63 U/L (ref 40–115)
BUN: 10 mg/dL (ref 7–25)
CALCIUM: 9.2 mg/dL (ref 8.6–10.3)
CHLORIDE: 102 mmol/L (ref 98–110)
CO2: 32 mmol/L — AB (ref 20–31)
Creat: 0.99 mg/dL (ref 0.70–1.33)
GLUCOSE: 91 mg/dL (ref 65–99)
POTASSIUM: 4.1 mmol/L (ref 3.5–5.3)
Sodium: 140 mmol/L (ref 135–146)
Total Bilirubin: 0.4 mg/dL (ref 0.2–1.2)
Total Protein: 6.6 g/dL (ref 6.1–8.1)

## 2015-11-16 LAB — T-HELPER CELL (CD4) - (RCID CLINIC ONLY)
CD4 T CELL ABS: 630 /uL (ref 400–2700)
CD4 T CELL HELPER: 38 % (ref 33–55)

## 2015-11-19 LAB — HIV-1 RNA QUANT-NO REFLEX-BLD: HIV-1 RNA Quant, Log: <1.3 Log copies/mL (ref <1.30)

## 2015-11-27 NOTE — Addendum Note (Signed)
Addended by: Abygail Galeno, Darl Pikes E on: 11/27/2015 08:41 AM   Modules accepted: Orders

## 2015-11-29 ENCOUNTER — Ambulatory Visit (INDEPENDENT_AMBULATORY_CARE_PROVIDER_SITE_OTHER): Payer: BLUE CROSS/BLUE SHIELD | Admitting: Internal Medicine

## 2015-11-29 DIAGNOSIS — B20 Human immunodeficiency virus [HIV] disease: Secondary | ICD-10-CM

## 2015-11-29 NOTE — Assessment & Plan Note (Signed)
His HIV infection remains under excellent control. I asked him to move his Genvoya dose up so that he takes it right after dinner each evening. He will follow-up after lab work in 6 months.

## 2015-11-29 NOTE — Progress Notes (Signed)
Patient Active Problem List   Diagnosis Date Noted  . Human immunodeficiency virus (HIV) disease (HCC) 02/12/2007    Priority: High  . History of calcium pyrophosphate deposition disease (CPPD) 07/25/2013    Priority: Medium  . Visit for preventive health examination 10/07/2013  . Gout 10/07/2013  . Degenerative arthritis of spine 11/27/2011  . EPISTAXIS, RECURRENT 11/26/2010  . IRRITABLE BOWEL SYNDROME 05/23/2008  . HERPES LABIALIS 02/23/2007  . GERD 02/23/2007  . HERPES ZOSTER, UNCOMPLICATED 02/12/2007  . DEVIATED NASAL SEPTUM 02/12/2007  . ALLERGIC RHINITIS 02/12/2007  . PSORIASIS 02/12/2007  . DIARRHEA 02/12/2007  . KNEE INJURY 02/12/2007    Patient's Medications  New Prescriptions   No medications on file  Previous Medications   ASCORBIC ACID (VITAMIN C WITH ROSE HIPS) 500 MG TABLET    Take 500 mg by mouth daily.   AZELASTINE HCL 0.15 % SOLN    Place 2 sprays into the nose daily.   DEXLANSOPRAZOLE (DEXILANT) 60 MG CAPSULE    Take 60 mg by mouth daily.   DEXTROMETHORPHAN-GUAIFENESIN (MUCINEX DM) 30-600 MG PER 12 HR TABLET    Take 1 tablet by mouth daily as needed.   ELVITEGRAVIR-COBICISTAT-EMTRICITABINE-TENOFOVIR (GENVOYA) 150-150-200-10 MG TABS TABLET    Take 1 tablet by mouth daily with breakfast.   FLUTICASONE (FLONASE) 50 MCG/ACT NASAL SPRAY    USE TWO SPRAY(S) IN EACH NOSTRIL ONCE DAILY   FOLIC ACID (FOLVITE) 1 MG TABLET    Take 1 mg by mouth daily.   HYDROXYCHLOROQUINE (PLAQUENIL) 200 MG TABLET    Take 200 mg by mouth daily. Take 400 mg daily   KRILL OIL OMEGA-3 PO    Take 1 each by mouth daily.   MELATONIN 5 MG CAPS    Take 1 each by mouth at bedtime.   METHOTREXATE 2.5 MG TABLET    Take 12.5 mg by mouth. Every Monday and every Tuesday   MISC NATURAL PRODUCTS (OSTEO BI-FLEX ADV JOINT SHIELD) TABS    Take 1 each by mouth daily.   MULTIPLE VITAMINS-MINERALS (CENTRUM SILVER ADULT 50+ PO)    Take 1 each by mouth daily.   PREDNISONE (DELTASONE) 10 MG TABLET     as needed.   PREGABALIN (LYRICA) 75 MG CAPSULE    Take 75 mg by mouth daily.    VALACYCLOVIR (VALTREX) 500 MG TABLET    Take 1 tablet (500 mg total) by mouth 2 (two) times daily. Take 1 tablet (500 mg total) by mouth 2 (two) times daily as needed.   VITAMIN E 400 UNIT CAPSULE    Take 400 Units by mouth daily.  Modified Medications   No medications on file  Discontinued Medications   No medications on file    Subjective: Craig Morales is in for his routine HIV follow-up visit. He has no problems tolerating his Genvoya and takes it each evening for bedtime. He generally takes it about an hour to hour and half after dinner. He has not missed any doses. He has been bothered by his arthritis in his neck, left hip and wrists but remains active and tries to go to the gym several times each week.   Review of Systems: Review of Systems  Constitutional: Negative for fever, chills, weight loss, malaise/fatigue and diaphoresis.  HENT: Negative for sore throat.   Respiratory: Negative for cough, sputum production and shortness of breath.   Cardiovascular: Negative for chest pain.  Gastrointestinal: Negative for nausea, vomiting and diarrhea.  Musculoskeletal: Positive  for joint pain and neck pain. Negative for myalgias.  Skin: Negative for rash.  Neurological: Negative for focal weakness.  Psychiatric/Behavioral: Negative for depression and substance abuse. The patient is not nervous/anxious.     Past Medical History  Diagnosis Date  . Seasonal allergies   . HIV (human immunodeficiency virus infection)     Social History  Substance Use Topics  . Smoking status: Never Smoker   . Smokeless tobacco: Never Used  . Alcohol Use: 4.2 oz/week    7 Standard drinks or equivalent per week     Comment: occasional    No family history on file.  Allergies  Allergen Reactions  . Oxycontin [Oxycodone Hcl] Shortness Of Breath and Palpitations  . Hydrocodone     REACTION: SOB and chest pain     Objective:  Filed Vitals:   11/29/15 1607  BP: 126/79  Pulse: 66  Temp: 98.2 F (36.8 C)  TempSrc: Oral  Height: 5\' 11"  (1.803 m)  Weight: 162 lb (73.483 kg)   Body mass index is 22.6 kg/(m^2).  Physical Exam  Constitutional: He is oriented to person, place, and time.  He is smiling and in good spirits as usual.  HENT:  Mouth/Throat: No oropharyngeal exudate.  Eyes: Conjunctivae are normal.  Cardiovascular: Normal rate and regular rhythm.   No murmur heard. Pulmonary/Chest: Breath sounds normal.  Musculoskeletal: Normal range of motion.  He has swelling of both wrists, left greater than right.  Neurological: He is alert and oriented to person, place, and time.  Skin: No rash noted.  Psychiatric: Mood and affect normal.    Lab Results Lab Results  Component Value Date   WBC 5.9 11/15/2015   HGB 13.4 11/15/2015   HCT 39.5 11/15/2015   MCV 93.8 11/15/2015   PLT 245 11/15/2015    Lab Results  Component Value Date   CREATININE 0.99 11/15/2015   BUN 10 11/15/2015   NA 140 11/15/2015   K 4.1 11/15/2015   CL 102 11/15/2015   CO2 32* 11/15/2015    Lab Results  Component Value Date   ALT 17 11/15/2015   AST 22 11/15/2015   ALKPHOS 63 11/15/2015   BILITOT 0.4 11/15/2015    Lab Results  Component Value Date   CHOL 165 05/14/2015   HDL 56 05/14/2015   LDLCALC 90 05/14/2015   TRIG 94 05/14/2015   CHOLHDL 2.9 05/14/2015    Lab Results HIV 1 RNA QUANT (copies/mL)  Date Value  11/15/2015 <20  05/14/2015 <20  11/22/2014 <20   CD4 T CELL ABS (/uL)  Date Value  11/15/2015 630  05/14/2015 620  11/22/2014 610      Problem List Items Addressed This Visit      High   Human immunodeficiency virus (HIV) disease (HCC)    His HIV infection remains under excellent control. I asked him to move his Genvoya dose up so that he takes it right after dinner each evening. He will follow-up after lab work in 6 months.      Relevant Orders   T-helper cell (CD4)-  ([facility] clinic only)   HIV 1 RNA quant-no reflex-bld   CBC   Comprehensive metabolic panel   Lipid panel   RPR        11/24/2014, MD [facility] for Infectious Disease [facility] [phone] pager   [phone] cell 11/29/2015, 4:40 PM

## 2015-12-11 ENCOUNTER — Other Ambulatory Visit: Payer: Self-pay | Admitting: *Deleted

## 2015-12-11 DIAGNOSIS — B20 Human immunodeficiency virus [HIV] disease: Secondary | ICD-10-CM

## 2015-12-11 MED ORDER — ELVITEG-COBIC-EMTRICIT-TENOFAF 150-150-200-10 MG PO TABS: 1.0000 | ORAL_TABLET | Freq: Every day | ORAL | Status: DC

## 2015-12-14 ENCOUNTER — Other Ambulatory Visit: Payer: Self-pay | Admitting: *Deleted

## 2015-12-14 DIAGNOSIS — B20 Human immunodeficiency virus [HIV] disease: Secondary | ICD-10-CM

## 2015-12-14 MED ORDER — ELVITEG-COBIC-EMTRICIT-TENOFAF 150-150-200-10 MG PO TABS: 1.0000 | ORAL_TABLET | Freq: Every day | ORAL | Status: DC

## 2016-04-14 ENCOUNTER — Encounter: Payer: Self-pay | Admitting: Internal Medicine

## 2016-04-14 ENCOUNTER — Other Ambulatory Visit: Payer: Self-pay | Admitting: Internal Medicine

## 2016-04-14 DIAGNOSIS — M069 Rheumatoid arthritis, unspecified: Secondary | ICD-10-CM | POA: Insufficient documentation

## 2016-04-14 MED ORDER — ETANERCEPT 50 MG/ML ~~LOC~~ SOSY: 50.0000 mg | PREFILLED_SYRINGE | SUBCUTANEOUS | Status: DC

## 2016-05-15 ENCOUNTER — Other Ambulatory Visit: Payer: Managed Care, Other (non HMO)

## 2016-05-15 DIAGNOSIS — B20 Human immunodeficiency virus [HIV] disease: Secondary | ICD-10-CM

## 2016-05-16 LAB — LIPID PANEL
Cholesterol: 183 mg/dL (ref 125–200)
HDL: 60 mg/dL (ref >=40)
LDL CALC: 89 mg/dL (ref <130)
TRIGLYCERIDES: 172 mg/dL — AB (ref <150)
Total CHOL/HDL Ratio: 3.1 Ratio (ref <=5.0)
VLDL: 34 mg/dL — ABNORMAL HIGH (ref <30)

## 2016-05-16 LAB — RPR

## 2016-05-16 LAB — COMPREHENSIVE METABOLIC PANEL
ALT: 20 U/L (ref 9–46)
AST: 22 U/L (ref 10–35)
Albumin: 4 g/dL (ref 3.6–5.1)
Alkaline Phosphatase: 35 U/L — ABNORMAL LOW (ref 40–115)
BILIRUBIN TOTAL: 0.5 mg/dL (ref 0.2–1.2)
BUN: 9 mg/dL (ref 7–25)
CALCIUM: 8.8 mg/dL (ref 8.6–10.3)
CHLORIDE: 104 mmol/L (ref 98–110)
CO2: 25 mmol/L (ref 20–31)
Creat: 0.83 mg/dL (ref 0.70–1.25)
Glucose, Bld: 89 mg/dL (ref 65–99)
Potassium: 4 mmol/L (ref 3.5–5.3)
SODIUM: 140 mmol/L (ref 135–146)
Total Protein: 6.4 g/dL (ref 6.1–8.1)

## 2016-05-16 LAB — CBC
HCT: 40.6 % (ref 38.5–50.0)
Hemoglobin: 13.9 g/dL (ref 13.2–17.1)
MCH: 32.6 pg (ref 27.0–33.0)
MCHC: 34.2 g/dL (ref 32.0–36.0)
MCV: 95.3 fL (ref 80.0–100.0)
MPV: 9.3 fL (ref 7.5–12.5)
PLATELETS: 224 10*3/uL (ref 140–400)
RBC: 4.26 MIL/uL (ref 4.20–5.80)
RDW: 14.7 % (ref 11.0–15.0)
WBC: 5.8 10*3/uL (ref 3.8–10.8)

## 2016-05-16 LAB — T-HELPER CELL (CD4) - (RCID CLINIC ONLY)
CD4 T CELL HELPER: 35 % (ref 33–55)
CD4 T Cell Abs: 710 /uL (ref 400–2700)

## 2016-05-16 LAB — HIV-1 RNA QUANT-NO REFLEX-BLD

## 2016-05-29 ENCOUNTER — Encounter: Payer: Self-pay | Admitting: Internal Medicine

## 2016-05-29 ENCOUNTER — Ambulatory Visit (INDEPENDENT_AMBULATORY_CARE_PROVIDER_SITE_OTHER): Payer: Managed Care, Other (non HMO) | Admitting: Internal Medicine

## 2016-05-29 DIAGNOSIS — B20 Human immunodeficiency virus [HIV] disease: Secondary | ICD-10-CM

## 2016-05-29 NOTE — Assessment & Plan Note (Signed)
His HIV infection remains under excellent control with long-term viral suppression and normal CD4 counts. He will continue Genvoya. I have told him that I am comfortable with him shifting to an annual visit with me. He is in agreement with that plan.

## 2016-05-29 NOTE — Progress Notes (Signed)
Patient Active Problem List   Diagnosis Date Noted  . Human immunodeficiency virus (HIV) disease (HCC) 02/12/2007    Priority: High  . Rheumatoid arthritis (HCC) 04/14/2016    Priority: Medium  . History of calcium pyrophosphate deposition disease (CPPD) 07/25/2013    Priority: Medium  . Visit for preventive health examination 10/07/2013  . Gout 10/07/2013  . Degenerative arthritis of spine 11/27/2011  . EPISTAXIS, RECURRENT 11/26/2010  . IRRITABLE BOWEL SYNDROME 05/23/2008  . HERPES LABIALIS 02/23/2007  . GERD 02/23/2007  . HERPES ZOSTER, UNCOMPLICATED 02/12/2007  . DEVIATED NASAL SEPTUM 02/12/2007  . ALLERGIC RHINITIS 02/12/2007  . PSORIASIS 02/12/2007  . DIARRHEA 02/12/2007  . KNEE INJURY 02/12/2007    Patient's Medications  New Prescriptions   No medications on file  Previous Medications   ASCORBIC ACID (VITAMIN C WITH ROSE HIPS) 500 MG TABLET    Take 500 mg by mouth daily.   AZELASTINE HCL 0.15 % SOLN    Place 2 sprays into the nose daily.   DEXLANSOPRAZOLE (DEXILANT) 60 MG CAPSULE    Take 60 mg by mouth daily.   DEXTROMETHORPHAN-GUAIFENESIN (MUCINEX DM) 30-600 MG PER 12 HR TABLET    Take 1 tablet by mouth daily as needed.   ELVITEGRAVIR-COBICISTAT-EMTRICITABINE-TENOFOVIR (GENVOYA) 150-150-200-10 MG TABS TABLET    Take 1 tablet by mouth daily with breakfast.   ETANERCEPT (ENBREL) 50 MG/ML INJECTION    Inject 0.98 mLs (50 mg total) into the skin once a week.   FLUTICASONE (FLONASE) 50 MCG/ACT NASAL SPRAY    USE TWO SPRAY(S) IN EACH NOSTRIL ONCE DAILY   FOLIC ACID (FOLVITE) 1 MG TABLET    Take 1 mg by mouth daily.   KRILL OIL OMEGA-3 PO    Take 1 each by mouth daily.   MELATONIN 5 MG CAPS    Take 1 each by mouth at bedtime.   METHOTREXATE 2.5 MG TABLET    Take 20 mg by mouth once a week.   MISC NATURAL PRODUCTS (OSTEO BI-FLEX ADV JOINT SHIELD) TABS    Take 1 each by mouth daily.   MULTIPLE VITAMINS-MINERALS (CENTRUM SILVER ADULT 50+ PO)    Take 1 each by  mouth daily.   MULTIVITAMIN-LUTEIN (OCUVITE-LUTEIN) CAPS CAPSULE    Take 1 capsule by mouth daily.   PREGABALIN (LYRICA) 75 MG CAPSULE    Take 75 mg by mouth daily. Reported on 05/29/2016   VALACYCLOVIR (VALTREX) 500 MG TABLET    Take 1 tablet (500 mg total) by mouth 2 (two) times daily. Take 1 tablet (500 mg total) by mouth 2 (two) times daily as needed.   VITAMIN E 400 UNIT CAPSULE    Take 400 Units by mouth daily.  Modified Medications   No medications on file  Discontinued Medications   ELVITEGRAVIR-COBICISTAT-EMTRICITABINE-TENOFOVIR (GENVOYA) 150-150-200-10 MG TABS TABLET    Take 1 tablet by mouth daily with breakfast.   PREDNISONE (DELTASONE) 10 MG TABLET    as needed.    Subjective: Craig Morales is in for his routine HIV follow-up visit. He continues to be bothered by stiffness and pain in his joints, especially his wrists and neck but otherwise is doing well. He recently started on Enbrel for his rheumatoid arthritis. He is scheduled to have another steroid injection in his neck soon. He has had no problems obtaining or tolerating his Genvoya. He somewhat sheepishly tells me that he has missed only one dose and it occurred last night when he got distracted administering  his Enbrel.   Review of Systems: Review of Systems  Constitutional: Negative for fever, chills, weight loss, malaise/fatigue and diaphoresis.  HENT: Negative for sore throat.   Respiratory: Negative for cough, sputum production and shortness of breath.   Cardiovascular: Negative for chest pain.  Gastrointestinal: Negative for nausea, vomiting, abdominal pain and diarrhea.  Genitourinary: Negative for dysuria.  Musculoskeletal: Positive for joint pain and neck pain. Negative for myalgias.  Skin: Negative for rash.  Neurological: Negative for dizziness and headaches.  Psychiatric/Behavioral: Negative for depression and substance abuse. The patient is not nervous/anxious.     Past Medical History  Diagnosis Date  . Seasonal  allergies   . HIV (human immunodeficiency virus infection) (HCC)     Social History  Substance Use Topics  . Smoking status: Never Smoker   . Smokeless tobacco: Never Used  . Alcohol Use: 4.2 oz/week    7 Standard drinks or equivalent per week     Comment: occasional    No family history on file.  Allergies  Allergen Reactions  . Oxycontin [Oxycodone Hcl] Shortness Of Breath and Palpitations  . Hydrocodone     REACTION: SOB and chest pain    Objective:  Filed Vitals:   05/29/16 1114  BP: 127/85  Pulse: 62  Temp: 97.9 F (36.6 C)  TempSrc: Oral  Height: 5\' 11"  (1.803 m)  Weight: 168 lb (76.204 kg)   Body mass index is 23.44 kg/(m^2).  Physical Exam  Constitutional: He is oriented to person, place, and time.  He is in good spirits as usual.  HENT:  Mouth/Throat: No oropharyngeal exudate.  Eyes: Conjunctivae are normal.  Cardiovascular: Normal rate and regular rhythm.   No murmur heard. Pulmonary/Chest: Effort normal and breath sounds normal.  Abdominal: Soft. He exhibits no mass. There is no tenderness.  Musculoskeletal: Normal range of motion. He exhibits edema and tenderness.  He has some diffuse swelling around both wrists.  Neurological: He is alert and oriented to person, place, and time.  Skin: No rash noted.  Psychiatric: Mood and affect normal.    Lab Results Lab Results  Component Value Date   WBC 5.8 05/15/2016   HGB 13.9 05/15/2016   HCT 40.6 05/15/2016   MCV 95.3 05/15/2016   PLT 224 05/15/2016    Lab Results  Component Value Date   CREATININE 0.83 05/15/2016   BUN 9 05/15/2016   NA 140 05/15/2016   K 4.0 05/15/2016   CL 104 05/15/2016   CO2 25 05/15/2016    Lab Results  Component Value Date   ALT 20 05/15/2016   AST 22 05/15/2016   ALKPHOS 35* 05/15/2016   BILITOT 0.5 05/15/2016    Lab Results  Component Value Date   CHOL 183 05/15/2016   HDL 60 05/15/2016   LDLCALC 89 05/15/2016   TRIG 172* 05/15/2016   CHOLHDL 3.1  05/15/2016   HIV 1 RNA QUANT (copies/mL)  Date Value  05/15/2016 <20  11/15/2015 <20  05/14/2015 <20   CD4 T CELL ABS (/uL)  Date Value  05/15/2016 710  11/15/2015 630  05/14/2015 620     Problem List Items Addressed This Visit      High   Human immunodeficiency virus (HIV) disease (HCC)    His HIV infection remains under excellent control with long-term viral suppression and normal CD4 counts. He will continue Genvoya. I have told him that I am comfortable with him shifting to an annual visit with me. He is in agreement with that  plan.      Relevant Orders   T-helper cell (CD4)- ([facility] clinic only)   HIV 1 RNA quant-no reflex-bld   CBC   Comprehensive metabolic panel   Lipid panel   RPR        Cliffton Asters, MD [facility] for Infectious Disease [facility] [phone] pager   [phone] cell 05/29/2016, 12:28 PM

## 2016-07-03 ENCOUNTER — Encounter: Payer: Self-pay | Admitting: Internal Medicine

## 2016-10-06 ENCOUNTER — Other Ambulatory Visit: Payer: Self-pay | Admitting: Internal Medicine

## 2016-12-15 ENCOUNTER — Encounter: Payer: Self-pay | Admitting: Internal Medicine

## 2016-12-15 ENCOUNTER — Other Ambulatory Visit: Payer: Self-pay | Admitting: *Deleted

## 2016-12-15 DIAGNOSIS — B20 Human immunodeficiency virus [HIV] disease: Secondary | ICD-10-CM

## 2016-12-15 MED ORDER — ELVITEG-COBIC-EMTRICIT-TENOFAF 150-150-200-10 MG PO TABS: 1.0000 | ORAL_TABLET | Freq: Every day | ORAL | 0 refills | Status: DC

## 2017-03-16 ENCOUNTER — Other Ambulatory Visit: Payer: Self-pay | Admitting: Internal Medicine

## 2017-03-16 DIAGNOSIS — B20 Human immunodeficiency virus [HIV] disease: Secondary | ICD-10-CM

## 2017-03-17 MED ORDER — ELVITEG-COBIC-EMTRICIT-TENOFAF 150-150-200-10 MG PO TABS: 1.0000 | ORAL_TABLET | Freq: Every day | ORAL | 1 refills | Status: DC

## 2017-04-06 ENCOUNTER — Telehealth: Payer: Self-pay | Admitting: *Deleted

## 2017-04-06 LAB — HM COLONOSCOPY

## 2017-04-06 NOTE — Telephone Encounter (Signed)
Patient requested new refills sent to pharmacy, done. RN noted it is time for patient to schedule his follow up appointment. RN reached out via MyChart, message was not answered. RN left voicemail asking patient today to call back and schedule follow up. Andree Coss, RN

## 2017-04-13 ENCOUNTER — Telehealth: Payer: Self-pay | Admitting: General Practice

## 2017-04-13 ENCOUNTER — Telehealth: Payer: Self-pay | Admitting: Physician Assistant

## 2017-04-13 NOTE — Telephone Encounter (Signed)
Patient Name: Craig Morales  DOB: 04-Jul-1956    Initial Comment Caller says , wants an appt, has bruising on skin, blood spots, for the last couple of weeks. He got a spot on arm Sat and a large bruise on shin yesterday.    Nurse Assessment  Nurse: Scarlette Ar, RN, Heather Date/Time (Eastern Time): 04/13/2017 11:23:26 AM  Confirm and document reason for call. If symptomatic, describe symptoms. ---Caller says , wants an appt, has bruising on skin, blood spots, for the last couple of weeks. He got a spot on arm Sat and a large bruise on shin yesterday.  Does the patient have any new or worsening symptoms? ---Yes  Will a triage be completed? ---Yes  Related visit to physician within the last 2 weeks? ---No  Does the PT have any chronic conditions? (i.e. diabetes, asthma, etc.) ---Yes  List chronic conditions. ---HIV  Is this a behavioral health or substance abuse call? ---No     Guidelines    Guideline Title Affirmed Question Affirmed Notes  Bruises [1] Not caused by an injury AND [2] < 5 unexplained bruises    Final Disposition User   See PCP When Office is Open (within 3 days) Standifer, RN, [name] states that he has not been seen at the office for about 3 years, he has an appt with Dr. Carmelia Roller on Wednesday at 7:30 am and he needs to have this issue addressed on Wednesday at his appt.   Referrals  REFERRED TO PCP OFFICE   Disagree/Comply: Comply

## 2017-04-13 NOTE — Telephone Encounter (Signed)
Called patient states he has an appointment scheduled for Wednesday at 7:30 am with Dr. Carmelia Roller.

## 2017-04-13 NOTE — Telephone Encounter (Signed)
Patient called stating that he was a former patient of Malva Cogan but has not been seen in several years. Patient was last seen in 2014. Informed that he needed to re-establish care here with one of the providers. Patient selected Dr. Carmelia Roller. Appointment scheduled for 5/9 to establish care. Patient also stated he think he is having an allergic reaction to some medication. Transferred patient to [facility] for evaluation and advice.

## 2017-04-15 ENCOUNTER — Ambulatory Visit (INDEPENDENT_AMBULATORY_CARE_PROVIDER_SITE_OTHER): Payer: Managed Care, Other (non HMO) | Admitting: Family Medicine

## 2017-04-15 ENCOUNTER — Encounter: Payer: Self-pay | Admitting: Family Medicine

## 2017-04-15 VITALS — BP 100/60 | HR 63 | Temp 97.6°F | Ht 71.0 in | Wt 157.8 lb

## 2017-04-15 DIAGNOSIS — T148XXA Other injury of unspecified body region, initial encounter: Secondary | ICD-10-CM

## 2017-04-15 DIAGNOSIS — Z79631 Long term (current) use of antimetabolite agent: Secondary | ICD-10-CM

## 2017-04-15 DIAGNOSIS — Z79899 Other long term (current) drug therapy: Secondary | ICD-10-CM

## 2017-04-15 NOTE — Progress Notes (Signed)
Pre visit review using our clinic review tool, if applicable. No additional management support is needed unless otherwise documented below in the visit note. 

## 2017-04-15 NOTE — Progress Notes (Signed)
Chief Complaint  Patient presents with  . Establish Care    pt want to discuss bruising on the arm,back, and leg       New Patient Visit SUBJECTIVE: HPI: Craig Morales is an 61 y.o.male who is being seen for re-establishing care.  The patient was seen by another provider in this office.   He has been having bruising on his body, currently his L arm, L leg and lower back, since January 2018. He is on methotrexate for RA and believes it may be a side effect of this medicine. He has no areas of easy bleeding and denies a personal or famhx of easy bruising/bleeding. He is not on anticoagulation or anti-platelet therapy. Of note, he has normal labs (CBC and CMP) 1 mo ago. He has labs through his ID provider in June. He recently had a colonoscopy done, polyp biopsy pending.  Allergies  Allergen Reactions  . Oxycontin [Oxycodone Hcl] Shortness Of Breath and Palpitations  . Hydrocodone     REACTION: SOB and chest pain  . Iodine Diarrhea and Nausea And Vomiting    In high doses.    Past Medical History:  Diagnosis Date  . GERD (gastroesophageal reflux disease)   . HIV (human immunodeficiency virus infection) (HCC)   . Seasonal allergies    Past Surgical History:  Procedure Laterality Date  . KNEE SURGERY    . NASAL SEPTUM SURGERY     Social History   Social History  . Marital status: Married   Social History Main Topics  . Smoking status: Never Smoker  . Smokeless tobacco: Never Used  . Alcohol use 4.2 oz/week    7 Standard drinks or equivalent per week     Comment: occasional  . Drug use: No  . Sexual activity: Not Currently    Birth control/ protection: None     Comment: given condoms   Family History  Problem Relation Age of Onset  . Bleeding Disorder Neg Hx      Current Outpatient Prescriptions:  .  Ascorbic Acid (VITAMIN C WITH ROSE HIPS) 500 MG tablet, Take 500 mg by mouth daily., Disp: , Rfl:  .  Azelastine HCl 0.15 % SOLN, Place 2 sprays into the nose daily.,  Disp: , Rfl:  .  DOCOSAHEXAENOIC ACID PO, Take 1 tablet by mouth daily., Disp: , Rfl:  .  elvitegravir-cobicistat-emtricitabine-tenofovir (GENVOYA) 150-150-200-10 MG TABS tablet, Take 1 tablet by mouth daily with breakfast., Disp: 90 tablet, Rfl: 1 .  etanercept (ENBREL) 50 MG/ML injection, Inject 0.98 mLs (50 mg total) into the skin once a week., Disp: 0.98 mL, Rfl:  .  fluticasone (FLONASE) 50 MCG/ACT nasal spray, USE TWO SPRAY(S) IN EACH NOSTRIL ONCE DAILY, Disp: 16 g, Rfl: 6 .  folic acid (FOLVITE) 1 MG tablet, Take 1 mg by mouth daily., Disp: , Rfl:  .  KRILL OIL OMEGA-3 PO, Take 1 each by mouth daily., Disp: , Rfl:  .  Melatonin 5 MG CAPS, Take 1 each by mouth at bedtime., Disp: , Rfl:  .  methotrexate 2.5 MG tablet, Take 20 mg by mouth once a week., Disp: , Rfl:  .  Misc Natural Products (OSTEO BI-FLEX ADV JOINT SHIELD) TABS, Take 1 each by mouth daily., Disp: , Rfl:  .  Multiple Vitamins-Minerals (CENTRUM SILVER ADULT 50+ PO), Take 1 each by mouth daily., Disp: , Rfl:  .  OVER THE COUNTER MEDICATION, Systane Balance Opht-Apply 1 drop to each eye daily., Disp: , Rfl:  .  pantoprazole (PROTONIX) 40 MG tablet, Take 40 mg by mouth daily., Disp: , Rfl:  .  pregabalin (LYRICA) 75 MG capsule, Take 75 mg by mouth daily. Reported on 05/29/2016, Disp: , Rfl:  .  valACYclovir (VALTREX) 500 MG tablet, Take 1 tablet (500 mg total) by mouth 2 (two) times daily. Take 1 tablet (500 mg total) by mouth 2 (two) times daily as needed., Disp: 30 tablet, Rfl: 5 .  vitamin E 400 UNIT capsule, Take 400 Units by mouth daily., Disp: , Rfl:    ROS Heme: Denies areas of easy bleeding, +bruising  Respiratory: Denies dyspnea   OBJECTIVE: BP 100/60 (BP Location: Left Arm, Patient Position: Sitting, Cuff Size: Normal)   Pulse 63   Temp 97.6 F (36.4 C) (Oral)   Ht 5\' 11"  (1.803 m)   Wt 157 lb 12.8 oz (71.6 kg)   SpO2 98%   BMI 22.01 kg/m   Constitutional: -  VS reviewed -  Well developed, well nourished,  appears stated age -  No apparent distress  Psychiatric: -  Oriented to person, place, and time -  Memory intact -  Affect and mood normal -  Fluent conversation, good eye contact -  Judgment and insight age appropriate  Eye: -  Conjunctivae clear, no discharge -  Pupils symmetric, round, reactive to light  ENMT: -  Nares patent, no D/C -  Oral mucosa without lesions, tongue and uvula midline    Tonsils not enlarged, no erythema, no exudate, trachea midline    Pharynx moist, no lesions, no erythema  Neck: -  No gross swelling, no palpable masses -  Thyroid midline, not enlarged, mobile, no palpable masses  Cardiovascular: -  RRR, no murmurs -  No LE edema  Respiratory: -  Normal respiratory effort, no accessory muscle use, no retraction -  Breath sounds equal, no wheezes, no ronchi, no crackles  Musculoskeletal: -  No clubbing, no cyanosis -  Gait normal  Skin: -  Ecchymosis noted on L forearm, L anterior LE, and RL back -  Warm and dry to palpation   ASSESSMENT/PLAN: Bruising  On methotrexate therapy  I do not see anything abn on his labs. Will be interested to see what his new rheumatologist thinks about bruising being related to Methotrexate. I do not think he needs anticoag labs at this point. Patient should return at his earliest convenience for a CPE. He will likely not need labs. The patient voiced understanding and agreement to the plan.   [place], DO 04/15/17  10:06 AM

## 2017-04-15 NOTE — Patient Instructions (Signed)
Let us know if you need anything.  Schedule your physical after your labs in June.

## 2017-05-19 ENCOUNTER — Encounter: Payer: Self-pay | Admitting: *Deleted

## 2017-05-19 ENCOUNTER — Other Ambulatory Visit: Payer: Self-pay | Admitting: Internal Medicine

## 2017-05-19 ENCOUNTER — Encounter: Payer: Self-pay | Admitting: Internal Medicine

## 2017-05-19 ENCOUNTER — Other Ambulatory Visit: Payer: Managed Care, Other (non HMO)

## 2017-05-19 DIAGNOSIS — R634 Abnormal weight loss: Secondary | ICD-10-CM

## 2017-05-19 DIAGNOSIS — R5383 Other fatigue: Secondary | ICD-10-CM

## 2017-05-19 DIAGNOSIS — B20 Human immunodeficiency virus [HIV] disease: Secondary | ICD-10-CM

## 2017-05-19 LAB — COMPREHENSIVE METABOLIC PANEL
ALT: 17 U/L (ref 9–46)
AST: 19 U/L (ref 10–35)
Albumin: 3.7 g/dL (ref 3.6–5.1)
Alkaline Phosphatase: 43 U/L (ref 40–115)
BILIRUBIN TOTAL: 0.6 mg/dL (ref 0.2–1.2)
BUN: 14 mg/dL (ref 7–25)
CHLORIDE: 102 mmol/L (ref 98–110)
CO2: 32 mmol/L — AB (ref 20–31)
CREATININE: 1.01 mg/dL (ref 0.70–1.25)
Calcium: 8.9 mg/dL (ref 8.6–10.3)
Glucose, Bld: 79 mg/dL (ref 65–99)
Potassium: 3.8 mmol/L (ref 3.5–5.3)
SODIUM: 139 mmol/L (ref 135–146)
TOTAL PROTEIN: 6.2 g/dL (ref 6.1–8.1)

## 2017-05-19 LAB — LIPID PANEL
CHOLESTEROL: 200 mg/dL — AB (ref <200)
HDL: 64 mg/dL (ref >40)
LDL CALC: 108 mg/dL — AB (ref <100)
Total CHOL/HDL Ratio: 3.1 Ratio (ref <5.0)
Triglycerides: 140 mg/dL (ref <150)
VLDL: 28 mg/dL (ref <30)

## 2017-05-19 LAB — CBC
HCT: 40.7 % (ref 38.5–50.0)
Hemoglobin: 13.4 g/dL (ref 13.2–17.1)
MCH: 31.8 pg (ref 27.0–33.0)
MCHC: 32.9 g/dL (ref 32.0–36.0)
MCV: 96.4 fL (ref 80.0–100.0)
MPV: 8.9 fL (ref 7.5–12.5)
PLATELETS: 221 10*3/uL (ref 140–400)
RBC: 4.22 MIL/uL (ref 4.20–5.80)
RDW: 14.8 % (ref 11.0–15.0)
WBC: 6.1 10*3/uL (ref 3.8–10.8)

## 2017-05-19 NOTE — Telephone Encounter (Signed)
Yes this has been added and patient notified. Craig Morales

## 2017-05-20 LAB — TESTOSTERONE: TESTOSTERONE: 481 ng/dL (ref 250–827)

## 2017-05-20 LAB — RPR

## 2017-05-20 LAB — T-HELPER CELL (CD4) - (RCID CLINIC ONLY)
CD4 T CELL HELPER: 37 % (ref 33–55)
CD4 T Cell Abs: 750 /uL (ref 400–2700)

## 2017-05-23 LAB — HIV-1 RNA QUANT-NO REFLEX-BLD
HIV 1 RNA QUANT: NOT DETECTED {copies}/mL
HIV-1 RNA QUANT, LOG: NOT DETECTED {Log_copies}/mL

## 2017-06-02 ENCOUNTER — Encounter: Payer: Self-pay | Admitting: Internal Medicine

## 2017-06-02 ENCOUNTER — Ambulatory Visit (INDEPENDENT_AMBULATORY_CARE_PROVIDER_SITE_OTHER): Payer: Managed Care, Other (non HMO) | Admitting: Internal Medicine

## 2017-06-02 DIAGNOSIS — B20 Human immunodeficiency virus [HIV] disease: Secondary | ICD-10-CM | POA: Diagnosis not present

## 2017-06-02 MED ORDER — VALACYCLOVIR HCL 500 MG PO TABS: 500.0000 mg | ORAL_TABLET | Freq: Two times a day (BID) | ORAL | 11 refills | Status: DC

## 2017-06-02 MED ORDER — BICTEGRAVIR-EMTRICITAB-TENOFOV 50-200-25 MG PO TABS: 1.0000 | ORAL_TABLET | Freq: Every day | ORAL | 11 refills | Status: DC

## 2017-06-02 NOTE — Progress Notes (Signed)
Patient Active Problem List   Diagnosis Date Noted  . Human immunodeficiency virus (HIV) disease (HCC) 02/12/2007    Priority: High  . Rheumatoid arthritis (HCC) 04/14/2016    Priority: Medium  . History of calcium pyrophosphate deposition disease (CPPD) 07/25/2013    Priority: Medium  . Visit for preventive health examination 10/07/2013  . Gout 10/07/2013  . Degenerative arthritis of spine 11/27/2011  . EPISTAXIS, RECURRENT 11/26/2010  . IRRITABLE BOWEL SYNDROME 05/23/2008  . HERPES LABIALIS 02/23/2007  . GERD 02/23/2007  . HERPES ZOSTER, UNCOMPLICATED 02/12/2007  . DEVIATED NASAL SEPTUM 02/12/2007  . ALLERGIC RHINITIS 02/12/2007  . PSORIASIS 02/12/2007  . DIARRHEA 02/12/2007  . KNEE INJURY 02/12/2007    Patient's Medications  New Prescriptions   BICTEGRAVIR-EMTRICITABINE-TENOFOVIR AF (BIKTARVY) 50-200-25 MG TABS TABLET    Take 1 tablet by mouth daily.  Previous Medications   ASCORBIC ACID (VITAMIN C WITH ROSE HIPS) 500 MG TABLET    Take 500 mg by mouth daily.   AZELASTINE HCL 0.15 % SOLN    Place 2 sprays into the nose daily.   DOCOSAHEXAENOIC ACID PO    Take 1 tablet by mouth daily.   ETANERCEPT (ENBREL) 50 MG/ML INJECTION    Inject 0.98 mLs (50 mg total) into the skin once a week.   FLUTICASONE (FLONASE) 50 MCG/ACT NASAL SPRAY    USE TWO SPRAY(S) IN EACH NOSTRIL ONCE DAILY   FOLIC ACID (FOLVITE) 1 MG TABLET    Take 1 mg by mouth daily.   KRILL OIL OMEGA-3 PO    Take 1 each by mouth daily.   MELATONIN 5 MG CAPS    Take 1 each by mouth at bedtime.   METHOTREXATE 2.5 MG TABLET    Take 20 mg by mouth once a week.   MISC NATURAL PRODUCTS (OSTEO BI-FLEX ADV JOINT SHIELD) TABS    Take 1 each by mouth daily.   MULTIPLE VITAMINS-MINERALS (CENTRUM SILVER ADULT 50+ PO)    Take 1 each by mouth daily.   OVER THE COUNTER MEDICATION    Systane Balance Opht-Apply 1 drop to each eye daily.   OVER THE COUNTER MEDICATION    Zaditor Eye Drops-Apply 1 drop to each eye daily.   PANTOPRAZOLE (PROTONIX) 40 MG TABLET    Take 40 mg by mouth daily.   PREGABALIN (LYRICA) 75 MG CAPSULE    Take 75 mg by mouth daily. Reported on 05/29/2016   VITAMIN E 400 UNIT CAPSULE    Take 400 Units by mouth daily.  Modified Medications   Modified Medication Previous Medication   VALACYCLOVIR (VALTREX) 500 MG TABLET valACYclovir (VALTREX) 500 MG tablet      Take 1 tablet (500 mg total) by mouth 2 (two) times daily. Take 1 tablet (500 mg total) by mouth 2 (two) times daily as needed.    Take 1 tablet (500 mg total) by mouth 2 (two) times daily. Take 1 tablet (500 mg total) by mouth 2 (two) times daily as needed.  Discontinued Medications   ELVITEGRAVIR-COBICISTAT-EMTRICITABINE-TENOFOVIR (GENVOYA) 150-150-200-10 MG TABS TABLET    Take 1 tablet by mouth daily with breakfast.    Subjective: Craig Morales is in for his routine HIV follow-up visit. He has had no problems obtaining, taking or tolerating his Genvoya. He does not miss any doses. Last year he was under a great deal of stress at work and also with his mother's failing health. She died of cancer in 2023-09-13. He was also having  a lot of neck pain that seemed to have been exacerbated by ablation treatments. He developed shingles. He is now getting intermittent steroid injections in his neck and is feeling much better. He is now using his Flonase every morning for his seasonal allergies.  Review of Systems: Review of Systems  Constitutional: Positive for malaise/fatigue. Negative for chills, diaphoresis, fever and weight loss.  HENT: Negative for sore throat.   Respiratory: Negative for cough, sputum production and shortness of breath.   Cardiovascular: Negative for chest pain.  Gastrointestinal: Negative for abdominal pain, diarrhea, heartburn, nausea and vomiting.  Genitourinary: Negative for dysuria and frequency.  Musculoskeletal: Positive for joint pain. Negative for myalgias.  Skin: Negative for rash.  Neurological: Negative for dizziness  and headaches.  Psychiatric/Behavioral: Negative for depression and substance abuse. The patient is not nervous/anxious.     Past Medical History:  Diagnosis Date  . GERD (gastroesophageal reflux disease)   . HIV (human immunodeficiency virus infection) (HCC)   . Seasonal allergies     Social History  Substance Use Topics  . Smoking status: Never Smoker  . Smokeless tobacco: Never Used  . Alcohol use 4.2 oz/week    7 Standard drinks or equivalent per week     Comment: occasional    Family History  Problem Relation Age of Onset  . Bleeding Disorder Neg Hx     Allergies  Allergen Reactions  . Oxycontin [Oxycodone Hcl] Shortness Of Breath and Palpitations  . Hydrocodone     REACTION: SOB and chest pain  . Iodine Diarrhea and Nausea And Vomiting    In high doses.    Objective:  Vitals:   06/02/17 1119  BP: 121/81  Pulse: (!) 58  Temp: 97.9 F (36.6 C)  TempSrc: Oral  Weight: 158 lb (71.7 kg)  Height: 5\' 11"  (1.803 m)   Body mass index is 22.04 kg/m.  Physical Exam  Constitutional: He is oriented to person, place, and time.  He is in good spirits as usual. He has lipodystrophy with decreased peripheral fat stores and central adiposity.  HENT:  Mouth/Throat: No oropharyngeal exudate.  Eyes: Conjunctivae are normal.  Cardiovascular: Normal rate and regular rhythm.   No murmur heard. Pulmonary/Chest: Effort normal and breath sounds normal.  Abdominal: Soft. He exhibits no mass. There is no tenderness.  Musculoskeletal: Normal range of motion.  Neurological: He is alert and oriented to person, place, and time.  Skin: No rash noted.  Scattered ecchymoses on his arms.  Psychiatric: Mood and affect normal.    Lab Results Lab Results  Component Value Date   WBC 6.1 05/19/2017   HGB 13.4 05/19/2017   HCT 40.7 05/19/2017   MCV 96.4 05/19/2017   PLT 221 05/19/2017    Lab Results  Component Value Date   CREATININE 1.01 05/19/2017   BUN 14 05/19/2017   NA  139 05/19/2017   K 3.8 05/19/2017   CL 102 05/19/2017   CO2 32 (H) 05/19/2017    Lab Results  Component Value Date   ALT 17 05/19/2017   AST 19 05/19/2017   ALKPHOS 43 05/19/2017   BILITOT 0.6 05/19/2017    Lab Results  Component Value Date   CHOL 200 (H) 05/19/2017   HDL 64 05/19/2017   LDLCALC 108 (H) 05/19/2017   TRIG 140 05/19/2017   CHOLHDL 3.1 05/19/2017   Lab Results  Component Value Date   LABRPR NON REAC 05/19/2017   HIV 1 RNA Quant (copies/mL)  Date Value  05/19/2017 <  20 NOT DETECTED  05/15/2016 <20  11/15/2015 <20   CD4 T Cell Abs (/uL)  Date Value  05/19/2017 750  05/15/2016 710  11/15/2015 630     Problem List Items Addressed This Visit      High   Human immunodeficiency virus (HIV) disease (HCC)    His infection is under excellent, long-term control. Due to the interaction between the cobiscistat component of Genvoya and Flonase that can increase Flonase levels I will switch Genvoya to [employer]. He will follow-up after blood work in 6 months.      Relevant Medications   bictegravir-emtricitabine-tenofovir AF (BIKTARVY) 50-200-25 MG TABS tablet   valACYclovir (VALTREX) 500 MG tablet   Other Relevant Orders   T-helper cell (CD4)- ([facility] clinic only)   HIV 1 RNA quant-no reflex-bld   CBC   Comprehensive metabolic panel   Lipid panel   RPR        Craig Asters, MD [facility] [phone] pager   [phone] cell 06/02/2017, 11:56 AM

## 2017-06-02 NOTE — Assessment & Plan Note (Signed)
His infection is under excellent, long-term control. Due to the interaction between the cobiscistat component of Genvoya and Flonase that can increase Flonase levels I will switch Genvoya to [employer]. He will follow-up after blood work in 6 months.

## 2017-08-17 ENCOUNTER — Encounter: Payer: Self-pay | Admitting: Internal Medicine

## 2017-08-17 ENCOUNTER — Other Ambulatory Visit: Payer: Self-pay | Admitting: *Deleted

## 2017-08-17 DIAGNOSIS — B20 Human immunodeficiency virus [HIV] disease: Secondary | ICD-10-CM

## 2017-08-17 MED ORDER — BICTEGRAVIR-EMTRICITAB-TENOFOV 50-200-25 MG PO TABS: 1.0000 | ORAL_TABLET | Freq: Every day | ORAL | 11 refills | Status: DC

## 2017-11-12 ENCOUNTER — Other Ambulatory Visit: Payer: Managed Care, Other (non HMO)

## 2017-11-12 DIAGNOSIS — R5383 Other fatigue: Secondary | ICD-10-CM

## 2017-11-12 DIAGNOSIS — R634 Abnormal weight loss: Secondary | ICD-10-CM

## 2017-11-12 DIAGNOSIS — B20 Human immunodeficiency virus [HIV] disease: Secondary | ICD-10-CM

## 2017-11-13 LAB — COMPREHENSIVE METABOLIC PANEL
AG Ratio: 1.5 (calc) (ref 1.0–2.5)
ALKALINE PHOSPHATASE (APISO): 66 U/L (ref 40–115)
ALT: 31 U/L (ref 9–46)
AST: 29 U/L (ref 10–35)
Albumin: 4.1 g/dL (ref 3.6–5.1)
BUN: 14 mg/dL (ref 7–25)
CO2: 30 mmol/L (ref 20–32)
CREATININE: 0.84 mg/dL (ref 0.70–1.25)
Calcium: 9.5 mg/dL (ref 8.6–10.3)
Chloride: 103 mmol/L (ref 98–110)
GLUCOSE: 83 mg/dL (ref 65–99)
Globulin: 2.7 g/dL (calc) (ref 1.9–3.7)
Potassium: 3.9 mmol/L (ref 3.5–5.3)
Sodium: 140 mmol/L (ref 135–146)
Total Bilirubin: 0.5 mg/dL (ref 0.2–1.2)
Total Protein: 6.8 g/dL (ref 6.1–8.1)

## 2017-11-13 LAB — CBC
HCT: 40.6 % (ref 38.5–50.0)
Hemoglobin: 14 g/dL (ref 13.2–17.1)
MCH: 32.1 pg (ref 27.0–33.0)
MCHC: 34.5 g/dL (ref 32.0–36.0)
MCV: 93.1 fL (ref 80.0–100.0)
MPV: 10 fL (ref 7.5–12.5)
PLATELETS: 248 10*3/uL (ref 140–400)
RBC: 4.36 10*6/uL (ref 4.20–5.80)
RDW: 13.8 % (ref 11.0–15.0)
WBC: 5.6 10*3/uL (ref 3.8–10.8)

## 2017-11-13 LAB — LIPID PANEL
CHOL/HDL RATIO: 2.8 (calc) (ref <5.0)
CHOLESTEROL: 163 mg/dL (ref <200)
HDL: 59 mg/dL (ref >40)
LDL Cholesterol (Calc): 78 mg/dL (calc)
NON-HDL CHOLESTEROL (CALC): 104 mg/dL (ref <130)
TRIGLYCERIDES: 157 mg/dL — AB (ref <150)

## 2017-11-13 LAB — T-HELPER CELL (CD4) - (RCID CLINIC ONLY)
CD4 T CELL ABS: 780 /uL (ref 400–2700)
CD4 T CELL HELPER: 37 % (ref 33–55)

## 2017-11-13 LAB — TESTOSTERONE: TESTOSTERONE: 505 ng/dL (ref 250–827)

## 2017-11-13 LAB — RPR: RPR: NONREACTIVE

## 2017-11-13 NOTE — Addendum Note (Signed)
Addended by: Mariea Clonts D on: 11/13/2017 10:09 AM   Modules accepted: Orders

## 2017-11-16 LAB — HIV-1 RNA QUANT-NO REFLEX-BLD
HIV 1 RNA QUANT: NOT DETECTED {copies}/mL
HIV-1 RNA QUANT, LOG: NOT DETECTED {Log_copies}/mL

## 2017-11-26 ENCOUNTER — Ambulatory Visit (INDEPENDENT_AMBULATORY_CARE_PROVIDER_SITE_OTHER): Payer: Managed Care, Other (non HMO) | Admitting: Internal Medicine

## 2017-11-26 ENCOUNTER — Encounter: Payer: Self-pay | Admitting: Internal Medicine

## 2017-11-26 DIAGNOSIS — B20 Human immunodeficiency virus [HIV] disease: Secondary | ICD-10-CM

## 2017-11-26 NOTE — Assessment & Plan Note (Signed)
His infection is under excellent, long-term control.  He will continue Biktarvy and follow-up after lab work in 1 year. 

## 2017-11-26 NOTE — Progress Notes (Signed)
Patient Active Problem List   Diagnosis Date Noted  . Human immunodeficiency virus (HIV) disease (HCC) 02/12/2007    Priority: High  . Rheumatoid arthritis (HCC) 04/14/2016    Priority: Medium  . History of calcium pyrophosphate deposition disease (CPPD) 07/25/2013    Priority: Medium  . Visit for preventive health examination 10/07/2013  . Gout 10/07/2013  . Degenerative arthritis of spine 11/27/2011  . EPISTAXIS, RECURRENT 11/26/2010  . IRRITABLE BOWEL SYNDROME 05/23/2008  . HERPES LABIALIS 02/23/2007  . GERD 02/23/2007  . HERPES ZOSTER, UNCOMPLICATED 02/12/2007  . DEVIATED NASAL SEPTUM 02/12/2007  . ALLERGIC RHINITIS 02/12/2007  . PSORIASIS 02/12/2007  . DIARRHEA 02/12/2007  . KNEE INJURY 02/12/2007      Medication List        Accurate as of 11/26/17  4:46 PM. Always use your most recent med list.          Azelastine HCl 0.15 % Soln   bictegravir-emtricitabine-tenofovir AF 50-200-25 MG Tabs tablet Commonly known as:  BIKTARVY Take 1 tablet by mouth daily.   CENTRUM SILVER ADULT 50+ PO   DOCOSAHEXAENOIC ACID PO   etanercept 50 MG/ML injection Commonly known as:  ENBREL Inject 0.98 mLs (50 mg total) into the skin once a week.   fluticasone 50 MCG/ACT nasal spray Commonly known as:  FLONASE USE TWO SPRAY(S) IN EACH NOSTRIL ONCE DAILY   folic acid 1 MG tablet Commonly known as:  FOLVITE   KRILL OIL OMEGA-3 PO   Melatonin 5 MG Caps   methotrexate 2.5 MG tablet   OSTEO BI-FLEX ADV JOINT SHIELD Tabs   OVER THE COUNTER MEDICATION   OVER THE COUNTER MEDICATION   pantoprazole 40 MG tablet Commonly known as:  PROTONIX   pregabalin 75 MG capsule Commonly known as:  LYRICA   valACYclovir 500 MG tablet Commonly known as:  VALTREX Take 1 tablet (500 mg total) by mouth 2 (two) times daily. Take 1 tablet (500 mg total) by mouth 2 (two) times daily as needed.   vitamin C with rose hips 500 MG tablet   vitamin E 400 UNIT capsule        Subjective: Craig Morales is in for his routine HIV follow-up visit.  He has had no problems obtaining, taking or tolerating his Biktarvy.  He says that he has less issues with diarrhea than he did when taking Genvoya.   Review of Systems: Review of Systems  Constitutional: Negative for chills, diaphoresis, fever, malaise/fatigue and weight loss.  HENT: Negative for sore throat.   Respiratory: Negative for cough, sputum production and shortness of breath.   Cardiovascular: Negative for chest pain.  Gastrointestinal: Negative for abdominal pain, diarrhea, heartburn, nausea and vomiting.  Genitourinary: Negative for dysuria and frequency.  Musculoskeletal: Positive for joint pain and neck pain. Negative for myalgias.  Skin: Negative for rash.  Neurological: Negative for dizziness and headaches.  Psychiatric/Behavioral: Negative for depression and substance abuse. The patient is not nervous/anxious.     Past Medical History:  Diagnosis Date  . GERD (gastroesophageal reflux disease)   . HIV (human immunodeficiency virus infection) (HCC)   . Seasonal allergies     Social History   Tobacco Use  . Smoking status: Never Smoker  . Smokeless tobacco: Never Used  Substance Use Topics  . Alcohol use: Yes    Alcohol/week: 4.2 oz    Types: 7 Standard drinks or equivalent per week    Comment: occasional  . Drug use: No  Family History  Problem Relation Age of Onset  . Bleeding Disorder Neg Hx     Allergies  Allergen Reactions  . Oxycontin [Oxycodone Hcl] Shortness Of Breath and Palpitations  . Hydrocodone     REACTION: SOB and chest pain  . Iodine Diarrhea and Nausea And Vomiting    In high doses.    Health Maintenance  Topic Date Due  . TETANUS/TDAP  10/08/2023  . COLONOSCOPY  04/07/2027  . INFLUENZA VACCINE  Completed  . Hepatitis C Screening  Completed  . HIV Screening  Completed    Objective:  Vitals:   11/26/17 1612  BP: 129/78  Pulse: 62  Temp: 97.9 F (36.6  C)  TempSrc: Oral  Weight: 157 lb (71.2 kg)  Height: 5\' 11"  (1.803 m)   Body mass index is 21.9 kg/m.  Physical Exam  Constitutional: He is oriented to person, place, and time.  He is talkative and in good spirits as usual.  HENT:  Mouth/Throat: No oropharyngeal exudate.  Eyes: Conjunctivae are normal.  Cardiovascular: Normal rate and regular rhythm.  No murmur heard. Pulmonary/Chest: Effort normal and breath sounds normal.  Abdominal: Soft. He exhibits no mass. There is no tenderness.  Musculoskeletal: Normal range of motion.  Neurological: He is alert and oriented to person, place, and time.  Skin: No rash noted.  His left index finger nail is dystrophic.  Psychiatric: Mood and affect normal.    Lab Results Lab Results  Component Value Date   WBC 5.6 11/12/2017   HGB 14.0 11/12/2017   HCT 40.6 11/12/2017   MCV 93.1 11/12/2017   PLT 248 11/12/2017    Lab Results  Component Value Date   CREATININE 0.84 11/12/2017   BUN 14 11/12/2017   NA 140 11/12/2017   K 3.9 11/12/2017   CL 103 11/12/2017   CO2 30 11/12/2017    Lab Results  Component Value Date   ALT 31 11/12/2017   AST 29 11/12/2017   ALKPHOS 43 05/19/2017   BILITOT 0.5 11/12/2017    Lab Results  Component Value Date   CHOL 163 11/12/2017   HDL 59 11/12/2017   LDLCALC 108 (H) 05/19/2017   TRIG 157 (H) 11/12/2017   CHOLHDL 2.8 11/12/2017   Lab Results  Component Value Date   LABRPR NON-REACTIVE 11/12/2017   HIV 1 RNA Quant (copies/mL)  Date Value  11/12/2017 <20 NOT DETECTED  05/19/2017 <20 NOT DETECTED  05/15/2016 <20   CD4 T Cell Abs (/uL)  Date Value  11/12/2017 780  05/19/2017 750  05/15/2016 710     Problem List Items Addressed This Visit      High   Human immunodeficiency virus (HIV) disease (HCC)    His infection is under excellent, long-term control.  He will continue Biktarvy and follow-up after lab work in 1 year.      Relevant Orders   CBC   T-helper cell (CD4)- ([facility]  clinic only)   Comprehensive metabolic panel   Lipid panel   RPR   HIV 1 RNA quant-no reflex-bld        07/15/2016, MD [facility] [phone] pager   [phone] cell 11/26/2017, 4:46 PM

## 2018-03-31 ENCOUNTER — Encounter: Payer: Self-pay | Admitting: Medical

## 2018-03-31 ENCOUNTER — Ambulatory Visit: Payer: Managed Care, Other (non HMO) | Admitting: Medical

## 2018-03-31 VITALS — BP 112/67 | HR 70 | Temp 98.2°F | Resp 16 | Ht 71.0 in | Wt 158.2 lb

## 2018-03-31 DIAGNOSIS — J301 Allergic rhinitis due to pollen: Secondary | ICD-10-CM

## 2018-03-31 MED ORDER — METHYLPREDNISOLONE ACETATE 40 MG/ML IJ SUSP
40.0000 mg | Freq: Once | INTRAMUSCULAR | Status: AC
  Administered 2018-03-31: 40 mg via INTRAMUSCULAR

## 2018-03-31 MED ORDER — AMOXICILLIN-POT CLAVULANATE 875-125 MG PO TABS: 1.0000 | ORAL_TABLET | Freq: Two times a day (BID) | ORAL | 0 refills | Status: DC

## 2018-03-31 NOTE — Progress Notes (Signed)
Subjective:    Patient ID: Craig Morales, male    DOB: 04-Nov-1956, 62 y.o.   MRN: [id number]  HPI  Pt in with some mild cough and pnd. Pt had symptoms since Sunday. Has mild st. The pnd was severe but can still feel some. Pt had some sneezing and itchy eyes.   Pt does use Astelin, flonase and zyrtec just recently.   No colored mucus when blows nose.   Above symptoms since Sunday.  Pt states second hand smoke will irritate above symptoms.    Review of Systems  Constitutional: Negative for fatigue and fever.  HENT: Positive for congestion.   Respiratory: Positive for cough and wheezing. Negative for chest tightness and shortness of breath.        Some mild wheezing. Transient. Not reporting any sustained wheezing with each breath.  Cardiovascular: Negative for chest pain and palpitations.  Gastrointestinal: Negative for abdominal pain.  Genitourinary: Negative for dysuria, flank pain and frequency.  Musculoskeletal: Negative for back pain and joint swelling.  Skin: Negative for rash.  Neurological: Negative for dizziness, speech difficulty, weakness, light-headedness and numbness.  Hematological: Negative for adenopathy. Does not bruise/bleed easily.  Psychiatric/Behavioral: Negative for behavioral problems, confusion, dysphoric mood and suicidal ideas. The patient is not nervous/anxious.     Past Medical History:  Diagnosis Date  . GERD (gastroesophageal reflux disease)   . HIV (human immunodeficiency virus infection) (HCC)   . Seasonal allergies      Social History   Socioeconomic History  . Marital status: Married    Spouse name: Not on file  . Number of children: Not on file  . Years of education: Not on file  . Highest education level: Not on file  Occupational History  . Not on file  Social Needs  . Financial resource strain: Not on file  . Food insecurity:    Worry: Not on file    Inability: Not on file  . Transportation needs:    Medical: Not on file    Non-medical: Not on file  Tobacco Use  . Smoking status: Never Smoker  . Smokeless tobacco: Never Used  Substance and Sexual Activity  . Alcohol use: Yes    Alcohol/week: 4.2 oz    Types: 7 Standard drinks or equivalent per week    Comment: occasional  . Drug use: No  . Sexual activity: Not Currently    Birth control/protection: None    Comment: given condoms  Lifestyle  . Physical activity:    Days per week: Not on file    Minutes per session: Not on file  . Stress: Not on file  Relationships  . Social connections:    Talks on phone: Not on file    Gets together: Not on file    Attends religious service: Not on file    Active member of club or organization: Not on file    Attends meetings of clubs or organizations: Not on file    Relationship status: Not on file  . Intimate partner violence:    Fear of current or ex partner: Not on file    Emotionally abused: Not on file    Physically abused: Not on file    Forced sexual activity: Not on file  Other Topics Concern  . Not on file  Social History Narrative  . Not on file    Past Surgical History:  Procedure Laterality Date  . KNEE SURGERY    . NASAL SEPTUM SURGERY  Family History  Problem Relation Age of Onset  . Bleeding Disorder Neg Hx     Allergies  Allergen Reactions  . Oxycontin [Oxycodone Hcl] Shortness Of Breath and Palpitations  . Hydrocodone     REACTION: SOB and chest pain  . Iodine Diarrhea and Nausea And Vomiting    In high doses.    Current Outpatient Medications on File Prior to Visit  Medication Sig Dispense Refill  . Ascorbic Acid (VITAMIN C WITH ROSE HIPS) 500 MG tablet Take 500 mg by mouth daily.    . Azelastine HCl 0.15 % SOLN Place 2 sprays into the nose daily.    . bictegravir-emtricitabine-tenofovir AF (BIKTARVY) 50-200-25 MG TABS tablet Take 1 tablet by mouth daily. 30 tablet 11  . DOCOSAHEXAENOIC ACID PO Take 1 tablet by mouth daily.    Marland Kitchen etanercept (ENBREL) 50 MG/ML injection  Inject 0.98 mLs (50 mg total) into the skin once a week. 0.98 mL   . fluticasone (FLONASE) 50 MCG/ACT nasal spray USE TWO SPRAY(S) IN EACH NOSTRIL ONCE DAILY 16 g 6  . folic acid (FOLVITE) 1 MG tablet Take 1 mg by mouth daily.    Marland Kitchen KRILL OIL OMEGA-3 PO Take 1 each by mouth daily.    . Melatonin 5 MG CAPS Take 1 each by mouth at bedtime.    . methotrexate 2.5 MG tablet Take 20 mg by mouth once a week.    . Misc Natural Products (OSTEO BI-FLEX ADV JOINT SHIELD) TABS Take 1 each by mouth daily.    . Multiple Vitamins-Minerals (CENTRUM SILVER ADULT 50+ PO) Take 1 each by mouth daily.    Marland Kitchen OVER THE COUNTER MEDICATION Systane Balance Opht-Apply 1 drop to each eye daily.    Marland Kitchen OVER THE COUNTER MEDICATION Zaditor Eye Drops-Apply 1 drop to each eye daily.    . pantoprazole (PROTONIX) 40 MG tablet Take 40 mg by mouth daily.    . pregabalin (LYRICA) 75 MG capsule Take 75 mg by mouth daily. Reported on 05/29/2016    . valACYclovir (VALTREX) 500 MG tablet Take 1 tablet (500 mg total) by mouth 2 (two) times daily. Take 1 tablet (500 mg total) by mouth 2 (two) times daily as needed. 30 tablet 11  . vitamin E 400 UNIT capsule Take 400 Units by mouth daily.     No current facility-administered medications on file prior to visit.     BP 112/67   Pulse 70   Temp 98.2 F (36.8 C) (Oral)   Resp 16   Ht 5\' 11"  (1.803 m)   Wt 158 lb 3.2 oz (71.8 kg)   SpO2 98%   BMI 22.06 kg/m       Objective:   Physical Exam  General  Mental Status - Alert. General Appearance - Well groomed. Not in acute distress.  Skin Rashes- No Rashes.  HEENT Head- Normal. Ear Auditory Canal - Left- Normal. Right - Normal.Tympanic Membrane- Left- Normal. Right- Normal. Eye Sclera/Conjunctiva- Left- Normal. Right- Normal. Nose & Sinuses Nasal Mucosa- Left-  Boggy and Congested. Right-  Boggy and  Congested.Bilateral mild/faint maxillary and mild/faint  frontal sinus pressure. Mouth & Throat Lips: Upper Lip- Normal: no  dryness, cracking, pallor, cyanosis, or vesicular eruption. Lower Lip-Normal: no dryness, cracking, pallor, cyanosis or vesicular eruption. Buccal Mucosa- Bilateral- No Aphthous ulcers. Oropharynx- No Discharge or Erythema. Tonsils: Characteristics- Bilateral- No Erythema or Congestion. Size/Enlargement- Bilateral- No enlargement. Discharge- bilateral-None.  Neck Neck- Supple. No Masses.   Chest and Lung Exam Auscultation: Breath  Sounds:-Clear even and unlabored.  Cardiovascular Auscultation:Rythm- Regular, rate and rhythm. Murmurs & Other Heart Sounds:Ausculatation of the heart reveal- No Murmurs.  Lymphatic Head & Neck General Head & Neck Lymphatics: Bilateral: Description- No Localized lymphadenopathy.       Assessment & Plan:  You do appear to have recent allergic rhinitis symptoms.  Some mild sinus pressure presently but I do not think this represents infection today.  Would advise you  to continue the Astelin, Flonase and Zyrtec.  Since you do have recent symptoms despite these medications, we gave you Depo-Medrol 40 mg IM injection.  This should help clear your symptoms.  But if you have recurrent allergy symptoms in the near future despite the steroid injection then would recommend adding on Singulair.  If you get worse sinus pain despite the above treatment then also making print prescription of Augmentin antibiotic available.  Follow-up in 7 to 10 days or as needed.  Esperanza Richters, PA-C

## 2018-03-31 NOTE — Patient Instructions (Addendum)
You do appear to have recent allergic rhinitis symptoms.  Some mild sinus pressure presently but I do not think this represents infection today.  Would advise you  to continue the Astelin, Flonase and Zyrtec.  Since you do have recent symptoms despite these medications, we gave you Depo-Medrol 40 mg IM injection.  This should help clear your symptoms.  But if you have recurrent allergy symptoms in the near future despite the steroid injection then would recommend adding on Singulair.  If you get worse sinus pain despite the above treatment then also making print prescription of Augmentin antibiotic available.  Follow-up in 7 to 10 days or as needed.

## 2018-04-23 ENCOUNTER — Telehealth: Payer: Self-pay | Admitting: *Deleted

## 2018-04-23 NOTE — Telephone Encounter (Signed)
Received Lab Report results from [facility]; forwarded to provider/SLS 05/17

## 2018-06-01 ENCOUNTER — Encounter: Payer: Self-pay | Admitting: Internal Medicine

## 2018-06-02 ENCOUNTER — Other Ambulatory Visit: Payer: Self-pay | Admitting: *Deleted

## 2018-06-02 DIAGNOSIS — B009 Herpesviral infection, unspecified: Secondary | ICD-10-CM

## 2018-06-02 DIAGNOSIS — B20 Human immunodeficiency virus [HIV] disease: Secondary | ICD-10-CM

## 2018-06-02 MED ORDER — BICTEGRAVIR-EMTRICITAB-TENOFOV 50-200-25 MG PO TABS: 1.0000 | ORAL_TABLET | Freq: Every day | ORAL | 2 refills | Status: DC

## 2018-06-02 MED ORDER — VALACYCLOVIR HCL 500 MG PO TABS: 500.0000 mg | ORAL_TABLET | Freq: Two times a day (BID) | ORAL | 2 refills | Status: DC

## 2018-06-24 ENCOUNTER — Ambulatory Visit (INDEPENDENT_AMBULATORY_CARE_PROVIDER_SITE_OTHER): Payer: Managed Care, Other (non HMO) | Admitting: Family Medicine

## 2018-06-24 ENCOUNTER — Encounter: Payer: Self-pay | Admitting: Family Medicine

## 2018-06-24 VITALS — BP 108/64 | HR 69 | Temp 98.4°F | Ht 71.0 in | Wt 158.2 lb

## 2018-06-24 DIAGNOSIS — Z125 Encounter for screening for malignant neoplasm of prostate: Secondary | ICD-10-CM | POA: Diagnosis not present

## 2018-06-24 DIAGNOSIS — M069 Rheumatoid arthritis, unspecified: Secondary | ICD-10-CM

## 2018-06-24 DIAGNOSIS — Z0000 Encounter for general adult medical examination without abnormal findings: Secondary | ICD-10-CM

## 2018-06-24 LAB — PSA: PSA: 1.17 ng/mL (ref 0.10–4.00)

## 2018-06-24 NOTE — Progress Notes (Signed)
Pre visit review using our clinic review tool, if applicable. No additional management support is needed unless otherwise documented below in the visit note. 

## 2018-06-24 NOTE — Progress Notes (Signed)
Chief Complaint  Patient presents with  . Annual Exam    Well Male Craig Morales is here for a complete physical.   His last physical was >1 year ago.  Current diet: in general, a "healthy" diet.  Current exercise: Goes to gym- cardio, lifting Weight trend: stable Seat belt? Yes.    [facility]- Dr. Janee Morn  Health maintenance Colonoscopy- Yes Tetanus- Yes HIV- Pt treated for HIV thru ID clinic Hep C- Yes Prostate cancer screening- No   Past Medical History:  Diagnosis Date  . GERD (gastroesophageal reflux disease)   . HIV (human immunodeficiency virus infection) (HCC)   . Seasonal allergies       Past Surgical History:  Procedure Laterality Date  . KNEE SURGERY    . NASAL SEPTUM SURGERY      Medications  Current Outpatient Medications on File Prior to Visit  Medication Sig Dispense Refill  . Ascorbic Acid (VITAMIN C WITH ROSE HIPS) 500 MG tablet Take 500 mg by mouth daily.    . Azelastine HCl 0.15 % SOLN Place 2 sprays into the nose daily.    . bictegravir-emtricitabine-tenofovir AF (BIKTARVY) 50-200-25 MG TABS tablet Take 1 tablet by mouth daily. 90 tablet 2  . DOCOSAHEXAENOIC ACID PO Take 1 tablet by mouth daily.    Marland Kitchen etanercept (ENBREL) 50 MG/ML injection Inject 0.98 mLs (50 mg total) into the skin once a week. 0.98 mL   . fluticasone (FLONASE) 50 MCG/ACT nasal spray USE TWO SPRAY(S) IN EACH NOSTRIL ONCE DAILY 16 g 6  . folic acid (FOLVITE) 1 MG tablet Take 1 mg by mouth daily.    Marland Kitchen KRILL OIL OMEGA-3 PO Take 1 each by mouth daily.    . Melatonin 5 MG CAPS Take 1 each by mouth at bedtime.    . methotrexate 2.5 MG tablet Take 20 mg by mouth once a week.    . Misc Natural Products (OSTEO BI-FLEX ADV JOINT SHIELD) TABS Take 1 each by mouth daily.    . Multiple Vitamins-Minerals (CENTRUM SILVER ADULT 50+ PO) Take 1 each by mouth daily.    Marland Kitchen OVER THE COUNTER MEDICATION Systane Balance Opht-Apply 1 drop to each eye daily.    Marland Kitchen OVER THE COUNTER MEDICATION Zaditor  Eye Drops-Apply 1 drop to each eye daily.    . pantoprazole (PROTONIX) 40 MG tablet Take 40 mg by mouth daily.    . pregabalin (LYRICA) 75 MG capsule Take 75 mg by mouth daily. Reported on 05/29/2016    . valACYclovir (VALTREX) 500 MG tablet Take 1 tablet (500 mg total) by mouth 2 (two) times daily. Take 1 tablet (500 mg total) by mouth 2 (two) times daily as needed. 90 tablet 2  . vitamin E 400 UNIT capsule Take 400 Units by mouth daily.     Allergies Allergies  Allergen Reactions  . Oxycontin [Oxycodone Hcl] Shortness Of Breath and Palpitations  . Hydrocodone     REACTION: SOB and chest pain  . Iodine Diarrhea and Nausea And Vomiting    In high doses.    Family History Family History  Problem Relation Age of Onset  . Bleeding Disorder Neg Hx     Review of Systems: Constitutional:  no fevers Eye:  no recent significant change in vision Ear/Nose/Mouth/Throat:  Ears:  no hearing loss Nose/Mouth/Throat:  no complaints of nasal congestion, no sore throat Cardiovascular:  no chest pain, no palpitations Respiratory:  no cough and no shortness of breath Gastrointestinal:  no abdominal pain, no  change in bowel habits GU:  Male: negative for dysuria, frequency, and incontinence and negative for prostate symptoms Musculoskeletal/Extremities: +b/l hand pain; otherwise no pain, redness, or swelling of the joints Integumentary (Skin/Breast):  no abnormal skin lesions reported Neurologic:  no headaches Endocrine: No unexpected weight changes Hematologic/Lymphatic:  no abnormal bleeding  Exam BP 108/64 (BP Location: Left Arm, Patient Position: Sitting, Cuff Size: Normal)   Pulse 69   Temp 98.4 F (36.9 C) (Oral)   Ht 5\' 11"  (1.803 m)   Wt 158 lb 4 oz (71.8 kg)   SpO2 93%   BMI 22.07 kg/m  General:  well developed, well nourished, in no apparent distress Skin:  no significant moles, warts, or growths Head:  no masses, lesions, or tenderness Eyes:  pupils equal and round, sclera  anicteric without injection Ears:  canals without lesions, TMs shiny without retraction, no obvious effusion, no erythema Nose:  nares patent, septum midline, mucosa normal Throat/Pharynx:  lips and gingiva without lesion; tongue and uvula midline; non-inflamed pharynx; no exudates or postnasal drainage Neck: neck supple without adenopathy, thyromegaly, or masses Lungs:  clear to auscultation, breath sounds equal bilaterally, no respiratory distress Cardio:  regular rate and rhythm, no LE edema, no bruits Abdomen:  abdomen soft, nontender; bowel sounds normal; no masses or organomegaly Genital (male): Uncircumcised penis, no lesions or discharge; testes present bilaterally without masses or tenderness Rectal: Deferred Musculoskeletal:  symmetrical muscle groups noted without atrophy or deformity Extremities:  no clubbing, cyanosis, or edema, no deformities, no skin discoloration Neuro:  gait normal; deep tendon reflexes normal and symmetric Psych: well oriented with normal range of affect and appropriate judgment/insight  Assessment and Plan  Well adult exam  Screening for prostate cancer - Plan: PSA  Rheumatoid arthritis involving both hands, unspecified rheumatoid factor presence (HCC) - Plan: Ambulatory referral to Rheumatology   Well 62 y.o. male. Counseled on diet and exercise. Counseled on risks and benefits of prostate cancer screening with PSA. The patient agrees to undergo testing. Immunizations, labs, and further orders as above. Pt has labs done freq thru ID, will hold off on wellness labs.  Pt requesting rheumatologist in area who is also in system. Will refer to Dr 68 upstairs.  Follow up in 1 yr or prn. The patient voiced understanding and agreement to the plan.  [name] [place], DO 06/24/18 9:05 AM

## 2018-06-24 NOTE — Patient Instructions (Addendum)
1-2 days to get the results of your lab back.   Stay active and keep the diet clean.   If you do not hear anything about your referral in the next 1-2 weeks, call our office and ask for an update.  Let us know if you need anything.

## 2018-06-30 ENCOUNTER — Telehealth: Payer: Self-pay | Admitting: Family Medicine

## 2018-06-30 NOTE — Telephone Encounter (Signed)
Copied from CRM [id number]. Topic: General - Other >> Jun 30, 2018 10:43 AM Tamela Oddi wrote: Reason for CRM: Patient is returning call from Alejandro Adcox regarding a referral to a rheumatologist.  He stated that he was talking to her but the phone cut off.  Please advise.  CB# [phone].  Called the patient back and again left message to call back.

## 2018-11-15 ENCOUNTER — Other Ambulatory Visit: Payer: Managed Care, Other (non HMO)

## 2018-11-15 DIAGNOSIS — B20 Human immunodeficiency virus [HIV] disease: Secondary | ICD-10-CM

## 2018-11-16 LAB — T-HELPER CELL (CD4) - (RCID CLINIC ONLY)
CD4 % Helper T Cell: 32 % — ABNORMAL LOW (ref 33–55)
CD4 T Cell Abs: 620 /uL (ref 400–2700)

## 2018-11-17 LAB — COMPREHENSIVE METABOLIC PANEL
AG Ratio: 1.6 (calc) (ref 1.0–2.5)
ALT: 23 U/L (ref 9–46)
AST: 27 U/L (ref 10–35)
Albumin: 4.2 g/dL (ref 3.6–5.1)
Alkaline phosphatase (APISO): 60 U/L (ref 40–115)
BUN: 17 mg/dL (ref 7–25)
CO2: 31 mmol/L (ref 20–32)
Calcium: 9.6 mg/dL (ref 8.6–10.3)
Chloride: 103 mmol/L (ref 98–110)
Creat: 0.97 mg/dL (ref 0.70–1.25)
Globulin: 2.7 g/dL (calc) (ref 1.9–3.7)
Glucose, Bld: 84 mg/dL (ref 65–99)
Potassium: 4.4 mmol/L (ref 3.5–5.3)
SODIUM: 140 mmol/L (ref 135–146)
TOTAL PROTEIN: 6.9 g/dL (ref 6.1–8.1)
Total Bilirubin: 0.6 mg/dL (ref 0.2–1.2)

## 2018-11-17 LAB — CBC
HCT: 42.4 % (ref 38.5–50.0)
Hemoglobin: 14.2 g/dL (ref 13.2–17.1)
MCH: 31.1 pg (ref 27.0–33.0)
MCHC: 33.5 g/dL (ref 32.0–36.0)
MCV: 93 fL (ref 80.0–100.0)
MPV: 10.1 fL (ref 7.5–12.5)
Platelets: 234 10*3/uL (ref 140–400)
RBC: 4.56 10*6/uL (ref 4.20–5.80)
RDW: 15.4 % — ABNORMAL HIGH (ref 11.0–15.0)
WBC: 6 10*3/uL (ref 3.8–10.8)

## 2018-11-17 LAB — HIV-1 RNA QUANT-NO REFLEX-BLD
HIV 1 RNA QUANT: NOT DETECTED {copies}/mL
HIV-1 RNA Quant, Log: <1.3 Log copies/mL

## 2018-11-17 LAB — LIPID PANEL
Cholesterol: 198 mg/dL (ref <200)
HDL: 71 mg/dL (ref >40)
LDL Cholesterol (Calc): 112 mg/dL (calc) — ABNORMAL HIGH
Non-HDL Cholesterol (Calc): 127 mg/dL (calc) (ref <130)
Total CHOL/HDL Ratio: 2.8 (calc) (ref <5.0)
Triglycerides: 60 mg/dL (ref <150)

## 2018-11-17 LAB — RPR: RPR: NONREACTIVE

## 2018-11-29 ENCOUNTER — Encounter: Payer: Self-pay | Admitting: Internal Medicine

## 2018-11-29 ENCOUNTER — Ambulatory Visit (INDEPENDENT_AMBULATORY_CARE_PROVIDER_SITE_OTHER): Payer: Managed Care, Other (non HMO) | Admitting: Internal Medicine

## 2018-11-29 VITALS — BP 115/74 | HR 59 | Temp 98.0°F | Ht 71.0 in | Wt 162.0 lb

## 2018-11-29 DIAGNOSIS — Z885 Allergy status to narcotic agent status: Secondary | ICD-10-CM

## 2018-11-29 DIAGNOSIS — Z91048 Other nonmedicinal substance allergy status: Secondary | ICD-10-CM

## 2018-11-29 DIAGNOSIS — B20 Human immunodeficiency virus [HIV] disease: Secondary | ICD-10-CM | POA: Diagnosis not present

## 2018-11-29 DIAGNOSIS — Z23 Encounter for immunization: Secondary | ICD-10-CM

## 2018-11-29 DIAGNOSIS — M069 Rheumatoid arthritis, unspecified: Secondary | ICD-10-CM | POA: Diagnosis not present

## 2018-11-29 DIAGNOSIS — Z79899 Other long term (current) drug therapy: Secondary | ICD-10-CM

## 2018-11-29 NOTE — Assessment & Plan Note (Signed)
His infection remains under excellent, long-term control.  He will continue Biktarvy and follow-up here after lab work in 1 year. 

## 2018-11-29 NOTE — Progress Notes (Signed)
Patient Active Problem List   Diagnosis Date Noted  . Human immunodeficiency virus (HIV) disease (HCC) 02/12/2007    Priority: High  . Rheumatoid arthritis (HCC) 04/14/2016    Priority: Medium  . History of calcium pyrophosphate deposition disease (CPPD) 07/25/2013    Priority: Medium  . Visit for preventive health examination 10/07/2013  . Gout 10/07/2013  . Degenerative arthritis of spine 11/27/2011  . EPISTAXIS, RECURRENT 11/26/2010  . IRRITABLE BOWEL SYNDROME 05/23/2008  . HERPES LABIALIS 02/23/2007  . GERD 02/23/2007  . HERPES ZOSTER, UNCOMPLICATED 02/12/2007  . DEVIATED NASAL SEPTUM 02/12/2007  . ALLERGIC RHINITIS 02/12/2007  . PSORIASIS 02/12/2007  . DIARRHEA 02/12/2007  . KNEE INJURY 02/12/2007    Patient's Medications  New Prescriptions   No medications on file  Previous Medications   ASCORBIC ACID (VITAMIN C WITH ROSE HIPS) 500 MG TABLET    Take 500 mg by mouth daily.   AZELASTINE HCL 0.15 % SOLN    Place 2 sprays into the nose daily.   BICTEGRAVIR-EMTRICITABINE-TENOFOVIR AF (BIKTARVY) 50-200-25 MG TABS TABLET    Take 1 tablet by mouth daily.   DOCOSAHEXAENOIC ACID PO    Take 1 tablet by mouth daily.   ETANERCEPT (ENBREL) 50 MG/ML INJECTION    Inject 0.98 mLs (50 mg total) into the skin once a week.   FLUTICASONE (FLONASE) 50 MCG/ACT NASAL SPRAY    USE TWO SPRAY(S) IN EACH NOSTRIL ONCE DAILY   FOLIC ACID (FOLVITE) 1 MG TABLET    Take 1 mg by mouth daily.   KRILL OIL OMEGA-3 PO    Take 1 each by mouth daily.   MELATONIN 5 MG CAPS    Take 1 each by mouth at bedtime.   METHOTREXATE 2.5 MG TABLET    Take 20 mg by mouth once a week.   MISC NATURAL PRODUCTS (OSTEO BI-FLEX ADV JOINT SHIELD) TABS    Take 1 each by mouth daily.   MULTIPLE VITAMINS-MINERALS (CENTRUM SILVER ADULT 50+ PO)    Take 1 each by mouth daily.   OVER THE COUNTER MEDICATION    Systane Balance Opht-Apply 1 drop to each eye daily.   OVER THE COUNTER MEDICATION    Zaditor Eye Drops-Apply 1  drop to each eye daily.   PANTOPRAZOLE (PROTONIX) 40 MG TABLET    Take 40 mg by mouth daily.   PREGABALIN (LYRICA) 75 MG CAPSULE    Take 75 mg by mouth daily. Reported on 05/29/2016   VALACYCLOVIR (VALTREX) 500 MG TABLET    Take 1 tablet (500 mg total) by mouth 2 (two) times daily. Take 1 tablet (500 mg total) by mouth 2 (two) times daily as needed.   VITAMIN E 400 UNIT CAPSULE    Take 400 Units by mouth daily.  Modified Medications   No medications on file  Discontinued Medications   No medications on file    Subjective: Craig Morales is in for his routine HIV follow-up visit.  He has had no problems obtaining, taking or tolerating Biktarvy.  He never misses a single dose.  He had left wrist fusion in August and has recovered well.  His rheumatoid arthritis is causing a lot of left hip pain, neck pain and right wrist pain.  Review of Systems: Review of Systems  Constitutional: Negative for chills, diaphoresis and fever.  Gastrointestinal: Negative for abdominal pain, diarrhea, nausea and vomiting.  Musculoskeletal: Positive for joint pain and neck pain.  Skin: Negative for rash.  Past Medical History:  Diagnosis Date  . GERD (gastroesophageal reflux disease)   . HIV (human immunodeficiency virus infection) (HCC)   . Seasonal allergies     Social History   Tobacco Use  . Smoking status: Never Smoker  . Smokeless tobacco: Never Used  Substance Use Topics  . Alcohol use: Yes    Alcohol/week: 7.0 standard drinks    Types: 7 Standard drinks or equivalent per week    Comment: occasional  . Drug use: No    Family History  Problem Relation Age of Onset  . Bleeding Disorder Neg Hx     Allergies  Allergen Reactions  . Oxycontin [Oxycodone Hcl] Shortness Of Breath and Palpitations  . Hydrocodone     REACTION: SOB and chest pain  . Iodine Diarrhea and Nausea And Vomiting    In high doses.    Health Maintenance  Topic Date Due  . TETANUS/TDAP  10/08/2023  . COLONOSCOPY   04/07/2027  . INFLUENZA VACCINE  Completed  . Hepatitis C Screening  Completed  . HIV Screening  Completed    Objective:  Vitals:   11/29/18 1619  BP: 115/74  Pulse: (!) 59  Temp: 98 F (36.7 C)  Weight: 162 lb (73.5 kg)  Height: 5\' 11"  (1.803 m)   Body mass index is 22.59 kg/m.  Physical Exam Constitutional:      Comments: He is talkative and in good spirits as usual.  HENT:     Mouth/Throat:     Pharynx: No oropharyngeal exudate.  Cardiovascular:     Rate and Rhythm: Normal rate and regular rhythm.     Heart sounds: No murmur.  Pulmonary:     Effort: Pulmonary effort is normal.     Breath sounds: Normal breath sounds.  Psychiatric:        Mood and Affect: Mood normal.     Lab Results Lab Results  Component Value Date   WBC 6.0 11/15/2018   HGB 14.2 11/15/2018   HCT 42.4 11/15/2018   MCV 93.0 11/15/2018   PLT 234 11/15/2018    Lab Results  Component Value Date   CREATININE 0.97 11/15/2018   BUN 17 11/15/2018   NA 140 11/15/2018   K 4.4 11/15/2018   CL 103 11/15/2018   CO2 31 11/15/2018    Lab Results  Component Value Date   ALT 23 11/15/2018   AST 27 11/15/2018   ALKPHOS 43 05/19/2017   BILITOT 0.6 11/15/2018    Lab Results  Component Value Date   CHOL 198 11/15/2018   HDL 71 11/15/2018   LDLCALC 112 (H) 11/15/2018   TRIG 60 11/15/2018   CHOLHDL 2.8 11/15/2018   Lab Results  Component Value Date   LABRPR NON-REACTIVE 11/15/2018   HIV 1 RNA Quant (copies/mL)  Date Value  11/15/2018 <20 NOT DETECTED  11/12/2017 <20 NOT DETECTED  05/19/2017 <20 NOT DETECTED   CD4 T Cell Abs (/uL)  Date Value  11/15/2018 620  11/12/2017 780  05/19/2017 750     Problem List Items Addressed This Visit      High   Human immunodeficiency virus (HIV) disease (HCC)    His infection remains under excellent, long-term control.  He will continue Biktarvy and follow-up here after lab work in 1 year.      Relevant Orders   Pneumococcal conjugate vaccine  13-valent IM   CBC   T-helper cell (CD4)- ([facility] clinic only)   Comprehensive metabolic panel   Lipid panel  RPR   HIV-1 RNA quant-no reflex-bld    Other Visit Diagnoses    Need for vaccination with 13-polyvalent pneumococcal conjugate vaccine    -  Primary   Relevant Orders   Pneumococcal conjugate vaccine 13-valent IM        Cliffton Asters, MD [facility] [phone] pager   [phone] cell 11/29/2018, 4:42 PM

## 2018-12-27 DIAGNOSIS — M5412 Radiculopathy, cervical region: Secondary | ICD-10-CM | POA: Diagnosis not present

## 2018-12-29 DIAGNOSIS — T8484XA Pain due to internal orthopedic prosthetic devices, implants and grafts, initial encounter: Secondary | ICD-10-CM | POA: Diagnosis not present

## 2019-01-05 DIAGNOSIS — T8484XD Pain due to internal orthopedic prosthetic devices, implants and grafts, subsequent encounter: Secondary | ICD-10-CM | POA: Diagnosis not present

## 2019-01-13 DIAGNOSIS — M7062 Trochanteric bursitis, left hip: Secondary | ICD-10-CM | POA: Diagnosis not present

## 2019-01-13 DIAGNOSIS — T8484XA Pain due to internal orthopedic prosthetic devices, implants and grafts, initial encounter: Secondary | ICD-10-CM | POA: Diagnosis not present

## 2019-01-17 DIAGNOSIS — M069 Rheumatoid arthritis, unspecified: Secondary | ICD-10-CM | POA: Diagnosis not present

## 2019-01-17 DIAGNOSIS — M25539 Pain in unspecified wrist: Secondary | ICD-10-CM | POA: Diagnosis not present

## 2019-01-17 DIAGNOSIS — M1180 Other specified crystal arthropathies, unspecified site: Secondary | ICD-10-CM | POA: Diagnosis not present

## 2019-01-17 DIAGNOSIS — M8589 Other specified disorders of bone density and structure, multiple sites: Secondary | ICD-10-CM | POA: Diagnosis not present

## 2019-01-17 DIAGNOSIS — Z79899 Other long term (current) drug therapy: Secondary | ICD-10-CM | POA: Diagnosis not present

## 2019-02-01 DIAGNOSIS — M5412 Radiculopathy, cervical region: Secondary | ICD-10-CM | POA: Diagnosis not present

## 2019-02-22 ENCOUNTER — Other Ambulatory Visit: Payer: Self-pay | Admitting: Internal Medicine

## 2019-02-22 DIAGNOSIS — B20 Human immunodeficiency virus [HIV] disease: Secondary | ICD-10-CM

## 2019-03-28 DIAGNOSIS — M5412 Radiculopathy, cervical region: Secondary | ICD-10-CM | POA: Diagnosis not present

## 2019-05-03 DIAGNOSIS — M5412 Radiculopathy, cervical region: Secondary | ICD-10-CM | POA: Diagnosis not present

## 2019-05-03 DIAGNOSIS — M47812 Spondylosis without myelopathy or radiculopathy, cervical region: Secondary | ICD-10-CM | POA: Diagnosis not present

## 2019-05-16 DIAGNOSIS — M25539 Pain in unspecified wrist: Secondary | ICD-10-CM | POA: Diagnosis not present

## 2019-05-16 DIAGNOSIS — M1180 Other specified crystal arthropathies, unspecified site: Secondary | ICD-10-CM | POA: Diagnosis not present

## 2019-05-16 DIAGNOSIS — Z79899 Other long term (current) drug therapy: Secondary | ICD-10-CM | POA: Diagnosis not present

## 2019-05-16 DIAGNOSIS — M069 Rheumatoid arthritis, unspecified: Secondary | ICD-10-CM | POA: Diagnosis not present

## 2019-05-18 ENCOUNTER — Telehealth: Payer: Self-pay | Admitting: *Deleted

## 2019-05-18 ENCOUNTER — Other Ambulatory Visit: Payer: Self-pay

## 2019-05-18 ENCOUNTER — Encounter: Payer: Self-pay | Admitting: Family Medicine

## 2019-05-18 ENCOUNTER — Ambulatory Visit: Payer: Self-pay | Admitting: *Deleted

## 2019-05-18 ENCOUNTER — Telehealth: Payer: Self-pay

## 2019-05-18 ENCOUNTER — Other Ambulatory Visit: Payer: BC Managed Care – PPO

## 2019-05-18 ENCOUNTER — Ambulatory Visit (INDEPENDENT_AMBULATORY_CARE_PROVIDER_SITE_OTHER): Payer: BC Managed Care – PPO | Admitting: Family Medicine

## 2019-05-18 VITALS — Temp 101.0°F | Wt 158.0 lb

## 2019-05-18 DIAGNOSIS — K529 Noninfective gastroenteritis and colitis, unspecified: Secondary | ICD-10-CM | POA: Diagnosis not present

## 2019-05-18 DIAGNOSIS — R6889 Other general symptoms and signs: Secondary | ICD-10-CM | POA: Diagnosis not present

## 2019-05-18 DIAGNOSIS — R509 Fever, unspecified: Secondary | ICD-10-CM | POA: Diagnosis not present

## 2019-05-18 DIAGNOSIS — Z20822 Contact with and (suspected) exposure to covid-19: Secondary | ICD-10-CM

## 2019-05-18 MED ORDER — ONDANSETRON 8 MG PO TBDP: 8.0000 mg | ORAL_TABLET | Freq: Three times a day (TID) | ORAL | 0 refills | Status: DC | PRN

## 2019-05-18 NOTE — Progress Notes (Signed)
Virtual Visit via Video Note  I connected with Craig Morales on 05/18/19 at 11:20 AM EDT by a video enabled telemedicine application and verified that I am speaking with the correct person using two identifiers.  Location: Patient: home with partner  Provider: home    I discussed the limitations of evaluation and management by telemedicine and the availability of in person appointments. The patient expressed understanding and agreed to proceed.  History of Present Illness: Pt is home with c/o of feeling tired yesterday and slept.  Today he got up and temp 101 ---pt took tylenol this am  + NVD      Observations/Objective: Temp 101   No other vitals obtained  Pt wrapped up in a blanket due to chills Pale    Assessment and Plan: 1. Gastroenteritis Will get pt tested for covid Pt instructed to drink pedialyte/ gatorade slowly zofran sent to pharmacy To to ER if symptoms do not improve - ondansetron (ZOFRAN ODT) 8 MG disintegrating tablet; Take 1 tablet (8 mg total) by mouth every 8 (eight) hours as needed for nausea or vomiting.  Dispense: 20 tablet; Refill: 0   Follow Up Instructions:    I discussed the assessment and treatment plan with the patient. The patient was provided an opportunity to ask questions and all were answered. The patient agreed with the plan and demonstrated an understanding of the instructions.   The patient was advised to call back or seek an in-person evaluation if the symptoms worsen or if the condition fails to improve as anticipated.  I provided 15 minutes of non-face-to-face time during this encounter.   Ann Held, DO

## 2019-05-18 NOTE — Telephone Encounter (Signed)
Virtual visit scheduled.  

## 2019-05-18 NOTE — Telephone Encounter (Signed)
Pt reports fever, chills, headache, nausea, vomiting, onset yesterday. Temp max at 101.0 this AM after tylenol. States yesterday temp 100.0 States 1 episode of vomiting this AM, severe nausea, states able to stay hydrated. Reports mild SOB, "From the nausea." No travel, no known exposure. Pt also reports urinary urgency, denies dysuria, frequency "Just can hardly hold it when I have to urinate." Call transferred to practice, [facility], for consideration of appt. Pts email and ph # verified. Care advise given per protocol, pt verbalizes understanding.   Reason for Disposition . [1] Fever returns after gone for over 24 hours AND [2] symptoms worse or not improved  Answer Assessment - Initial Assessment Questions 1. COVID-19 DIAGNOSIS: "Who made your Coronavirus (COVID-19) diagnosis?" "Was it confirmed by a positive lab test?" If not diagnosed by a HCP, ask "Are there lots of cases (community spread) where you live?" (See public health department website, if unsure)     NA 2. ONSET: "When did the COVID-19 symptoms start?"      yesterday 3. WORST SYMPTOM: "What is your worst symptom?" (e.g., cough, fever, shortness of breath, muscle aches)     fever 4. COUGH: "Do you have a cough?" If so, ask: "How bad is the cough?"       no 5. FEVER: "Do you have a fever?" If so, ask: "What is your temperature, how was it measured, and when did it start?"     100.0-101.0  This Am 101.0 6. RESPIRATORY STATUS: "Describe your breathing?" (e.g., shortness of breath, wheezing, unable to speak)      Mild SOB with nausea 7. BETTER-SAME-WORSE: "Are you getting better, staying the same or getting worse compared to yesterday?"  If getting worse, ask, "In what way?"     worse 8. HIGH RISK DISEASE: "Do you have any chronic medical problems?" (e.g., asthma, heart or lung disease, weak immune system, etc.)     RA. Had Drs appt Friday 10. OTHER SYMPTOMS: "Do you have any other symptoms?"  (e.g., chills, fatigue, headache, loss of  smell or taste, muscle pain, sore throat)      Headache, chills, 1 episode of vomiting, urinary urgency, no dysuria  Protocols used: CORONAVIRUS (COVID-19) DIAGNOSED OR SUSPECTED-A-AH

## 2019-05-18 NOTE — Telephone Encounter (Signed)
Dr. Carollee Herter would like covid testing for this patient.  He has fever of 101, headache, chills, nausea and vomiting.

## 2019-05-18 NOTE — Telephone Encounter (Signed)
Patient called to have his covid test scheduled, appointment scheduled for today at 1400 at [facility], advised of location and to wear a mask for everyone in the vehicle, he verbalized understanding. Order placed.

## 2019-05-18 NOTE — Telephone Encounter (Signed)
Scheduled today

## 2019-05-19 LAB — NOVEL CORONAVIRUS, NAA: SARS-CoV-2, NAA: NOT DETECTED

## 2019-05-20 ENCOUNTER — Ambulatory Visit: Payer: Self-pay

## 2019-05-20 NOTE — Telephone Encounter (Signed)
Incoming cal  From Patient with a complaint of Fever and stomach ache.  temperature going back up  Reports 99.2 99.9 latest readings.  Reports Headache and right ear hurts. Took Tylenol 500mg  at 4:00pm.  Reports that he his HIV positive.  Denies travel out of Country. Reviewed protocol which recommend Pt go to ED.  Reason for Disposition . [1] Headache AND [2] stiff neck (can't touch chin to chest)  Answer Assessment - Initial Assessment Questions 1. TEMPERATURE: "What is the most recent temperature?"  "How was it measured?"     digital 2. ONSET: "When did the fever start?"      1:00pm 3. SYMPTOMS: "Do you have any other symptoms besides the fever?"  (e.g., colds, headache, sore throat, earache, cough, rash, diarrhea, vomiting, abdominal pain)     Headache right ear hurts, feel bloated not much of appetite 4. CAUSE: If there are no symptoms, ask: "What do you think is causing the fever?"       5. CONTACTS: "Does anyone else in the family have an infection?"     *No Answer* 6. TREATMENT: "What have you done so far to treat this fever?" (e.g., medications)     Tylenol 500mg  around 400pm 7. IMMUNOCOMPROMISE: "Do you have of the following: diabetes, HIV positive, splenectomy, cancer chemotherapy, chronic steroid treatment, transplant patient, etc."    Hiv positives undetectable for 10 years, arthritis rheumatoid  8. PREGNANCY: "Is there any chance you are pregnant?" "When was your last menstrual period?"     na 9. TRAVEL: "Have you traveled out of the country in the last month?" (e.g., travel history, exposures)     Denies  Protocols used: FEVER-A-AH

## 2019-05-22 DIAGNOSIS — N3001 Acute cystitis with hematuria: Secondary | ICD-10-CM | POA: Diagnosis not present

## 2019-05-22 DIAGNOSIS — R399 Unspecified symptoms and signs involving the genitourinary system: Secondary | ICD-10-CM | POA: Diagnosis not present

## 2019-06-03 DIAGNOSIS — N390 Urinary tract infection, site not specified: Secondary | ICD-10-CM | POA: Diagnosis not present

## 2019-06-27 DIAGNOSIS — M4802 Spinal stenosis, cervical region: Secondary | ICD-10-CM | POA: Diagnosis not present

## 2019-06-29 ENCOUNTER — Telehealth: Payer: Self-pay | Admitting: Family Medicine

## 2019-06-29 NOTE — Telephone Encounter (Signed)
Noted  

## 2019-06-29 NOTE — Telephone Encounter (Signed)
The patient had a virtual appt. On 6/10 and due to symptoms sent to get Covid testing. Test results were given on 6/12 as negative. Patient was still experiencing same symptoms as discussed on 6/10 visit. He did attempt to contact our office but we were closed. He was seen over that weekend and diagnosed for a UTI. He stated he had continued to have fever. The patient was disappointment that once results of Covid were given that he never was followup on regarding how he was doing and was he still having symptoms.

## 2019-06-30 ENCOUNTER — Encounter: Payer: Managed Care, Other (non HMO) | Admitting: Family Medicine

## 2019-07-21 ENCOUNTER — Other Ambulatory Visit: Payer: Self-pay

## 2019-07-22 ENCOUNTER — Encounter: Payer: Self-pay | Admitting: Family Medicine

## 2019-07-22 ENCOUNTER — Ambulatory Visit (INDEPENDENT_AMBULATORY_CARE_PROVIDER_SITE_OTHER): Payer: BC Managed Care – PPO | Admitting: Family Medicine

## 2019-07-22 VITALS — BP 102/62 | HR 68 | Temp 97.1°F | Ht 71.0 in | Wt 157.0 lb

## 2019-07-22 DIAGNOSIS — Z0000 Encounter for general adult medical examination without abnormal findings: Secondary | ICD-10-CM | POA: Diagnosis not present

## 2019-07-22 DIAGNOSIS — Z125 Encounter for screening for malignant neoplasm of prostate: Secondary | ICD-10-CM

## 2019-07-22 LAB — COMPREHENSIVE METABOLIC PANEL
ALT: 20 U/L (ref 0–53)
AST: 25 U/L (ref 0–37)
Albumin: 4.3 g/dL (ref 3.5–5.2)
Alkaline Phosphatase: 76 U/L (ref 39–117)
BUN: 20 mg/dL (ref 6–23)
CO2: 30 mEq/L (ref 19–32)
Calcium: 9.3 mg/dL (ref 8.4–10.5)
Chloride: 102 mEq/L (ref 96–112)
Creatinine, Ser: 0.95 mg/dL (ref 0.40–1.50)
GFR: 79.97 mL/min (ref >60.00)
Glucose, Bld: 83 mg/dL (ref 70–99)
Potassium: 4.1 mEq/L (ref 3.5–5.1)
Sodium: 138 mEq/L (ref 135–145)
Total Bilirubin: 0.8 mg/dL (ref 0.2–1.2)
Total Protein: 6.8 g/dL (ref 6.0–8.3)

## 2019-07-22 LAB — CBC
HCT: 40.6 % (ref 39.0–52.0)
Hemoglobin: 13.6 g/dL (ref 13.0–17.0)
MCHC: 33.6 g/dL (ref 30.0–36.0)
MCV: 94.3 fl (ref 78.0–100.0)
Platelets: 219 10*3/uL (ref 150.0–400.0)
RBC: 4.3 Mil/uL (ref 4.22–5.81)
RDW: 14.8 % (ref 11.5–15.5)
WBC: 6.4 10*3/uL (ref 4.0–10.5)

## 2019-07-22 LAB — LIPID PANEL
Cholesterol: 144 mg/dL (ref 0–200)
HDL: 58.5 mg/dL (ref >39.00)
LDL Cholesterol: 74 mg/dL (ref 0–99)
NonHDL: 85.13
Total CHOL/HDL Ratio: 2
Triglycerides: 57 mg/dL (ref 0.0–149.0)
VLDL: 11.4 mg/dL (ref 0.0–40.0)

## 2019-07-22 LAB — PSA: PSA: 1.54 ng/mL (ref 0.10–4.00)

## 2019-07-22 NOTE — Patient Instructions (Addendum)
The new Shingrix vaccine (for shingles) is a 2 shot series. It can make people feel low energy, achy and almost like they have the flu for 48 hours after injection. Please plan accordingly when deciding on when to get this shot. Call our office for a nurse visit appointment to get this. The second shot of the series is less severe regarding the side effects, but it still lasts 48 hours.   Keep the diet clean and stay active.  I recommend getting the flu shot in mid October. This suggestion would change if the CDC comes out with a different recommendation.   Give [place] 2-3 business days to get the results of your labs back.   Let us know if you need anything.

## 2019-07-22 NOTE — Progress Notes (Signed)
Chief Complaint  Patient presents with  . Annual Exam    Well Male Craig Morales is here for a complete physical.   His last physical was >1 year ago.  Current diet: in general, an "OK" diet.  Current exercise: less exercise than before; doing some home work outs Weight trend: stable Daytime fatigue? No. Seat belt? Yes.    Health maintenance Shingrix- No Colonoscopy- Yes Tetanus- Yes Hep C- Yes   Past Medical History:  Diagnosis Date  . GERD (gastroesophageal reflux disease)   . HIV (human immunodeficiency virus infection) ([place])   . Seasonal allergies       Past Surgical History:  Procedure Laterality Date  . KNEE SURGERY    . NASAL SEPTUM SURGERY      Medications  Current Outpatient Medications on File Prior to Visit  Medication Sig Dispense Refill  . Ascorbic Acid (VITAMIN C WITH ROSE HIPS) 500 MG tablet Take 500 mg by mouth daily.    . Azelastine HCl 0.15 % SOLN Place 2 sprays into the nose daily.    Marland Kitchen BIKTARVY 50-200-25 MG TABS tablet TAKE 1 TABLET BY MOUTH DAILY 90 tablet 1  . etanercept (ENBREL) 50 MG/ML injection Inject 0.98 mLs (50 mg total) into the skin once a week. 0.98 mL   . fluticasone (FLONASE) 50 MCG/ACT nasal spray USE TWO SPRAY(S) IN EACH NOSTRIL ONCE DAILY 16 g 6  . folic acid (FOLVITE) 1 MG tablet Take 1 mg by mouth daily.    Marland Kitchen KRILL OIL OMEGA-3 PO Take 1 each by mouth daily.    . Melatonin 5 MG CAPS Take 1 each by mouth at bedtime.    . methotrexate 2.5 MG tablet Take 20 mg by mouth once a week.    . Misc Natural Products (OSTEO BI-FLEX ADV JOINT SHIELD) TABS Take 1 each by mouth daily.    . Multiple Vitamins-Minerals (CENTRUM SILVER ADULT 50+ PO) Take 1 each by mouth daily.    . ondansetron (ZOFRAN ODT) 8 MG disintegrating tablet Take 1 tablet (8 mg total) by mouth every 8 (eight) hours as needed for nausea or vomiting. 20 tablet 0  . OVER THE COUNTER MEDICATION Systane Balance Opht-Apply 1 drop to each eye daily.    Marland Kitchen OVER THE COUNTER  MEDICATION Zaditor Eye Drops-Apply 1 drop to each eye daily.    . pantoprazole (PROTONIX) 40 MG tablet Take 40 mg by mouth daily.    . pregabalin (LYRICA) 75 MG capsule Take 75 mg by mouth daily. Reported on 05/29/2016    . valACYclovir (VALTREX) 500 MG tablet Take 1 tablet (500 mg total) by mouth 2 (two) times daily. Take 1 tablet (500 mg total) by mouth 2 (two) times daily as needed. 90 tablet 2  . vitamin E 400 UNIT capsule Take 400 Units by mouth daily.     Allergies Allergies  Allergen Reactions  . Oxycontin [Oxycodone Hcl] Shortness Of Breath and Palpitations  . Hydrocodone     REACTION: SOB and chest pain  . Iodine Diarrhea and Nausea And Vomiting    In high doses.    Family History Family History  Problem Relation Age of Onset  . Bleeding Disorder Neg Hx     Review of Systems: Constitutional:  no fevers Eye:  no recent significant change in vision Ear/Nose/Mouth/Throat:  Ears:  no hearing loss Nose/Mouth/Throat:  no complaints of nasal congestion, no sore throat Cardiovascular:  no chest pain, no palpitations Respiratory:  no cough and no shortness of  breath Gastrointestinal:  no abdominal pain, no change in bowel habits GU:  Male: negative for dysuria, frequency, and incontinence and negative for prostate symptoms Musculoskeletal/Extremities: +chronic arthritic pains; otherwise no new pain, redness, or swelling of the joints Integumentary (Skin/Breast):  no abnormal skin lesions reported Neurologic:  no headaches Endocrine: No unexpected weight changes Hematologic/Lymphatic:  no abnormal bleeding  Exam BP 102/62 (BP Location: Left Arm, Patient Position: Sitting, Cuff Size: Normal)   Pulse 68   Temp (!) 97.1 F (36.2 C) (Temporal)   Ht 5\' 11"  (1.803 m)   Wt 157 lb (71.2 kg)   SpO2 96%   BMI 21.90 kg/m  General:  well developed, well nourished, in no apparent distress Skin:  no significant moles, warts, or growths Head:  no masses, lesions, or tenderness Eyes:   pupils equal and round, sclera anicteric without injection Ears:  canals without lesions, TMs shiny without retraction, no obvious effusion, no erythema Nose:  nares patent, septum midline, mucosa normal Throat/Pharynx:  lips and gingiva without lesion; tongue and uvula midline; non-inflamed pharynx; no exudates or postnasal drainage Neck: neck supple without adenopathy, thyromegaly, or masses Lungs:  clear to auscultation, breath sounds equal bilaterally, no respiratory distress Cardio:  regular rate and rhythm, no LE edema, no bruits Abdomen:  abdomen soft, nontender; bowel sounds normal; no masses or organomegaly Rectal: Deferred Musculoskeletal:  symmetrical muscle groups noted without atrophy or deformity Extremities:  no clubbing, cyanosis, or edema, no deformities, no skin discoloration Neuro:  gait normal; deep tendon reflexes normal and symmetric Psych: well oriented with normal range of affect and appropriate judgment/insight  Assessment and Plan  Well adult exam - Plan: CBC, Comprehensive metabolic panel, Lipid panel  Screening for malignant neoplasm of prostate - Plan: PSA   Well 63 y.o. male. Counseled on diet and exercise. Counseled on risks and benefits of prostate cancer screening with PSA. The patient agrees to undergo testing. Immunizations, labs, and further orders as above. Follow up in 1 yr. The patient voiced understanding and agreement to the plan.  [address] [place], DO 07/22/19 10:19 AM

## 2019-07-25 ENCOUNTER — Other Ambulatory Visit: Payer: Self-pay | Admitting: Family Medicine

## 2019-07-25 DIAGNOSIS — M5412 Radiculopathy, cervical region: Secondary | ICD-10-CM | POA: Diagnosis not present

## 2019-07-25 DIAGNOSIS — M47812 Spondylosis without myelopathy or radiculopathy, cervical region: Secondary | ICD-10-CM | POA: Diagnosis not present

## 2019-07-25 DIAGNOSIS — R972 Elevated prostate specific antigen [PSA]: Secondary | ICD-10-CM

## 2019-08-04 DIAGNOSIS — H903 Sensorineural hearing loss, bilateral: Secondary | ICD-10-CM | POA: Diagnosis not present

## 2019-08-04 DIAGNOSIS — R0683 Snoring: Secondary | ICD-10-CM | POA: Diagnosis not present

## 2019-08-04 DIAGNOSIS — J301 Allergic rhinitis due to pollen: Secondary | ICD-10-CM | POA: Diagnosis not present

## 2019-08-19 ENCOUNTER — Other Ambulatory Visit: Payer: Self-pay | Admitting: Internal Medicine

## 2019-08-19 DIAGNOSIS — B20 Human immunodeficiency virus [HIV] disease: Secondary | ICD-10-CM

## 2019-08-30 DIAGNOSIS — K580 Irritable bowel syndrome with diarrhea: Secondary | ICD-10-CM | POA: Diagnosis not present

## 2019-08-30 DIAGNOSIS — K219 Gastro-esophageal reflux disease without esophagitis: Secondary | ICD-10-CM | POA: Diagnosis not present

## 2019-09-02 ENCOUNTER — Other Ambulatory Visit: Payer: Self-pay

## 2019-09-02 ENCOUNTER — Other Ambulatory Visit (INDEPENDENT_AMBULATORY_CARE_PROVIDER_SITE_OTHER): Payer: BC Managed Care – PPO

## 2019-09-02 DIAGNOSIS — R972 Elevated prostate specific antigen [PSA]: Secondary | ICD-10-CM | POA: Diagnosis not present

## 2019-09-02 LAB — PSA: PSA: 1.42 ng/mL (ref 0.10–4.00)

## 2019-09-15 DIAGNOSIS — M1180 Other specified crystal arthropathies, unspecified site: Secondary | ICD-10-CM | POA: Diagnosis not present

## 2019-09-15 DIAGNOSIS — M25539 Pain in unspecified wrist: Secondary | ICD-10-CM | POA: Diagnosis not present

## 2019-09-15 DIAGNOSIS — M069 Rheumatoid arthritis, unspecified: Secondary | ICD-10-CM | POA: Diagnosis not present

## 2019-09-15 DIAGNOSIS — Z79899 Other long term (current) drug therapy: Secondary | ICD-10-CM | POA: Diagnosis not present

## 2019-09-29 DIAGNOSIS — M5412 Radiculopathy, cervical region: Secondary | ICD-10-CM | POA: Diagnosis not present

## 2019-10-28 DIAGNOSIS — M47812 Spondylosis without myelopathy or radiculopathy, cervical region: Secondary | ICD-10-CM | POA: Diagnosis not present

## 2019-10-28 DIAGNOSIS — M4802 Spinal stenosis, cervical region: Secondary | ICD-10-CM | POA: Diagnosis not present

## 2019-11-17 ENCOUNTER — Other Ambulatory Visit: Payer: Self-pay

## 2019-11-17 ENCOUNTER — Other Ambulatory Visit: Payer: BC Managed Care – PPO

## 2019-11-17 DIAGNOSIS — B20 Human immunodeficiency virus [HIV] disease: Secondary | ICD-10-CM | POA: Diagnosis not present

## 2019-11-18 LAB — T-HELPER CELL (CD4) - (RCID CLINIC ONLY)
CD4 % Helper T Cell: 37 % (ref 33–65)
CD4 T Cell Abs: 768 /uL (ref 400–1790)

## 2019-11-26 LAB — LIPID PANEL
Cholesterol: 149 mg/dL (ref <200)
HDL: 57 mg/dL (ref >=40)
LDL Cholesterol (Calc): 76 mg/dL (calc)
Non-HDL Cholesterol (Calc): 92 mg/dL (calc) (ref <130)
Total CHOL/HDL Ratio: 2.6 (calc) (ref <5.0)
Triglycerides: 84 mg/dL (ref <150)

## 2019-11-26 LAB — COMPREHENSIVE METABOLIC PANEL
AG Ratio: 1.6 (calc) (ref 1.0–2.5)
ALT: 22 U/L (ref 9–46)
AST: 27 U/L (ref 10–35)
Albumin: 4.2 g/dL (ref 3.6–5.1)
Alkaline phosphatase (APISO): 69 U/L (ref 35–144)
BUN: 16 mg/dL (ref 7–25)
CO2: 29 mmol/L (ref 20–32)
Calcium: 9.6 mg/dL (ref 8.6–10.3)
Chloride: 103 mmol/L (ref 98–110)
Creat: 0.96 mg/dL (ref 0.70–1.25)
Globulin: 2.6 g/dL (calc) (ref 1.9–3.7)
Glucose, Bld: 82 mg/dL (ref 65–99)
Potassium: 4.8 mmol/L (ref 3.5–5.3)
Sodium: 139 mmol/L (ref 135–146)
Total Bilirubin: 0.5 mg/dL (ref 0.2–1.2)
Total Protein: 6.8 g/dL (ref 6.1–8.1)

## 2019-11-26 LAB — CBC
HCT: 42.7 % (ref 38.5–50.0)
Hemoglobin: 14.4 g/dL (ref 13.2–17.1)
MCH: 31.5 pg (ref 27.0–33.0)
MCHC: 33.7 g/dL (ref 32.0–36.0)
MCV: 93.4 fL (ref 80.0–100.0)
MPV: 10.4 fL (ref 7.5–12.5)
Platelets: 230 10*3/uL (ref 140–400)
RBC: 4.57 10*6/uL (ref 4.20–5.80)
RDW: 14.1 % (ref 11.0–15.0)
WBC: 6 10*3/uL (ref 3.8–10.8)

## 2019-11-26 LAB — RPR: RPR Ser Ql: NONREACTIVE

## 2019-11-26 LAB — HIV-1 RNA QUANT-NO REFLEX-BLD
HIV 1 RNA Quant: 26 copies/mL — ABNORMAL HIGH
HIV-1 RNA Quant, Log: 1.41 Log copies/mL — ABNORMAL HIGH

## 2019-11-28 ENCOUNTER — Other Ambulatory Visit: Payer: Self-pay | Admitting: Family Medicine

## 2019-11-28 DIAGNOSIS — R972 Elevated prostate specific antigen [PSA]: Secondary | ICD-10-CM

## 2019-11-30 ENCOUNTER — Ambulatory Visit: Payer: BC Managed Care – PPO | Admitting: Internal Medicine

## 2019-11-30 ENCOUNTER — Other Ambulatory Visit: Payer: Self-pay

## 2019-11-30 ENCOUNTER — Encounter: Payer: Self-pay | Admitting: Internal Medicine

## 2019-11-30 DIAGNOSIS — B20 Human immunodeficiency virus [HIV] disease: Secondary | ICD-10-CM | POA: Diagnosis not present

## 2019-11-30 NOTE — Assessment & Plan Note (Signed)
He was concerned that his viral load had gone up to 26 after 5 years of being undetectable.  I told him that this is not a problem at all.  He continues to have excellent, long-term control of his infection.  He will continue Biktarvy and follow-up in 1 year.

## 2019-11-30 NOTE — Progress Notes (Signed)
Patient Active Problem List   Diagnosis Date Noted  . Human immunodeficiency virus (HIV) disease (HCC) 02/12/2007    Priority: High  . Rheumatoid arthritis (HCC) 04/14/2016    Priority: Medium  . History of calcium pyrophosphate deposition disease (CPPD) 07/25/2013    Priority: Medium  . Visit for preventive health examination 10/07/2013  . Gout 10/07/2013  . Degenerative arthritis of spine 11/27/2011  . EPISTAXIS, RECURRENT 11/26/2010  . IRRITABLE BOWEL SYNDROME 05/23/2008  . HERPES LABIALIS 02/23/2007  . GERD 02/23/2007  . HERPES ZOSTER, UNCOMPLICATED 02/12/2007  . DEVIATED NASAL SEPTUM 02/12/2007  . ALLERGIC RHINITIS 02/12/2007  . PSORIASIS 02/12/2007  . DIARRHEA 02/12/2007  . KNEE INJURY 02/12/2007    Patient's Medications  New Prescriptions   No medications on file  Previous Medications   ASCORBIC ACID (VITAMIN C WITH ROSE HIPS) 500 MG TABLET    Take 500 mg by mouth daily.   AZELASTINE HCL 0.15 % SOLN    Place 2 sprays into the nose daily.   BIKTARVY 50-200-25 MG TABS TABLET    TAKE 1 TABLET BY MOUTH EVERY DAY   ETANERCEPT (ENBREL) 50 MG/ML INJECTION    Inject 0.98 mLs (50 mg total) into the skin once a week.   FLUTICASONE (FLONASE) 50 MCG/ACT NASAL SPRAY    USE TWO SPRAY(S) IN EACH NOSTRIL ONCE DAILY   FOLIC ACID (FOLVITE) 1 MG TABLET    Take 1 mg by mouth daily.   KRILL OIL OMEGA-3 PO    Take 1 each by mouth daily.   MELATONIN 5 MG CAPS    Take 1 each by mouth at bedtime.   METHOTREXATE 2.5 MG TABLET    Take 20 mg by mouth once a week.   MISC NATURAL PRODUCTS (OSTEO BI-FLEX ADV JOINT SHIELD) TABS    Take 1 each by mouth daily.   MULTIPLE VITAMINS-MINERALS (CENTRUM SILVER ADULT 50+ PO)    Take 1 each by mouth daily.   OVER THE COUNTER MEDICATION    Systane Balance Opht-Apply 1 drop to each eye daily.   OVER THE COUNTER MEDICATION    Zaditor Eye Drops-Apply 1 drop to each eye daily.   PANTOPRAZOLE (PROTONIX) 40 MG TABLET    Take 40 mg by mouth daily.   PREGABALIN (LYRICA) 75 MG CAPSULE    Take 75 mg by mouth daily. Reported on 05/29/2016   VALACYCLOVIR (VALTREX) 500 MG TABLET    Take 1 tablet (500 mg total) by mouth 2 (two) times daily. Take 1 tablet (500 mg total) by mouth 2 (two) times daily as needed.   VITAMIN E 400 UNIT CAPSULE    Take 400 Units by mouth daily.  Modified Medications   No medications on file  Discontinued Medications   No medications on file    Subjective: Harel is in for his routine HIV follow-up visit.  He has not had any problems obtaining, taking or tolerating his Biktarvy and, as usual, never misses doses.  He has been working from home during the [employer] pandemic and feels very safe.  He is doing well other than the aches and pains associated with his rheumatoid arthritis.  He thinks he will probably need to have his right wrist fused but does not want to do anything during the pandemic.  He is eager to take the Covid vaccine when it is available to him.  He has already received his influenza vaccine.  Review of Systems: Review of Systems  Constitutional: Negative for fever.  Respiratory: Negative for cough and shortness of breath.   Cardiovascular: Negative for chest pain.  Musculoskeletal: Positive for joint pain.    Past Medical History:  Diagnosis Date  . GERD (gastroesophageal reflux disease)   . HIV (human immunodeficiency virus infection) (HCC)   . Seasonal allergies     Social History   Tobacco Use  . Smoking status: Never Smoker  . Smokeless tobacco: Never Used  Substance Use Topics  . Alcohol use: Yes    Alcohol/week: 7.0 standard drinks    Types: 7 Standard drinks or equivalent per week    Comment: occasional  . Drug use: No    Family History  Problem Relation Age of Onset  . Bleeding Disorder Neg Hx     Allergies  Allergen Reactions  . Oxycontin [Oxycodone Hcl] Shortness Of Breath and Palpitations  . Hydrocodone     REACTION: SOB and chest pain  . Iodine Diarrhea and Nausea And  Vomiting    In high doses.    Health Maintenance  Topic Date Due  . TETANUS/TDAP  10/08/2023  . COLONOSCOPY  04/07/2027  . INFLUENZA VACCINE  Completed  . Hepatitis C Screening  Completed  . HIV Screening  Completed    Objective:  Vitals:   11/30/19 1540  BP: 120/76  Pulse: 65  Temp: 98.4 F (36.9 C)  TempSrc: Oral  Weight: 164 lb (74.4 kg)   Body mass index is 22.87 kg/m.  Physical Exam Constitutional:      Comments: He is in good spirits as usual.  Cardiovascular:     Rate and Rhythm: Normal rate and regular rhythm.     Heart sounds: No murmur.  Pulmonary:     Effort: Pulmonary effort is normal.     Breath sounds: Normal breath sounds.  Psychiatric:        Mood and Affect: Mood normal.     Lab Results Lab Results  Component Value Date   WBC 6.0 11/17/2019   HGB 14.4 11/17/2019   HCT 42.7 11/17/2019   MCV 93.4 11/17/2019   PLT 230 11/17/2019    Lab Results  Component Value Date   CREATININE 0.96 11/17/2019   BUN 16 11/17/2019   NA 139 11/17/2019   K 4.8 11/17/2019   CL 103 11/17/2019   CO2 29 11/17/2019    Lab Results  Component Value Date   ALT 22 11/17/2019   AST 27 11/17/2019   ALKPHOS 76 07/22/2019   BILITOT 0.5 11/17/2019    Lab Results  Component Value Date   CHOL 149 11/17/2019   HDL 57 11/17/2019   LDLCALC 76 11/17/2019   TRIG 84 11/17/2019   CHOLHDL 2.6 11/17/2019   Lab Results  Component Value Date   LABRPR NON-REACTIVE 11/17/2019   HIV 1 RNA Quant (copies/mL)  Date Value  11/17/2019 26 (H)  11/15/2018 <20 NOT DETECTED  11/12/2017 <20 NOT DETECTED   CD4 T Cell Abs (/uL)  Date Value  11/17/2019 768  11/15/2018 620  11/12/2017 780     Problem List Items Addressed This Visit      High   Human immunodeficiency virus (HIV) disease (HCC)    He was concerned that his viral load had gone up to 26 after 5 years of being undetectable.  I told him that this is not a problem at all.  He continues to have excellent,  long-term control of his infection.  He will continue Biktarvy and follow-up in 1 year.  Relevant Orders   1 Year CBC   1 Year CD4   1 Year CMP   1 Year Lipid panel   1 Year RPR   1 Year VL        Michel Bickers, MD [facility] for [place] [phone] pager   [phone] cell 11/30/2019, 4:13 PM

## 2019-12-16 ENCOUNTER — Other Ambulatory Visit (INDEPENDENT_AMBULATORY_CARE_PROVIDER_SITE_OTHER): Payer: BC Managed Care – PPO

## 2019-12-16 ENCOUNTER — Other Ambulatory Visit: Payer: Self-pay

## 2019-12-16 DIAGNOSIS — R972 Elevated prostate specific antigen [PSA]: Secondary | ICD-10-CM | POA: Diagnosis not present

## 2019-12-16 LAB — PSA: PSA: 1.58 ng/mL (ref 0.10–4.00)

## 2019-12-20 DIAGNOSIS — M069 Rheumatoid arthritis, unspecified: Secondary | ICD-10-CM | POA: Diagnosis not present

## 2019-12-20 DIAGNOSIS — Z79899 Other long term (current) drug therapy: Secondary | ICD-10-CM | POA: Diagnosis not present

## 2019-12-20 DIAGNOSIS — M25531 Pain in right wrist: Secondary | ICD-10-CM | POA: Diagnosis not present

## 2019-12-20 DIAGNOSIS — M1180 Other specified crystal arthropathies, unspecified site: Secondary | ICD-10-CM | POA: Diagnosis not present

## 2020-01-02 DIAGNOSIS — M4802 Spinal stenosis, cervical region: Secondary | ICD-10-CM | POA: Diagnosis not present

## 2020-01-05 DIAGNOSIS — G971 Other reaction to spinal and lumbar puncture: Secondary | ICD-10-CM | POA: Diagnosis not present

## 2020-02-01 DIAGNOSIS — M4802 Spinal stenosis, cervical region: Secondary | ICD-10-CM | POA: Diagnosis not present

## 2020-02-01 DIAGNOSIS — M47812 Spondylosis without myelopathy or radiculopathy, cervical region: Secondary | ICD-10-CM | POA: Diagnosis not present

## 2020-03-20 DIAGNOSIS — M1180 Other specified crystal arthropathies, unspecified site: Secondary | ICD-10-CM | POA: Diagnosis not present

## 2020-03-20 DIAGNOSIS — M1990 Unspecified osteoarthritis, unspecified site: Secondary | ICD-10-CM | POA: Diagnosis not present

## 2020-03-20 DIAGNOSIS — M069 Rheumatoid arthritis, unspecified: Secondary | ICD-10-CM | POA: Diagnosis not present

## 2020-03-20 DIAGNOSIS — Z79899 Other long term (current) drug therapy: Secondary | ICD-10-CM | POA: Diagnosis not present

## 2020-03-28 DIAGNOSIS — R040 Epistaxis: Secondary | ICD-10-CM | POA: Diagnosis not present

## 2020-03-28 DIAGNOSIS — L739 Follicular disorder, unspecified: Secondary | ICD-10-CM | POA: Diagnosis not present

## 2020-04-05 DIAGNOSIS — M4802 Spinal stenosis, cervical region: Secondary | ICD-10-CM | POA: Diagnosis not present

## 2020-05-03 DIAGNOSIS — M4802 Spinal stenosis, cervical region: Secondary | ICD-10-CM | POA: Diagnosis not present

## 2020-06-21 DIAGNOSIS — Z79899 Other long term (current) drug therapy: Secondary | ICD-10-CM | POA: Diagnosis not present

## 2020-06-21 DIAGNOSIS — M1180 Other specified crystal arthropathies, unspecified site: Secondary | ICD-10-CM | POA: Diagnosis not present

## 2020-06-21 DIAGNOSIS — M069 Rheumatoid arthritis, unspecified: Secondary | ICD-10-CM | POA: Diagnosis not present

## 2020-06-21 DIAGNOSIS — M1990 Unspecified osteoarthritis, unspecified site: Secondary | ICD-10-CM | POA: Diagnosis not present

## 2020-07-12 DIAGNOSIS — Z79899 Other long term (current) drug therapy: Secondary | ICD-10-CM | POA: Diagnosis not present

## 2020-07-12 DIAGNOSIS — D3131 Benign neoplasm of right choroid: Secondary | ICD-10-CM | POA: Diagnosis not present

## 2020-07-12 DIAGNOSIS — H11153 Pinguecula, bilateral: Secondary | ICD-10-CM | POA: Diagnosis not present

## 2020-07-12 DIAGNOSIS — H353131 Nonexudative age-related macular degeneration, bilateral, early dry stage: Secondary | ICD-10-CM | POA: Diagnosis not present

## 2020-07-12 DIAGNOSIS — H04123 Dry eye syndrome of bilateral lacrimal glands: Secondary | ICD-10-CM | POA: Diagnosis not present

## 2020-07-16 DIAGNOSIS — M4802 Spinal stenosis, cervical region: Secondary | ICD-10-CM | POA: Diagnosis not present

## 2020-07-27 ENCOUNTER — Encounter: Payer: BC Managed Care – PPO | Admitting: Family Medicine

## 2020-09-06 DIAGNOSIS — M47812 Spondylosis without myelopathy or radiculopathy, cervical region: Secondary | ICD-10-CM | POA: Diagnosis not present

## 2020-09-06 DIAGNOSIS — M4802 Spinal stenosis, cervical region: Secondary | ICD-10-CM | POA: Diagnosis not present

## 2020-09-06 DIAGNOSIS — M129 Arthropathy, unspecified: Secondary | ICD-10-CM | POA: Diagnosis not present

## 2020-09-06 DIAGNOSIS — M5412 Radiculopathy, cervical region: Secondary | ICD-10-CM | POA: Diagnosis not present

## 2020-09-21 ENCOUNTER — Ambulatory Visit (INDEPENDENT_AMBULATORY_CARE_PROVIDER_SITE_OTHER): Payer: BC Managed Care – PPO | Admitting: Family Medicine

## 2020-09-21 ENCOUNTER — Other Ambulatory Visit: Payer: Self-pay

## 2020-09-21 ENCOUNTER — Encounter: Payer: Self-pay | Admitting: Family Medicine

## 2020-09-21 VITALS — BP 102/62 | HR 61 | Temp 98.0°F | Ht 71.0 in | Wt 163.5 lb

## 2020-09-21 DIAGNOSIS — Z125 Encounter for screening for malignant neoplasm of prostate: Secondary | ICD-10-CM

## 2020-09-21 DIAGNOSIS — Z0000 Encounter for general adult medical examination without abnormal findings: Secondary | ICD-10-CM | POA: Diagnosis not present

## 2020-09-21 DIAGNOSIS — E785 Hyperlipidemia, unspecified: Secondary | ICD-10-CM | POA: Diagnosis not present

## 2020-09-21 NOTE — Progress Notes (Signed)
Chief Complaint  Patient presents with  . Annual Exam    Well Male Craig Morales is here for a complete physical.   His last physical was >1 year ago.  Current diet: in general, diet could be better.  Current exercise: walking, stretching Weight trend: stable Fatigue out of ordinary? No. Seat belt? Yes.    Health maintenance Shingrix- No Colonoscopy- Yes Tetanus- Yes HIV- Yes Hep C- Yes   Past Medical History:  Diagnosis Date  . GERD (gastroesophageal reflux disease)   . HIV (human immunodeficiency virus infection) (HCC)   . Seasonal allergies       Past Surgical History:  Procedure Laterality Date  . KNEE SURGERY    . NASAL SEPTUM SURGERY      Medications  Current Outpatient Medications on File Prior to Visit  Medication Sig Dispense Refill  . Ascorbic Acid (VITAMIN C WITH ROSE HIPS) 500 MG tablet Take 500 mg by mouth daily.    . Azelastine HCl 0.15 % SOLN Place 2 sprays into the nose daily.    Marland Kitchen BIKTARVY 50-200-25 MG TABS tablet TAKE 1 TABLET BY MOUTH EVERY DAY 90 tablet 5  . etanercept (ENBREL) 50 MG/ML injection Inject 0.98 mLs (50 mg total) into the skin once a week. 0.98 mL   . fluticasone (FLONASE) 50 MCG/ACT nasal spray USE TWO SPRAY(S) IN EACH NOSTRIL ONCE DAILY 16 g 6  . folic acid (FOLVITE) 1 MG tablet Take 1 mg by mouth daily.    Marland Kitchen KRILL OIL OMEGA-3 PO Take 1 each by mouth daily.    . Melatonin 5 MG CAPS Take 1 each by mouth at bedtime.    . methotrexate 2.5 MG tablet Take 20 mg by mouth once a week.    . Misc Natural Products (OSTEO BI-FLEX ADV JOINT SHIELD) TABS Take 1 each by mouth daily.    . Multiple Vitamins-Minerals (CENTRUM SILVER ADULT 50+ PO) Take 1 each by mouth daily.    Marland Kitchen OVER THE COUNTER MEDICATION Systane Balance Opht-Apply 1 drop to each eye daily.    Marland Kitchen OVER THE COUNTER MEDICATION Zaditor Eye Drops-Apply 1 drop to each eye daily.    . pantoprazole (PROTONIX) 40 MG tablet Take 40 mg by mouth daily.    . pregabalin (LYRICA) 75 MG capsule  Take 75 mg by mouth daily. Reported on 05/29/2016    . valACYclovir (VALTREX) 500 MG tablet Take 1 tablet (500 mg total) by mouth 2 (two) times daily. Take 1 tablet (500 mg total) by mouth 2 (two) times daily as needed. 90 tablet 2   Allergies Allergies  Allergen Reactions  . Oxycontin [Oxycodone Hcl] Shortness Of Breath and Palpitations  . Hydrocodone     REACTION: SOB and chest pain  . Iodine Diarrhea and Nausea And Vomiting    In high doses.    Family History Family History  Problem Relation Age of Onset  . Bleeding Disorder Neg Hx     Review of Systems: Constitutional:  no fevers Eye:  no recent significant change in vision Ear/Nose/Mouth/Throat:  Ears:  no hearing loss Nose/Mouth/Throat:  no complaints of nasal congestion, no sore throat Cardiovascular:  no chest pain Respiratory:  no shortness of breath Gastrointestinal:  no change in bowel habits GU:  Male: negative for dysuria, frequency Musculoskeletal/Extremities:  +wrist and back pain Integumentary (Skin/Breast):  no abnormal skin lesions reported Neurologic:  no headaches Endocrine: No unexpected weight changes Hematologic/Lymphatic:  no abnormal bleeding  Exam BP 102/62 (BP Location: Left Arm,  Patient Position: Sitting, Cuff Size: Normal)   Pulse 61   Temp 98 F (36.7 C) (Oral)   Ht 5\' 11"  (1.803 m)   Wt 163 lb 8 oz (74.2 kg)   SpO2 98%   BMI 22.80 kg/m  General:  well developed, well nourished, in no apparent distress Skin:  no significant moles, warts, or growths Head:  no masses, lesions, or tenderness Eyes:  pupils equal and round, sclera anicteric without injection Ears:  canals without lesions, TMs shiny without retraction, no obvious effusion, no erythema Nose:  nares patent, septum midline, mucosa normal Throat/Pharynx:  lips and gingiva without lesion; tongue and uvula midline; non-inflamed pharynx; no exudates or postnasal drainage Neck: neck supple without adenopathy, thyromegaly, or  masses Cardiac: RRR, no bruits, no LE edema Lungs:  clear to auscultation, breath sounds equal bilaterally, no respiratory distress Rectal: Deferred Musculoskeletal:  symmetrical muscle groups noted without atrophy or deformity Neuro:  gait normal; deep tendon reflexes normal and symmetric Psych: well oriented with normal range of affect and appropriate judgment/insight  Assessment and Plan  Well adult exam - Plan: Lipid panel, Comprehensive metabolic panel  Screening for prostate cancer - Plan: PSA   Well 64 y.o. male. Counseled on diet and exercise. Counseled on risks and benefits of prostate cancer screening with PSA. The patient agrees to undergo testing. Immunizations, labs, and further orders as above. Follow up in 1 yr or prn. The patient voiced understanding and agreement to the plan.  [address] [place], DO 09/21/20 10:58 AM

## 2020-09-21 NOTE — Patient Instructions (Addendum)
Give Craig Morales 2-3 business days to get the results of your labs back.   Keep the diet clean and stay active.  Consider Voltaren (diclofenac) gel for your hands.   The new Shingrix vaccine (for shingles) is a 2 shot series. It can make people feel low energy, achy and almost like they have the flu for 48 hours after injection. Please plan accordingly when deciding on when to get this shot. Call our office for a nurse visit appointment to get this. The second shot of the series is less severe regarding the side effects, but it still lasts 48 hours.   Let Craig Morales know if you need anything.  EXERCISES  RANGE OF MOTION (ROM) AND STRETCHING EXERCISES - Low Back Pain Most people with lower back pain will find that their symptoms get worse with excessive bending forward (flexion) or arching at the lower back (extension). The exercises that will help resolve your symptoms will focus on the opposite motion.  If you have pain, numbness or tingling which travels down into your buttocks, leg or foot, the goal of the therapy is for these symptoms to move closer to your back and eventually resolve. Sometimes, these leg symptoms will get better, but your lower back pain may worsen. This is often an indication of progress in your rehabilitation. Be very alert to any changes in your symptoms and the activities in which you participated in the 24 hours prior to the change. Sharing this information with your caregiver will allow him or her to most efficiently treat your condition. These exercises may help you when beginning to rehabilitate your injury. Your symptoms may resolve with or without further involvement from your physician, physical therapist or athletic trainer. While completing these exercises, remember:   Restoring tissue flexibility helps normal motion to return to the joints. This allows healthier, less painful movement and activity.  An effective stretch should be held for at least 30 seconds.  A stretch should  never be painful. You should only feel a gentle lengthening or release in the stretched tissue. FLEXION RANGE OF MOTION AND STRETCHING EXERCISES:  STRETCH - Flexion, Single Knee to Chest   Lie on a firm bed or floor with both legs extended in front of you.  Keeping one leg in contact with the floor, bring your opposite knee to your chest. Hold your leg in place by either grabbing behind your thigh or at your knee.  Pull until you feel a gentle stretch in your low back. Hold 30 seconds.  Slowly release your grasp and repeat the exercise with the opposite side. Repeat 2 times. Complete this exercise 3 times per week.   STRETCH - Flexion, Double Knee to Chest  Lie on a firm bed or floor with both legs extended in front of you.  Keeping one leg in contact with the floor, bring your opposite knee to your chest.  Tense your stomach muscles to support your back and then lift your other knee to your chest. Hold your legs in place by either grabbing behind your thighs or at your knees.  Pull both knees toward your chest until you feel a gentle stretch in your low back. Hold 30 seconds.  Tense your stomach muscles and slowly return one leg at a time to the floor. Repeat 2 times. Complete this exercise 3 times per week.   STRETCH - Low Trunk Rotation  Lie on a firm bed or floor. Keeping your legs in front of you, bend your knees so  they are both pointed toward the ceiling and your feet are flat on the floor.  Extend your arms out to the side. This will stabilize your upper body by keeping your shoulders in contact with the floor.  Gently and slowly drop both knees together to one side until you feel a gentle stretch in your low back. Hold for 30 seconds.  Tense your stomach muscles to support your lower back as you bring your knees back to the starting position. Repeat the exercise to the other side. Repeat 2 times. Complete this exercise at least 3 times per week.   EXTENSION RANGE OF  MOTION AND FLEXIBILITY EXERCISES:  STRETCH - Extension, Prone on Elbows   Lie on your stomach on the floor, a bed will be too soft. Place your palms about shoulder width apart and at the height of your head.  Place your elbows under your shoulders. If this is too painful, stack pillows under your chest.  Allow your body to relax so that your hips drop lower and make contact more completely with the floor.  Hold this position for 30 seconds.  Slowly return to lying flat on the floor. Repeat 2 times. Complete this exercise 3 times per week.   RANGE OF MOTION - Extension, Prone Press Ups  Lie on your stomach on the floor, a bed will be too soft. Place your palms about shoulder width apart and at the height of your head.  Keeping your back as relaxed as possible, slowly straighten your elbows while keeping your hips on the floor. You may adjust the placement of your hands to maximize your comfort. As you gain motion, your hands will come more underneath your shoulders.  Hold this position 30 seconds.  Slowly return to lying flat on the floor. Repeat 2 times. Complete this exercise 3 times per week.   RANGE OF MOTION- Quadruped, Neutral Spine   Assume a hands and knees position on a firm surface. Keep your hands under your shoulders and your knees under your hips. You may place padding under your knees for comfort.  Drop your head and point your tailbone toward the ground below you. This will round out your lower back like an angry cat. Hold this position for 30 seconds.  Slowly lift your head and release your tail bone so that your back sags into a large arch, like an old horse.  Hold this position for 30 seconds.  Repeat this until you feel limber in your low back.  Now, find your "sweet spot." This will be the most comfortable position somewhere between the two previous positions. This is your neutral spine. Once you have found this position, tense your stomach muscles to support  your low back.  Hold this position for 30 seconds. Repeat 2 times. Complete this exercise 3 times per week.   STRENGTHENING EXERCISES - Low Back Sprain These exercises may help you when beginning to rehabilitate your injury. These exercises should be done near your "sweet spot." This is the neutral, low-back arch, somewhere between fully rounded and fully arched, that is your least painful position. When performed in this safe range of motion, these exercises can be used for people who have either a flexion or extension based injury. These exercises may resolve your symptoms with or without further involvement from your physician, physical therapist or athletic trainer. While completing these exercises, remember:   Muscles can gain both the endurance and the strength needed for everyday activities through controlled exercises.  Complete these exercises as instructed by your physician, physical therapist or athletic trainer. Increase the resistance and repetitions only as guided.  You may experience muscle soreness or fatigue, but the pain or discomfort you are trying to eliminate should never worsen during these exercises. If this pain does worsen, stop and make certain you are following the directions exactly. If the pain is still present after adjustments, discontinue the exercise until you can discuss the trouble with your caregiver.  STRENGTHENING - Deep Abdominals, Pelvic Tilt   Lie on a firm bed or floor. Keeping your legs in front of you, bend your knees so they are both pointed toward the ceiling and your feet are flat on the floor.  Tense your lower abdominal muscles to press your low back into the floor. This motion will rotate your pelvis so that your tail bone is scooping upwards rather than pointing at your feet or into the floor. With a gentle tension and even breathing, hold this position for 3 seconds. Repeat 2 times. Complete this exercise 3 times per week.   STRENGTHENING -  Abdominals, Crunches   Lie on a firm bed or floor. Keeping your legs in front of you, bend your knees so they are both pointed toward the ceiling and your feet are flat on the floor. Cross your arms over your chest.  Slightly tip your chin down without bending your neck.  Tense your abdominals and slowly lift your trunk high enough to just clear your shoulder blades. Lifting higher can put excessive stress on the lower back and does not further strengthen your abdominal muscles.  Control your return to the starting position. Repeat 2 times. Complete this exercise 3 times per week.   STRENGTHENING - Quadruped, Opposite UE/LE Lift   Assume a hands and knees position on a firm surface. Keep your hands under your shoulders and your knees under your hips. You may place padding under your knees for comfort.  Find your neutral spine and gently tense your abdominal muscles so that you can maintain this position. Your shoulders and hips should form a rectangle that is parallel with the floor and is not twisted.  Keeping your trunk steady, lift your right hand no higher than your shoulder and then your left leg no higher than your hip. Make sure you are not holding your breath. Hold this position for 30 seconds.  Continuing to keep your abdominal muscles tense and your back steady, slowly return to your starting position. Repeat with the opposite arm and leg. Repeat 2 times. Complete this exercise 3 times per week.   STRENGTHENING - Abdominals and Quadriceps, Straight Leg Raise   Lie on a firm bed or floor with both legs extended in front of you.  Keeping one leg in contact with the floor, bend the other knee so that your foot can rest flat on the floor.  Find your neutral spine, and tense your abdominal muscles to maintain your spinal position throughout the exercise.  Slowly lift your straight leg off the floor about 6 inches for a count of 3, making sure to not hold your breath.  Still  keeping your neutral spine, slowly lower your leg all the way to the floor. Repeat this exercise with each leg 2 times. Complete this exercise 3 times per week.  POSTURE AND BODY MECHANICS CONSIDERATIONS - Low Back Sprain Keeping correct posture when sitting, standing or completing your activities will reduce the stress put on different body tissues, allowing injured tissues  a chance to heal and limiting painful experiences. The following are general guidelines for improved posture.  While reading these guidelines, remember:  The exercises prescribed by your provider will help you have the flexibility and strength to maintain correct postures.  The correct posture provides the best environment for your joints to work. All of your joints have less wear and tear when properly supported by a spine with good posture. This means you will experience a healthier, less painful body.  Correct posture must be practiced with all of your activities, especially prolonged sitting and standing. Correct posture is as important when doing repetitive low-stress activities (typing) as it is when doing a single heavy-load activity (lifting).  RESTING POSITIONS Consider which positions are most painful for you when choosing a resting position. If you have pain with flexion-based activities (sitting, bending, stooping, squatting), choose a position that allows you to rest in a less flexed posture. You would want to avoid curling into a fetal position on your side. If your pain worsens with extension-based activities (prolonged standing, working overhead), avoid resting in an extended position such as sleeping on your stomach. Most people will find more comfort when they rest with their spine in a more neutral position, neither too rounded nor too arched. Lying on a non-sagging bed on your side with a pillow between your knees, or on your back with a pillow under your knees will often provide some relief. Keep in mind, being  in any one position for a prolonged period of time, no matter how correct your posture, can still lead to stiffness.  PROPER SITTING POSTURE In order to minimize stress and discomfort on your spine, you must sit with correct posture. Sitting with good posture should be effortless for a healthy body. Returning to good posture is a gradual process. Many people can work toward this most comfortably by using various supports until they have the flexibility and strength to maintain this posture on their own. When sitting with proper posture, your ears will fall over your shoulders and your shoulders will fall over your hips. You should use the back of the chair to support your upper back. Your lower back will be in a neutral position, just slightly arched. You may place a small pillow or folded towel at the base of your lower back for  support.  When working at a desk, create an environment that supports good, upright posture. Without extra support, muscles tire, which leads to excessive strain on joints and other tissues. Keep these recommendations in mind:  CHAIR:  A chair should be able to slide under your desk when your back makes contact with the back of the chair. This allows you to work closely.  The chair's height should allow your eyes to be level with the upper part of your monitor and your hands to be slightly lower than your elbows.  BODY POSITION  Your feet should make contact with the floor. If this is not possible, use a foot rest.  Keep your ears over your shoulders. This will reduce stress on your neck and low back.  INCORRECT SITTING POSTURES  If you are feeling tired and unable to assume a healthy sitting posture, do not slouch or slump. This puts excessive strain on your back tissues, causing more damage and pain. Healthier options include:  Using more support, like a lumbar pillow.  Switching tasks to something that requires you to be upright or walking.  Talking a brief  walk.  Lying  down to rest in a neutral-spine position.  PROLONGED STANDING WHILE SLIGHTLY LEANING FORWARD  When completing a task that requires you to lean forward while standing in one place for a long time, place either foot up on a stationary 2-4 inch high object to help maintain the best posture. When both feet are on the ground, the lower back tends to lose its slight inward curve. If this curve flattens (or becomes too large), then the back and your other joints will experience too much stress, tire more quickly, and can cause pain.  CORRECT STANDING POSTURES Proper standing posture should be assumed with all daily activities, even if they only take a few moments, like when brushing your teeth. As in sitting, your ears should fall over your shoulders and your shoulders should fall over your hips. You should keep a slight tension in your abdominal muscles to brace your spine. Your tailbone should point down to the ground, not behind your body, resulting in an over-extended swayback posture.   INCORRECT STANDING POSTURES  Common incorrect standing postures include a forward head, locked knees and/or an excessive swayback. WALKING Walk with an upright posture. Your ears, shoulders and hips should all line-up.  PROLONGED ACTIVITY IN A FLEXED POSITION When completing a task that requires you to bend forward at your waist or lean over a low surface, try to find a way to stabilize 3 out of 4 of your limbs. You can place a hand or elbow on your thigh or rest a knee on the surface you are reaching across. This will provide you more stability, so that your muscles do not tire as quickly. By keeping your knees relaxed, or slightly bent, you will also reduce stress across your lower back. CORRECT LIFTING TECHNIQUES  DO :  Assume a wide stance. This will provide you more stability and the opportunity to get as close as possible to the object which you are lifting.  Tense your abdominals to brace your  spine. Bend at the knees and hips. Keeping your back locked in a neutral-spine position, lift using your leg muscles. Lift with your legs, keeping your back straight.  Test the weight of unknown objects before attempting to lift them.  Try to keep your elbows locked down at your sides in order get the best strength from your shoulders when carrying an object.     Always ask for help when lifting heavy or awkward objects. INCORRECT LIFTING TECHNIQUES DO NOT:   Lock your knees when lifting, even if it is a small object.  Bend and twist. Pivot at your feet or move your feet when needing to change directions.  Assume that you can safely pick up even a paperclip without proper posture.

## 2020-09-22 LAB — COMPREHENSIVE METABOLIC PANEL
AG Ratio: 1.7 (calc) (ref 1.0–2.5)
ALT: 39 U/L (ref 9–46)
AST: 35 U/L (ref 10–35)
Albumin: 4.3 g/dL (ref 3.6–5.1)
Alkaline phosphatase (APISO): 61 U/L (ref 35–144)
BUN: 14 mg/dL (ref 7–25)
CO2: 29 mmol/L (ref 20–32)
Calcium: 9.6 mg/dL (ref 8.6–10.3)
Chloride: 106 mmol/L (ref 98–110)
Creat: 0.95 mg/dL (ref 0.70–1.25)
Globulin: 2.6 g/dL (calc) (ref 1.9–3.7)
Glucose, Bld: 82 mg/dL (ref 65–99)
Potassium: 4.3 mmol/L (ref 3.5–5.3)
Sodium: 142 mmol/L (ref 135–146)
Total Bilirubin: 0.9 mg/dL (ref 0.2–1.2)
Total Protein: 6.9 g/dL (ref 6.1–8.1)

## 2020-09-22 LAB — LIPID PANEL
Cholesterol: 153 mg/dL (ref <200)
HDL: 59 mg/dL (ref >=40)
LDL Cholesterol (Calc): 73 mg/dL (calc)
Non-HDL Cholesterol (Calc): 94 mg/dL (calc) (ref <130)
Total CHOL/HDL Ratio: 2.6 (calc) (ref <5.0)
Triglycerides: 125 mg/dL (ref <150)

## 2020-09-22 LAB — PSA: PSA: 1.75 ng/mL (ref <=4.0)

## 2020-09-24 ENCOUNTER — Other Ambulatory Visit: Payer: Self-pay | Admitting: Family Medicine

## 2020-09-24 DIAGNOSIS — R972 Elevated prostate specific antigen [PSA]: Secondary | ICD-10-CM

## 2020-10-04 DIAGNOSIS — Z86010 Personal history of colonic polyps: Secondary | ICD-10-CM | POA: Diagnosis not present

## 2020-10-04 DIAGNOSIS — K219 Gastro-esophageal reflux disease without esophagitis: Secondary | ICD-10-CM | POA: Diagnosis not present

## 2020-10-23 DIAGNOSIS — M069 Rheumatoid arthritis, unspecified: Secondary | ICD-10-CM | POA: Diagnosis not present

## 2020-10-23 DIAGNOSIS — M1180 Other specified crystal arthropathies, unspecified site: Secondary | ICD-10-CM | POA: Diagnosis not present

## 2020-10-23 DIAGNOSIS — Z79899 Other long term (current) drug therapy: Secondary | ICD-10-CM | POA: Diagnosis not present

## 2020-10-23 DIAGNOSIS — M1990 Unspecified osteoarthritis, unspecified site: Secondary | ICD-10-CM | POA: Diagnosis not present

## 2020-10-25 DIAGNOSIS — M25431 Effusion, right wrist: Secondary | ICD-10-CM | POA: Diagnosis not present

## 2020-10-29 ENCOUNTER — Other Ambulatory Visit: Payer: Self-pay | Admitting: Family Medicine

## 2020-10-29 DIAGNOSIS — R972 Elevated prostate specific antigen [PSA]: Secondary | ICD-10-CM

## 2020-10-29 DIAGNOSIS — M5412 Radiculopathy, cervical region: Secondary | ICD-10-CM | POA: Diagnosis not present

## 2020-11-13 ENCOUNTER — Other Ambulatory Visit: Payer: Self-pay

## 2020-11-13 DIAGNOSIS — B20 Human immunodeficiency virus [HIV] disease: Secondary | ICD-10-CM

## 2020-11-13 MED ORDER — BIKTARVY 50-200-25 MG PO TABS: 1.0000 | ORAL_TABLET | Freq: Every day | ORAL | 0 refills | Status: DC

## 2020-11-15 ENCOUNTER — Other Ambulatory Visit: Payer: BC Managed Care – PPO

## 2020-11-29 ENCOUNTER — Encounter: Payer: BC Managed Care – PPO | Admitting: Internal Medicine

## 2020-12-04 ENCOUNTER — Other Ambulatory Visit: Payer: Self-pay

## 2020-12-04 ENCOUNTER — Other Ambulatory Visit (HOSPITAL_COMMUNITY)
Admission: RE | Admit: 2020-12-04 | Discharge: 2020-12-04 | Disposition: A | Discharge status: Home / Self Care | Payer: BC Managed Care – PPO | Source: Ambulatory Visit | Attending: Internal Medicine | Admitting: Internal Medicine

## 2020-12-04 ENCOUNTER — Other Ambulatory Visit: Payer: BC Managed Care – PPO

## 2020-12-04 DIAGNOSIS — Z79899 Other long term (current) drug therapy: Secondary | ICD-10-CM

## 2020-12-04 DIAGNOSIS — Z113 Encounter for screening for infections with a predominantly sexual mode of transmission: Secondary | ICD-10-CM | POA: Diagnosis not present

## 2020-12-04 DIAGNOSIS — B20 Human immunodeficiency virus [HIV] disease: Secondary | ICD-10-CM

## 2020-12-05 DIAGNOSIS — M5412 Radiculopathy, cervical region: Secondary | ICD-10-CM | POA: Diagnosis not present

## 2020-12-05 DIAGNOSIS — M542 Cervicalgia: Secondary | ICD-10-CM | POA: Diagnosis not present

## 2020-12-05 DIAGNOSIS — M4802 Spinal stenosis, cervical region: Secondary | ICD-10-CM | POA: Diagnosis not present

## 2020-12-05 LAB — T-HELPER CELL (CD4) - (RCID CLINIC ONLY)
CD4 % Helper T Cell: 41 % (ref 33–65)
CD4 T Cell Abs: 669 /uL (ref 400–1790)

## 2020-12-06 ENCOUNTER — Other Ambulatory Visit (HOSPITAL_BASED_OUTPATIENT_CLINIC_OR_DEPARTMENT_OTHER): Payer: Self-pay | Admitting: Neurosurgery

## 2020-12-06 DIAGNOSIS — M4802 Spinal stenosis, cervical region: Secondary | ICD-10-CM

## 2020-12-06 LAB — URINE CYTOLOGY ANCILLARY ONLY
Chlamydia: NEGATIVE
Comment: NEGATIVE
Comment: NORMAL
Neisseria Gonorrhea: NEGATIVE

## 2020-12-14 LAB — COMPLETE METABOLIC PANEL WITH GFR
AG Ratio: 1.7 (calc) (ref 1.0–2.5)
ALT: 31 U/L (ref 9–46)
AST: 34 U/L (ref 10–35)
Albumin: 4.3 g/dL (ref 3.6–5.1)
Alkaline phosphatase (APISO): 70 U/L (ref 35–144)
BUN: 16 mg/dL (ref 7–25)
CO2: 30 mmol/L (ref 20–32)
Calcium: 9.3 mg/dL (ref 8.6–10.3)
Chloride: 104 mmol/L (ref 98–110)
Creat: 1.06 mg/dL (ref 0.70–1.25)
GFR, Est African American: 86 mL/min/{1.73_m2} (ref >=60)
GFR, Est Non African American: 74 mL/min/{1.73_m2} (ref >=60)
Globulin: 2.5 g/dL (calc) (ref 1.9–3.7)
Glucose, Bld: 119 mg/dL — ABNORMAL HIGH (ref 65–99)
Potassium: 4.2 mmol/L (ref 3.5–5.3)
Sodium: 140 mmol/L (ref 135–146)
Total Bilirubin: 0.5 mg/dL (ref 0.2–1.2)
Total Protein: 6.8 g/dL (ref 6.1–8.1)

## 2020-12-14 LAB — CBC WITH DIFFERENTIAL/PLATELET
Absolute Monocytes: 636 cells/uL (ref 200–950)
Basophils Absolute: 21 cells/uL (ref 0–200)
Basophils Relative: 0.4 %
Eosinophils Absolute: 133 cells/uL (ref 15–500)
Eosinophils Relative: 2.5 %
HCT: 41.9 % (ref 38.5–50.0)
Hemoglobin: 14.5 g/dL (ref 13.2–17.1)
Lymphs Abs: 1601 cells/uL (ref 850–3900)
MCH: 32.4 pg (ref 27.0–33.0)
MCHC: 34.6 g/dL (ref 32.0–36.0)
MCV: 93.5 fL (ref 80.0–100.0)
MPV: 9.8 fL (ref 7.5–12.5)
Monocytes Relative: 12 %
Neutro Abs: 2910 cells/uL (ref 1500–7800)
Neutrophils Relative %: 54.9 %
Platelets: 243 10*3/uL (ref 140–400)
RBC: 4.48 10*6/uL (ref 4.20–5.80)
RDW: 14.3 % (ref 11.0–15.0)
Total Lymphocyte: 30.2 %
WBC: 5.3 10*3/uL (ref 3.8–10.8)

## 2020-12-14 LAB — LIPID PANEL
Cholesterol: 181 mg/dL (ref <200)
HDL: 58 mg/dL (ref >=40)
LDL Cholesterol (Calc): 102 mg/dL (calc) — ABNORMAL HIGH
Non-HDL Cholesterol (Calc): 123 mg/dL (calc) (ref <130)
Total CHOL/HDL Ratio: 3.1 (calc) (ref <5.0)
Triglycerides: 110 mg/dL (ref <150)

## 2020-12-14 LAB — HIV-1 RNA QUANT-NO REFLEX-BLD
HIV 1 RNA Quant: <20 Copies/mL — ABNORMAL HIGH
HIV-1 RNA Quant, Log: <1.3 Log cps/mL — ABNORMAL HIGH

## 2020-12-14 LAB — RPR: RPR Ser Ql: NONREACTIVE

## 2020-12-15 ENCOUNTER — Other Ambulatory Visit: Payer: Self-pay

## 2020-12-15 ENCOUNTER — Ambulatory Visit (HOSPITAL_BASED_OUTPATIENT_CLINIC_OR_DEPARTMENT_OTHER)
Admission: RE | Admit: 2020-12-15 | Discharge: 2020-12-15 | Disposition: A | Discharge status: Home / Self Care | Payer: BC Managed Care – PPO | Source: Ambulatory Visit | Attending: Neurosurgery | Admitting: Neurosurgery

## 2020-12-15 DIAGNOSIS — M542 Cervicalgia: Secondary | ICD-10-CM | POA: Diagnosis not present

## 2020-12-15 DIAGNOSIS — M4802 Spinal stenosis, cervical region: Secondary | ICD-10-CM | POA: Diagnosis not present

## 2020-12-18 ENCOUNTER — Other Ambulatory Visit: Payer: Self-pay

## 2020-12-18 ENCOUNTER — Ambulatory Visit (INDEPENDENT_AMBULATORY_CARE_PROVIDER_SITE_OTHER): Payer: BC Managed Care – PPO | Admitting: Internal Medicine

## 2020-12-18 DIAGNOSIS — B20 Human immunodeficiency virus [HIV] disease: Secondary | ICD-10-CM | POA: Diagnosis not present

## 2020-12-18 MED ORDER — BIKTARVY 50-200-25 MG PO TABS
1.0000 | ORAL_TABLET | Freq: Every day | ORAL | 11 refills | Status: DC
Start: 2020-12-18 — End: 2021-05-13

## 2020-12-18 NOTE — Progress Notes (Signed)
Patient Active Problem List   Diagnosis Date Noted  . Human immunodeficiency virus (HIV) disease (HCC) 02/12/2007    Priority: High  . Rheumatoid arthritis (HCC) 04/14/2016    Priority: Medium  . History of calcium pyrophosphate deposition disease (CPPD) 07/25/2013    Priority: Medium  . Visit for preventive health examination 10/07/2013  . Gout 10/07/2013  . Degenerative arthritis of spine 11/27/2011  . EPISTAXIS, RECURRENT 11/26/2010  . IRRITABLE BOWEL SYNDROME 05/23/2008  . HERPES LABIALIS 02/23/2007  . GERD 02/23/2007  . HERPES ZOSTER, UNCOMPLICATED 02/12/2007  . DEVIATED NASAL SEPTUM 02/12/2007  . ALLERGIC RHINITIS 02/12/2007  . PSORIASIS 02/12/2007  . DIARRHEA 02/12/2007  . KNEE INJURY 02/12/2007    Patient's Medications  New Prescriptions   No medications on file  Previous Medications   ASCORBIC ACID (VITAMIN C WITH ROSE HIPS) 500 MG TABLET    Take 500 mg by mouth daily.   AZELASTINE HCL 0.15 % SOLN    Place 2 sprays into the nose daily.   ETANERCEPT (ENBREL) 50 MG/ML INJECTION    Inject 0.98 mLs (50 mg total) into the skin once a week.   FLUTICASONE (FLONASE) 50 MCG/ACT NASAL SPRAY    USE TWO SPRAY(S) IN EACH NOSTRIL ONCE DAILY   FOLIC ACID (FOLVITE) 1 MG TABLET    Take 1 mg by mouth daily.   KRILL OIL OMEGA-3 PO    Take 1 each by mouth daily.   MELATONIN 5 MG CAPS    Take 1 each by mouth at bedtime.   METHOTREXATE 2.5 MG TABLET    Take 20 mg by mouth once a week.   MISC NATURAL PRODUCTS (OSTEO BI-FLEX ADV JOINT SHIELD) TABS    Take 1 each by mouth daily.   MULTIPLE VITAMINS-MINERALS (CENTRUM SILVER ADULT 50+ PO)    Take 1 each by mouth daily.   OVER THE COUNTER MEDICATION    Systane Balance Opht-Apply 1 drop to each eye daily.   OVER THE COUNTER MEDICATION    Zaditor Eye Drops-Apply 1 drop to each eye daily.   PANTOPRAZOLE (PROTONIX) 40 MG TABLET    Take 40 mg by mouth daily.   PREGABALIN (LYRICA) 75 MG CAPSULE    Take 75 mg by mouth daily. Reported on  05/29/2016   VALACYCLOVIR (VALTREX) 500 MG TABLET    Take 1 tablet (500 mg total) by mouth 2 (two) times daily. Take 1 tablet (500 mg total) by mouth 2 (two) times daily as needed.  Modified Medications   Modified Medication Previous Medication   BICTEGRAVIR-EMTRICITABINE-TENOFOVIR AF (BIKTARVY) 50-200-25 MG TABS TABLET bictegravir-emtricitabine-tenofovir AF (BIKTARVY) 50-200-25 MG TABS tablet      Take 1 tablet by mouth daily.    Take 1 tablet by mouth daily.  Discontinued Medications   No medications on file    Subjective: Craig Morales is in for his routine HIV follow-up visit. He denies any problems obtaining, taking or tolerating his Biktarvy and does not recall missing doses. He is not on any new medications since his last visit. He has completed his COVID booster vaccine and his annual influenza vaccine. He is still working from home during the COVID pandemic. He is still caring for his elderly dog who has diabetes. His rheumatoid arthritis is under reasonably good control. He is having significant problems with degenerative cervical spine disease and is having intermittent steroid injections.  Review of Systems: Review of Systems  Constitutional: Negative for weight loss.  Musculoskeletal: Positive  for joint pain and neck pain.  Psychiatric/Behavioral: Negative for depression. The patient is not nervous/anxious.     Past Medical History:  Diagnosis Date  . GERD (gastroesophageal reflux disease)   . HIV (human immunodeficiency virus infection) (HCC)   . Seasonal allergies     Social History   Tobacco Use  . Smoking status: Never Smoker  . Smokeless tobacco: Never Used  Substance Use Topics  . Alcohol use: Yes    Alcohol/week: 7.0 standard drinks    Types: 7 Standard drinks or equivalent per week    Comment: occasional  . Drug use: No    Family History  Problem Relation Age of Onset  . Bleeding Disorder Neg Hx     Allergies  Allergen Reactions  . Oxycontin [Oxycodone Hcl]  Shortness Of Breath and Palpitations  . Hydrocodone     REACTION: SOB and chest pain  . Iodine Diarrhea and Nausea And Vomiting    In high doses.    Health Maintenance  Topic Date Due  . COVID-19 Vaccine (4 - Booster for Pfizer series) 01/23/2021  . TETANUS/TDAP  10/08/2023  . COLONOSCOPY (Pts 45-24yrs Insurance coverage will need to be confirmed)  04/07/2027  . INFLUENZA VACCINE  Completed  . Hepatitis C Screening  Completed  . HIV Screening  Completed    Objective:  Vitals:   12/18/20 1633  Weight: 161 lb (73 kg)   Body mass index is 22.45 kg/m.  Physical Exam Constitutional:      Comments: His spirits are good as usual.  Cardiovascular:     Rate and Rhythm: Normal rate.  Pulmonary:     Effort: Pulmonary effort is normal.  Musculoskeletal:     Comments: He has chronic joint changes of rheumatoid arthritis but no acute inflammation.  Psychiatric:        Mood and Affect: Mood normal.     Lab Results Lab Results  Component Value Date   WBC 5.3 12/04/2020   HGB 14.5 12/04/2020   HCT 41.9 12/04/2020   MCV 93.5 12/04/2020   PLT 243 12/04/2020    Lab Results  Component Value Date   CREATININE 1.06 12/04/2020   BUN 16 12/04/2020   NA 140 12/04/2020   K 4.2 12/04/2020   CL 104 12/04/2020   CO2 30 12/04/2020    Lab Results  Component Value Date   ALT 31 12/04/2020   AST 34 12/04/2020   ALKPHOS 76 07/22/2019   BILITOT 0.5 12/04/2020    Lab Results  Component Value Date   CHOL 181 12/04/2020   HDL 58 12/04/2020   LDLCALC 102 (H) 12/04/2020   TRIG 110 12/04/2020   CHOLHDL 3.1 12/04/2020   Lab Results  Component Value Date   LABRPR NON-REACTIVE 12/04/2020   HIV 1 RNA Quant  Date Value  12/04/2020 <20 Copies/mL (H)  11/17/2019 26 copies/mL (H)  11/15/2018 <20 NOT DETECTED copies/mL   CD4 T Cell Abs (/uL)  Date Value  12/04/2020 669  11/17/2019 768  11/15/2018 620     Problem List Items Addressed This Visit      High   Human  immunodeficiency virus (HIV) disease (HCC)    His infection remains under excellent, long-term control. He will continue Biktarvy and follow-up after lab work in 1 year.      Relevant Medications   bictegravir-emtricitabine-tenofovir AF (BIKTARVY) 50-200-25 MG TABS tablet   Other Relevant Orders   CBC   T-helper cell (CD4)- ([facility] clinic only)   Comprehensive metabolic  panel   Lipid panel   RPR   HIV-1 RNA quant-no reflex-bld        Cliffton Asters, MD [facility] [phone] pager   [phone] cell 12/18/2020, 5:01 PM

## 2020-12-18 NOTE — Assessment & Plan Note (Signed)
His infection remains under excellent, long-term control.  He will continue Biktarvy and follow-up after lab work in 1 year. 

## 2020-12-28 ENCOUNTER — Other Ambulatory Visit: Payer: BC Managed Care – PPO

## 2021-01-29 DIAGNOSIS — M1180 Other specified crystal arthropathies, unspecified site: Secondary | ICD-10-CM | POA: Diagnosis not present

## 2021-01-29 DIAGNOSIS — Z79899 Other long term (current) drug therapy: Secondary | ICD-10-CM | POA: Diagnosis not present

## 2021-01-29 DIAGNOSIS — M25539 Pain in unspecified wrist: Secondary | ICD-10-CM | POA: Diagnosis not present

## 2021-01-29 DIAGNOSIS — M25431 Effusion, right wrist: Secondary | ICD-10-CM | POA: Diagnosis not present

## 2021-02-01 DIAGNOSIS — M4802 Spinal stenosis, cervical region: Secondary | ICD-10-CM | POA: Diagnosis not present

## 2021-02-01 DIAGNOSIS — M5412 Radiculopathy, cervical region: Secondary | ICD-10-CM | POA: Diagnosis not present

## 2021-02-18 DIAGNOSIS — M5412 Radiculopathy, cervical region: Secondary | ICD-10-CM | POA: Diagnosis not present

## 2021-02-21 DIAGNOSIS — Z79899 Other long term (current) drug therapy: Secondary | ICD-10-CM | POA: Diagnosis not present

## 2021-02-21 DIAGNOSIS — M069 Rheumatoid arthritis, unspecified: Secondary | ICD-10-CM | POA: Diagnosis not present

## 2021-03-21 DIAGNOSIS — M5412 Radiculopathy, cervical region: Secondary | ICD-10-CM | POA: Diagnosis not present

## 2021-03-21 DIAGNOSIS — M4802 Spinal stenosis, cervical region: Secondary | ICD-10-CM | POA: Diagnosis not present

## 2021-05-13 ENCOUNTER — Other Ambulatory Visit: Payer: Self-pay | Admitting: Internal Medicine

## 2021-05-13 DIAGNOSIS — B20 Human immunodeficiency virus [HIV] disease: Secondary | ICD-10-CM

## 2021-05-16 DIAGNOSIS — M5412 Radiculopathy, cervical region: Secondary | ICD-10-CM | POA: Diagnosis not present

## 2021-06-13 DIAGNOSIS — M4802 Spinal stenosis, cervical region: Secondary | ICD-10-CM | POA: Diagnosis not present

## 2021-06-13 DIAGNOSIS — M47812 Spondylosis without myelopathy or radiculopathy, cervical region: Secondary | ICD-10-CM | POA: Diagnosis not present

## 2021-06-13 DIAGNOSIS — M5412 Radiculopathy, cervical region: Secondary | ICD-10-CM | POA: Diagnosis not present

## 2021-06-17 DIAGNOSIS — Z79899 Other long term (current) drug therapy: Secondary | ICD-10-CM | POA: Diagnosis not present

## 2021-06-17 DIAGNOSIS — M1180 Other specified crystal arthropathies, unspecified site: Secondary | ICD-10-CM | POA: Diagnosis not present

## 2021-06-17 DIAGNOSIS — M25431 Effusion, right wrist: Secondary | ICD-10-CM | POA: Diagnosis not present

## 2021-06-17 DIAGNOSIS — M25531 Pain in right wrist: Secondary | ICD-10-CM | POA: Diagnosis not present

## 2021-07-15 DIAGNOSIS — H524 Presbyopia: Secondary | ICD-10-CM | POA: Diagnosis not present

## 2021-07-15 DIAGNOSIS — Z79899 Other long term (current) drug therapy: Secondary | ICD-10-CM | POA: Diagnosis not present

## 2021-07-15 DIAGNOSIS — H353131 Nonexudative age-related macular degeneration, bilateral, early dry stage: Secondary | ICD-10-CM | POA: Diagnosis not present

## 2021-07-15 DIAGNOSIS — D3131 Benign neoplasm of right choroid: Secondary | ICD-10-CM | POA: Diagnosis not present

## 2021-07-15 DIAGNOSIS — H2513 Age-related nuclear cataract, bilateral: Secondary | ICD-10-CM | POA: Diagnosis not present

## 2021-07-15 DIAGNOSIS — H52223 Regular astigmatism, bilateral: Secondary | ICD-10-CM | POA: Diagnosis not present

## 2021-08-19 DIAGNOSIS — M5412 Radiculopathy, cervical region: Secondary | ICD-10-CM | POA: Diagnosis not present

## 2021-09-10 ENCOUNTER — Other Ambulatory Visit: Payer: Self-pay | Admitting: Internal Medicine

## 2021-09-10 DIAGNOSIS — B20 Human immunodeficiency virus [HIV] disease: Secondary | ICD-10-CM

## 2021-09-17 DIAGNOSIS — M47812 Spondylosis without myelopathy or radiculopathy, cervical region: Secondary | ICD-10-CM | POA: Diagnosis not present

## 2021-09-17 DIAGNOSIS — M4802 Spinal stenosis, cervical region: Secondary | ICD-10-CM | POA: Diagnosis not present

## 2021-09-17 DIAGNOSIS — M129 Arthropathy, unspecified: Secondary | ICD-10-CM | POA: Diagnosis not present

## 2021-09-26 ENCOUNTER — Other Ambulatory Visit: Payer: Self-pay

## 2021-09-27 ENCOUNTER — Encounter: Payer: Self-pay | Admitting: Family Medicine

## 2021-09-27 ENCOUNTER — Ambulatory Visit (INDEPENDENT_AMBULATORY_CARE_PROVIDER_SITE_OTHER): Payer: BC Managed Care – PPO | Admitting: Family Medicine

## 2021-09-27 VITALS — BP 110/70 | HR 57 | Temp 97.6°F | Resp 18 | Ht 69.0 in | Wt 158.6 lb

## 2021-09-27 DIAGNOSIS — L603 Nail dystrophy: Secondary | ICD-10-CM

## 2021-09-27 DIAGNOSIS — Z0000 Encounter for general adult medical examination without abnormal findings: Secondary | ICD-10-CM | POA: Diagnosis not present

## 2021-09-27 DIAGNOSIS — Z125 Encounter for screening for malignant neoplasm of prostate: Secondary | ICD-10-CM | POA: Diagnosis not present

## 2021-09-27 LAB — COMPREHENSIVE METABOLIC PANEL
ALT: 21 U/L (ref 0–53)
AST: 24 U/L (ref 0–37)
Albumin: 4.4 g/dL (ref 3.5–5.2)
Alkaline Phosphatase: 50 U/L (ref 39–117)
BUN: 16 mg/dL (ref 6–23)
CO2: 31 mEq/L (ref 19–32)
Calcium: 9.4 mg/dL (ref 8.4–10.5)
Chloride: 102 mEq/L (ref 96–112)
Creatinine, Ser: 1.01 mg/dL (ref 0.40–1.50)
GFR: 78 mL/min (ref >60.00)
Glucose, Bld: 77 mg/dL (ref 70–99)
Potassium: 3.8 mEq/L (ref 3.5–5.1)
Sodium: 139 mEq/L (ref 135–145)
Total Bilirubin: 1 mg/dL (ref 0.2–1.2)
Total Protein: 6.9 g/dL (ref 6.0–8.3)

## 2021-09-27 LAB — CBC
HCT: 43 % (ref 39.0–52.0)
Hemoglobin: 14.3 g/dL (ref 13.0–17.0)
MCHC: 33.2 g/dL (ref 30.0–36.0)
MCV: 97.1 fl (ref 78.0–100.0)
Platelets: 224 10*3/uL (ref 150.0–400.0)
RBC: 4.43 Mil/uL (ref 4.22–5.81)
RDW: 15.7 % — ABNORMAL HIGH (ref 11.5–15.5)
WBC: 6.1 10*3/uL (ref 4.0–10.5)

## 2021-09-27 LAB — LIPID PANEL
Cholesterol: 155 mg/dL (ref 0–200)
HDL: 66.2 mg/dL (ref >39.00)
LDL Cholesterol: 72 mg/dL (ref 0–99)
NonHDL: 89.29
Total CHOL/HDL Ratio: 2
Triglycerides: 84 mg/dL (ref 0.0–149.0)
VLDL: 16.8 mg/dL (ref 0.0–40.0)

## 2021-09-27 LAB — PSA: PSA: 1.13 ng/mL (ref 0.10–4.00)

## 2021-09-27 NOTE — Progress Notes (Signed)
Chief Complaint  Patient presents with   Annual Exam    Concerns/ questions: pt says his belly button has been protruding Flu shot today: received about 3 weeks ago along with PNEUMONIA  Zoster: yes- a while a go- per pt    Well Male Craig Morales is here for a complete physical.   His last physical was >1 year ago.  Current diet: in general, a "healthy" diet.   Current exercise: stretching, wt resistance  Weight trend: stable Fatigue out of ordinary? No. Seat belt? Yes.    Health maintenance Shingrix- No Colonoscopy- Yes Tetanus- Yes Hep C- Yes Pneumonia vaccine- Yes  Past Medical History:  Diagnosis Date   GERD (gastroesophageal reflux disease)    HIV (human immunodeficiency virus infection) (HCC)    Seasonal allergies      Past Surgical History:  Procedure Laterality Date   KNEE SURGERY     NASAL SEPTUM SURGERY      Medications  Current Outpatient Medications on File Prior to Visit  Medication Sig Dispense Refill   Ascorbic Acid (VITAMIN C WITH ROSE HIPS) 500 MG tablet Take 500 mg by mouth daily.     Azelastine HCl 0.15 % SOLN Place 2 sprays into the nose daily.     BIKTARVY 50-200-25 MG TABS tablet TAKE 1 TABLET BY MOUTH EVERY DAY 90 tablet 1   etanercept (ENBREL) 50 MG/ML injection Inject 0.98 mLs (50 mg total) into the skin once a week. 0.98 mL    fluticasone (FLONASE) 50 MCG/ACT nasal spray USE TWO SPRAY(S) IN EACH NOSTRIL ONCE DAILY 16 g 6   folic acid (FOLVITE) 1 MG tablet Take 1 mg by mouth daily.     KRILL OIL OMEGA-3 PO Take 1 each by mouth daily.     Melatonin 5 MG CAPS Take 1 each by mouth at bedtime.     methotrexate 2.5 MG tablet Take 20 mg by mouth once a week.     Misc Natural Products (OSTEO BI-FLEX ADV JOINT SHIELD) TABS Take 1 each by mouth daily.     Multiple Vitamins-Minerals (CENTRUM SILVER ADULT 50+ PO) Take 1 each by mouth daily.     OVER THE COUNTER MEDICATION Systane Balance Opht-Apply 1 drop to each eye daily.     OVER THE COUNTER  MEDICATION Zaditor Eye Drops-Apply 1 drop to each eye daily.     pantoprazole (PROTONIX) 40 MG tablet Take 40 mg by mouth daily.     pregabalin (LYRICA) 100 MG capsule Take 1 tablet by mouth daily.     pregabalin (LYRICA) 25 MG capsule take one capsule po q am     valACYclovir (VALTREX) 500 MG tablet Take 1 tablet (500 mg total) by mouth 2 (two) times daily. Take 1 tablet (500 mg total) by mouth 2 (two) times daily as needed. 90 tablet 2   Allergies Allergies  Allergen Reactions   Oxycontin [Oxycodone Hcl] Shortness Of Breath and Palpitations   Hydrocodone     REACTION: SOB and chest pain   Iodine Diarrhea and Nausea And Vomiting    In high doses.   Family History Family History  Problem Relation Age of Onset   Bleeding Disorder Neg Hx     Review of Systems: Constitutional:  no fevers Eye:  no recent significant change in vision Ears:  No changes in hearing Nose/Mouth/Throat:  no complaints of nasal congestion, no sore throat Cardiovascular: no chest pain Respiratory:  No shortness of breath Gastrointestinal:  Having some intermittent abd pain  GU:  No frequency Integumentary:  no abnormal skin lesions reported Neurologic:  no headaches Endocrine:  denies unexplained weight changes  Exam BP 110/70 (BP Location: Left Arm, Patient Position: Sitting, Cuff Size: Normal)   Pulse (!) 57   Temp 97.6 F (36.4 C) (Oral)   Resp 18   Ht 5\' 9"  (1.753 m)   Wt 158 lb 9.6 oz (71.9 kg)   SpO2 98%   BMI 23.42 kg/m  General:  well developed, well nourished, in no apparent distress Skin: nails are spooned on hands; otherwise no significant moles, warts, or growths Head:  no masses, lesions, or tenderness Eyes:  pupils equal and round, sclera anicteric without injection Ears:  canals without lesions, TMs shiny without retraction, no obvious effusion, no erythema Nose:  nares patent, septum midline, mucosa normal Throat/Pharynx:  lips and gingiva without lesion; tongue and uvula midline;  non-inflamed pharynx; no exudates or postnasal drainage Lungs:  clear to auscultation, breath sounds equal bilaterally, no respiratory distress Cardio:  regular rhythm, bradycardic, no LE edema or bruits Rectal: Deferred GI: BS+, S, NT, ND, no masses or organomegaly Musculoskeletal:  symmetrical muscle groups noted without atrophy or deformity Neuro:  gait normal; deep tendon reflexes normal and symmetric Psych: well oriented with normal range of affect and appropriate judgment/insight  Assessment and Plan  Well adult exam - Plan: CBC, Comprehensive metabolic panel, Lipid panel  Screening for prostate cancer - Plan: PSA  Spooned nails - Plan: Zinc   Well 65 y.o. male. Counseled on diet and exercise. Other orders as above. Follow up in 1 yr or prn.  The patient voiced understanding and agreement to the plan.  [address] [place] [place], DO 09/27/21 9:30 AM

## 2021-09-27 NOTE — Patient Instructions (Addendum)
Give [place] 2-3 business days to get the results of your labs back.   Keep the diet clean and stay active.  The new Shingrix vaccine (for shingles) is a 2 shot series. It can make people feel low energy, achy and almost like they have the flu for 48 hours after injection. Please plan accordingly when deciding on when to get this shot. Call our office for a nurse visit appointment to get this. The second shot of the series is less severe regarding the side effects, but it still lasts 48 hours.   Send me a message if you want to have your umbilical hernia taken care of.  Call your GI team please.   Foods that may reduce pain: 1) Ginger 2) Blueberries 3) Salmon 4) Pumpkin seeds 5) dark chocolate 6) turmeric 7) tart cherries 8) virgin olive oil 9) chilli peppers 10) mint 11) red wine  Let us know if you need anything.

## 2021-09-30 ENCOUNTER — Other Ambulatory Visit: Payer: BC Managed Care – PPO

## 2021-09-30 DIAGNOSIS — E611 Iron deficiency: Secondary | ICD-10-CM | POA: Diagnosis not present

## 2021-09-30 NOTE — Addendum Note (Signed)
Addended by: Blenda Mounts on: 09/30/2021 09:11 AM   Modules accepted: Orders

## 2021-10-01 LAB — IRON,TIBC AND FERRITIN PANEL
%SAT: 28 % (calc) (ref 20–48)
Ferritin: 27 ng/mL (ref 24–380)
Iron: 104 ug/dL (ref 50–180)
TIBC: 377 mcg/dL (calc) (ref 250–425)

## 2021-10-01 LAB — ZINC: Zinc: 98 ug/dL (ref 60–130)

## 2021-10-02 ENCOUNTER — Other Ambulatory Visit: Payer: Self-pay | Admitting: Family Medicine

## 2021-10-02 ENCOUNTER — Other Ambulatory Visit: Payer: Self-pay | Admitting: Internal Medicine

## 2021-10-02 DIAGNOSIS — K429 Umbilical hernia without obstruction or gangrene: Secondary | ICD-10-CM

## 2021-10-02 DIAGNOSIS — B009 Herpesviral infection, unspecified: Secondary | ICD-10-CM

## 2021-10-02 MED ORDER — VALACYCLOVIR HCL 500 MG PO TABS: 500.0000 mg | ORAL_TABLET | Freq: Two times a day (BID) | ORAL | 3 refills | Status: DC

## 2021-10-18 ENCOUNTER — Telehealth: Payer: Self-pay | Admitting: Family Medicine

## 2021-10-18 NOTE — Telephone Encounter (Signed)
Faxed results.

## 2021-10-18 NOTE — Telephone Encounter (Signed)
Pt would like recent labs sent over to [facility]ology. Fax: [phone]. Please advise.

## 2021-11-18 DIAGNOSIS — M542 Cervicalgia: Secondary | ICD-10-CM | POA: Diagnosis not present

## 2021-11-18 DIAGNOSIS — M25531 Pain in right wrist: Secondary | ICD-10-CM | POA: Diagnosis not present

## 2021-11-18 DIAGNOSIS — Z79899 Other long term (current) drug therapy: Secondary | ICD-10-CM | POA: Diagnosis not present

## 2021-11-18 DIAGNOSIS — M25431 Effusion, right wrist: Secondary | ICD-10-CM | POA: Diagnosis not present

## 2021-11-20 DIAGNOSIS — K4090 Unilateral inguinal hernia, without obstruction or gangrene, not specified as recurrent: Secondary | ICD-10-CM | POA: Diagnosis not present

## 2021-11-20 DIAGNOSIS — K429 Umbilical hernia without obstruction or gangrene: Secondary | ICD-10-CM | POA: Diagnosis not present

## 2021-12-04 ENCOUNTER — Other Ambulatory Visit: Payer: Self-pay

## 2021-12-04 ENCOUNTER — Other Ambulatory Visit: Payer: BC Managed Care – PPO

## 2021-12-04 DIAGNOSIS — B20 Human immunodeficiency virus [HIV] disease: Secondary | ICD-10-CM | POA: Diagnosis not present

## 2021-12-05 ENCOUNTER — Encounter: Payer: Self-pay | Admitting: Internal Medicine

## 2021-12-05 LAB — T-HELPER CELL (CD4) - (RCID CLINIC ONLY)
CD4 % Helper T Cell: 37 % (ref 33–65)
CD4 T Cell Abs: 726 /uL (ref 400–1790)

## 2021-12-06 LAB — CBC
HCT: 41.8 % (ref 38.5–50.0)
Hemoglobin: 14.3 g/dL (ref 13.2–17.1)
MCH: 32.4 pg (ref 27.0–33.0)
MCHC: 34.2 g/dL (ref 32.0–36.0)
MCV: 94.8 fL (ref 80.0–100.0)
MPV: 10.5 fL (ref 7.5–12.5)
Platelets: 201 10*3/uL (ref 140–400)
RBC: 4.41 10*6/uL (ref 4.20–5.80)
RDW: 14.2 % (ref 11.0–15.0)
WBC: 6.7 10*3/uL (ref 3.8–10.8)

## 2021-12-06 LAB — LIPID PANEL
Cholesterol: 189 mg/dL (ref <200)
HDL: 63 mg/dL (ref >=40)
LDL Cholesterol (Calc): 101 mg/dL (calc) — ABNORMAL HIGH
Non-HDL Cholesterol (Calc): 126 mg/dL (calc) (ref <130)
Total CHOL/HDL Ratio: 3 (calc) (ref <5.0)
Triglycerides: 151 mg/dL — ABNORMAL HIGH (ref <150)

## 2021-12-06 LAB — COMPREHENSIVE METABOLIC PANEL
AG Ratio: 1.7 (calc) (ref 1.0–2.5)
ALT: 45 U/L (ref 9–46)
AST: 45 U/L — ABNORMAL HIGH (ref 10–35)
Albumin: 4 g/dL (ref 3.6–5.1)
Alkaline phosphatase (APISO): 59 U/L (ref 35–144)
BUN: 20 mg/dL (ref 7–25)
CO2: 27 mmol/L (ref 20–32)
Calcium: 9 mg/dL (ref 8.6–10.3)
Chloride: 106 mmol/L (ref 98–110)
Creat: 1 mg/dL (ref 0.70–1.35)
Globulin: 2.3 g/dL (calc) (ref 1.9–3.7)
Glucose, Bld: 91 mg/dL (ref 65–99)
Potassium: 4.1 mmol/L (ref 3.5–5.3)
Sodium: 140 mmol/L (ref 135–146)
Total Bilirubin: 0.5 mg/dL (ref 0.2–1.2)
Total Protein: 6.3 g/dL (ref 6.1–8.1)

## 2021-12-06 LAB — RPR: RPR Ser Ql: NONREACTIVE

## 2021-12-06 LAB — HIV-1 RNA QUANT-NO REFLEX-BLD
HIV 1 RNA Quant: NOT DETECTED Copies/mL
HIV-1 RNA Quant, Log: NOT DETECTED Log cps/mL

## 2021-12-24 ENCOUNTER — Ambulatory Visit: Payer: BC Managed Care – PPO | Admitting: Internal Medicine

## 2021-12-24 ENCOUNTER — Encounter: Payer: Self-pay | Admitting: Internal Medicine

## 2021-12-24 ENCOUNTER — Other Ambulatory Visit: Payer: Self-pay

## 2021-12-24 DIAGNOSIS — B20 Human immunodeficiency virus [HIV] disease: Secondary | ICD-10-CM

## 2021-12-24 MED ORDER — BIKTARVY 50-200-25 MG PO TABS: 1.0000 | ORAL_TABLET | Freq: Every day | ORAL | 3 refills | Status: DC

## 2021-12-24 NOTE — Assessment & Plan Note (Signed)
His infection remains under excellent, long-term control.  He will continue Biktarvy and follow-up after lab work in 1 year. 

## 2021-12-24 NOTE — Progress Notes (Signed)
Patient Active Problem List   Diagnosis Date Noted   Human immunodeficiency virus (HIV) disease (HCC) 02/12/2007    Priority: High   Rheumatoid arthritis (HCC) 04/14/2016    Priority: Medium    History of calcium pyrophosphate deposition disease (CPPD) 07/25/2013    Priority: Medium    Visit for preventive health examination 10/07/2013   Gout 10/07/2013   Degenerative arthritis of spine 11/27/2011   EPISTAXIS, RECURRENT 11/26/2010   IRRITABLE BOWEL SYNDROME 05/23/2008   HERPES LABIALIS 02/23/2007   GERD 02/23/2007   HERPES ZOSTER, UNCOMPLICATED 02/12/2007   DEVIATED NASAL SEPTUM 02/12/2007   ALLERGIC RHINITIS 02/12/2007   PSORIASIS 02/12/2007   DIARRHEA 02/12/2007   KNEE INJURY 02/12/2007    Patient's Medications  New Prescriptions   No medications on file  Previous Medications   ASCORBIC ACID (VITAMIN C WITH ROSE HIPS) 500 MG TABLET    Take 500 mg by mouth daily.   AZELASTINE HCL 0.15 % SOLN    Place 2 sprays into the nose daily.   DIGESTIVE ENZYMES (DIGESTIVE ENZYME PO)    Take by mouth.   ETANERCEPT (ENBREL) 50 MG/ML INJECTION    Inject 0.98 mLs (50 mg total) into the skin once a week.   FLUTICASONE (FLONASE) 50 MCG/ACT NASAL SPRAY    USE TWO SPRAY(S) IN EACH NOSTRIL ONCE DAILY   FOLIC ACID (FOLVITE) 1 MG TABLET    Take 1 mg by mouth daily.   HUMIRA PEN 40 MG/0.4ML PNKT    SMARTSIG:40 Milligram(s) SUB-Q Every 2 Weeks   KRILL OIL OMEGA-3 PO    Take 1 each by mouth daily.   MELATONIN 5 MG CAPS    Take 1 each by mouth at bedtime.   METHOTREXATE 2.5 MG TABLET    Take 20 mg by mouth once a week.   MISC NATURAL PRODUCTS (OSTEO BI-FLEX ADV JOINT SHIELD) TABS    Take 1 each by mouth daily.   MULTIPLE VITAMINS-MINERALS (CENTRUM SILVER ADULT 50+ PO)    Take 1 each by mouth daily.   MULTIPLE VITAMINS-MINERALS (PRESERVISION AREDS PO)       OVER THE COUNTER MEDICATION    Systane Balance Opht-Apply 1 drop to each eye daily.   OVER THE COUNTER MEDICATION    Zaditor Eye  Drops-Apply 1 drop to each eye daily.   PANTOPRAZOLE (PROTONIX) 40 MG TABLET    Take 40 mg by mouth daily.   PREGABALIN (LYRICA) 100 MG CAPSULE    Take 1 tablet by mouth daily.   PREGABALIN (LYRICA) 25 MG CAPSULE    take one capsule po q am   PREGABALIN (LYRICA) 25 MG CAPSULE    Take by mouth.   PROBIOTIC PRODUCT (PROBIOTIC-10 PO)    Take by mouth.   PROPYLENE GLYCOL (SYSTANE COMPLETE OP)       VALACYCLOVIR (VALTREX) 500 MG TABLET    Take 1 tablet (500 mg total) by mouth 2 (two) times daily. Take 1 tablet (500 mg total) by mouth 2 (two) times daily as needed.  Modified Medications   Modified Medication Previous Medication   BICTEGRAVIR-EMTRICITABINE-TENOFOVIR AF (BIKTARVY) 50-200-25 MG TABS TABLET BIKTARVY 50-200-25 MG TABS tablet      Take 1 tablet by mouth daily.    TAKE 1 TABLET BY MOUTH EVERY DAY  Discontinued Medications   No medications on file    Subjective: Craig Morales is in for his routine HIV follow-up visit.  He denies any problems obtaining, taking or tolerating his  Biktarvy and, as usual, he cannot recall ever missing doses.  He is going to be having surgery for his umbilical and left inguinal hernias in March.  He is going to be switching from Enbrel to Humira for his rheumatoid arthritis because of ongoing stiffness and pain in his right wrist.  He is up-to-date on his influenza and COVID vaccines.  Review of Systems: Review of Systems  Constitutional:  Negative for weight loss.  Musculoskeletal:  Positive for back pain, joint pain and neck pain.   Past Medical History:  Diagnosis Date   GERD (gastroesophageal reflux disease)    HIV (human immunodeficiency virus infection) (HCC)    Seasonal allergies     Social History   Tobacco Use   Smoking status: Never   Smokeless tobacco: Never  Substance Use Topics   Alcohol use: Yes    Alcohol/week: 7.0 standard drinks    Types: 7 Standard drinks or equivalent per week    Comment: occasional   Drug use: No    Family History   Problem Relation Age of Onset   Bleeding Disorder Neg Hx     Allergies  Allergen Reactions   Oxycontin [Oxycodone Hcl] Shortness Of Breath and Palpitations   Hydrocodone     REACTION: SOB and chest pain   Iodine Diarrhea and Nausea And Vomiting    In high doses.    Health Maintenance  Topic Date Due   COVID-19 Vaccine (6 - Booster for Pfizer series) 10/07/2021   Zoster Vaccines- Shingrix (1 of 2) 03/28/2022 (Originally 02/24/1975)   TETANUS/TDAP  10/08/2023   COLONOSCOPY (Pts 45-73yrs Insurance coverage will need to be confirmed)  04/07/2027   Pneumonia Vaccine 58+ Years old  Completed   INFLUENZA VACCINE  Completed   Hepatitis C Screening  Completed   HIV Screening  Completed   HPV VACCINES  Aged Out    Objective:  Vitals:   12/24/21 1614  BP: 121/81  Pulse: 60  Temp: 98.1 F (36.7 C)  TempSrc: Oral  Weight: 165 lb (74.8 kg)  Height: 5\' 10"  (1.778 m)   Body mass index is 23.68 kg/m.  Physical Exam Constitutional:      Comments: He is talkative and in good spirits as usual.  Cardiovascular:     Rate and Rhythm: Normal rate and regular rhythm.     Heart sounds: No murmur heard. Pulmonary:     Effort: Pulmonary effort is normal.     Breath sounds: Normal breath sounds.  Abdominal:     Hernia: A hernia is present.     Comments: He has a soft umbilical hernia.  Musculoskeletal:     Comments: He has no acute inflammation of his right wrist or other joints.  Psychiatric:        Mood and Affect: Mood normal.    Lab Results Lab Results  Component Value Date   WBC 6.7 12/04/2021   HGB 14.3 12/04/2021   HCT 41.8 12/04/2021   MCV 94.8 12/04/2021   PLT 201 12/04/2021    Lab Results  Component Value Date   CREATININE 1.00 12/04/2021   BUN 20 12/04/2021   NA 140 12/04/2021   K 4.1 12/04/2021   CL 106 12/04/2021   CO2 27 12/04/2021    Lab Results  Component Value Date   ALT 45 12/04/2021   AST 45 (H) 12/04/2021   ALKPHOS 50 09/27/2021   BILITOT 0.5  12/04/2021    Lab Results  Component Value Date   CHOL 189  12/04/2021   HDL 63 12/04/2021   LDLCALC 101 (H) 12/04/2021   TRIG 151 (H) 12/04/2021   CHOLHDL 3.0 12/04/2021   Lab Results  Component Value Date   LABRPR NON-REACTIVE 12/04/2021   HIV 1 RNA Quant  Date Value  12/04/2021 Not Detected Copies/mL  12/04/2020 <20 Copies/mL (H)  11/17/2019 26 copies/mL (H)   CD4 T Cell Abs (/uL)  Date Value  12/04/2021 726  12/04/2020 669  11/17/2019 768     Problem List Items Addressed This Visit       High   Human immunodeficiency virus (HIV) disease (HCC)    His infection remains under excellent, long-term control.  He will continue Biktarvy and follow-up after lab work in 1 year.      Relevant Medications   bictegravir-emtricitabine-tenofovir AF (BIKTARVY) 50-200-25 MG TABS tablet   Other Relevant Orders   CBC   T-helper cell (CD4)- ([facility] clinic only)   Comprehensive metabolic panel   Lipid panel   RPR   HIV-1 RNA quant-no reflex-bld      Cliffton Asters, MD [facility] [phone] pager   [phone] cell 12/24/2021, 4:57 PM

## 2021-12-26 DIAGNOSIS — M5412 Radiculopathy, cervical region: Secondary | ICD-10-CM | POA: Diagnosis not present

## 2022-01-22 DIAGNOSIS — M47812 Spondylosis without myelopathy or radiculopathy, cervical region: Secondary | ICD-10-CM | POA: Diagnosis not present

## 2022-01-22 DIAGNOSIS — M5412 Radiculopathy, cervical region: Secondary | ICD-10-CM | POA: Diagnosis not present

## 2022-02-06 ENCOUNTER — Ambulatory Visit (INDEPENDENT_AMBULATORY_CARE_PROVIDER_SITE_OTHER): Payer: BC Managed Care – PPO | Admitting: Sports Medicine

## 2022-02-06 ENCOUNTER — Ambulatory Visit: Payer: BC Managed Care – PPO | Admitting: Family

## 2022-02-06 ENCOUNTER — Other Ambulatory Visit: Payer: Self-pay

## 2022-02-06 ENCOUNTER — Ambulatory Visit: Payer: BC Managed Care – PPO

## 2022-02-06 ENCOUNTER — Ambulatory Visit: Payer: Self-pay

## 2022-02-06 ENCOUNTER — Encounter: Payer: Self-pay | Admitting: Family

## 2022-02-06 ENCOUNTER — Ambulatory Visit (INDEPENDENT_AMBULATORY_CARE_PROVIDER_SITE_OTHER): Payer: BC Managed Care – PPO

## 2022-02-06 VITALS — BP 122/80 | HR 65 | Ht 71.0 in | Wt 160.4 lb

## 2022-02-06 VITALS — BP 110/76 | HR 64 | Temp 98.0°F | Ht 71.0 in | Wt 159.8 lb

## 2022-02-06 DIAGNOSIS — M25561 Pain in right knee: Secondary | ICD-10-CM

## 2022-02-06 DIAGNOSIS — M25461 Effusion, right knee: Secondary | ICD-10-CM

## 2022-02-06 DIAGNOSIS — M25469 Effusion, unspecified knee: Secondary | ICD-10-CM | POA: Diagnosis not present

## 2022-02-06 DIAGNOSIS — M1711 Unilateral primary osteoarthritis, right knee: Secondary | ICD-10-CM | POA: Diagnosis not present

## 2022-02-06 NOTE — Progress Notes (Signed)
? ? Aleen SellsBen Leandria Thier D.Judd Gaudier. CAQSM ?[facility] ?[address] [place] [id number] ?Phone: [phone] ?  ?Assessment and Plan:   ?  ?1. Acute pain of right knee ?2. Effusion of bursa of right knee ?-Acute, initial sports medicine visit ?- Prepatellar bursitis with large fluid collection aspirated at today's visit and tolerated well per note below ?- Due to large effusion, patient's history of rheumatoid arthritis, chondrocalcinosis appearance on x-ray, we will send fluid for analysis ?- X-ray obtained in clinic.  My interpretation: No acute fracture or dislocation.  Crystal structure at medial knee with mild chondrocalcinosis.  Prepatellar effusion ?- Use Tylenol as needed for pain control ?- US LIMITED JOINT SPACE STRUCTURES LOW BILAT(NO LINKED CHARGES) ?- Anaerobic and Aerobic Culture; Future ?- Synovial cell count + diff, w/ crystals; Future  ?  ?Procedure: Ultrasound Guided Knee prepatellar bursa aspiration ?Side: Right ?Diagnosis: Prepatellar bursitis ?US Indication:  ?- accuracy is paramount for diagnosis ?- to ensure therapeutic efficacy or procedural success ?- to reduce procedural risk ? ?Risks explained and consent was given verbally. The site was cleaned with Chlorhexidine. The pre-patellar pouch of the knee was identified.   An 18-gauge needle was introduced to the pouch under ultrasound guidance.  42 mL of  clear/straw colored synovial fluid was aspirated.    Needle was removed, hemostasis achieved, and post injection instructions were explained.   Pt was advised to call or return to clinic if these symptoms worsen or fail to improve as anticipated.  ? ?Pertinent previous records reviewed include none ?  ?Follow Up: As needed for improvement or worsening of symptoms.  Could consider repeat aspiration and drainage with corticosteroid injection if fluid analysis comes back within normal limits ?  ?Subjective:   ?I, Jerene CannyMoenique Parris, am serving as a Neurosurgeonscribe for Doctor Abbott LaboratoriesBenjamin  Rohn Fritsch ? ?Chief Complaint: right knee drain  ? ?HPI:  ? ?02/06/22 ?Patient is a 66 year old male complaining of right knee pain. Patient states that his knee has been swelling for two weeks, really sore around the knee cap and its getting harder getting up and down because of his dogs , its causing him a lot of pain, no MOI only thing he can think of is when he was going down to his dog and maybe twisting his knee but doesn't know for sure. States there is a lot of heat to his knee, has been taking tylenol and that's not really helping, does not feel any clicking or popping , does not get numbness or tingling. Having to alter gait is starting to aggravates his bursitis , is having hernia surgery in the middle of march  ? ?Relevant Historical Information: Rheumatoid arthritis ? ?Additional pertinent review of systems negative. ? ? ?Current Outpatient Medications:  ?  Ascorbic Acid (VITAMIN C WITH ROSE HIPS) 500 MG tablet, Take 500 mg by mouth daily., Disp: , Rfl:  ?  bictegravir-emtricitabine-tenofovir AF (BIKTARVY) 50-200-25 MG TABS tablet, Take 1 tablet by mouth daily., Disp: 90 tablet, Rfl: 3 ?  Digestive Enzymes (DIGESTIVE ENZYME PO), Take by mouth., Disp: , Rfl:  ?  fluticasone (FLONASE) 50 MCG/ACT nasal spray, USE TWO SPRAY(S) IN EACH NOSTRIL ONCE DAILY, Disp: 16 g, Rfl: 6 ?  folic acid (FOLVITE) 1 MG tablet, Take 1 mg by mouth daily., Disp: , Rfl:  ?  HUMIRA PEN 40 MG/0.4ML PNKT, SMARTSIG:40 Milligram(s) SUB-Q Every 2 Weeks, Disp: , Rfl:  ?  KRILL OIL OMEGA-3 PO, Take 1 each by mouth daily., Disp: ,  Rfl:  ?  Melatonin 5 MG CAPS, Take 1 each by mouth at bedtime., Disp: , Rfl:  ?  methotrexate 2.5 MG tablet, Take 20 mg by mouth once a week., Disp: , Rfl:  ?  Misc Natural Products (OSTEO BI-FLEX ADV JOINT SHIELD) TABS, Take 1 each by mouth daily., Disp: , Rfl:  ?  Multiple Vitamins-Minerals (CENTRUM SILVER ADULT 50+ PO), Take 1 each by mouth daily., Disp: , Rfl:  ?  Multiple Vitamins-Minerals (PRESERVISION AREDS  PO), , Disp: , Rfl:  ?  OVER THE COUNTER MEDICATION, Systane Balance Opht-Apply 1 drop to each eye daily., Disp: , Rfl:  ?  pantoprazole (PROTONIX) 40 MG tablet, Take 40 mg by mouth daily., Disp: , Rfl:  ?  pregabalin (LYRICA) 100 MG capsule, Take 1 tablet by mouth daily., Disp: , Rfl:  ?  pregabalin (LYRICA) 25 MG capsule, take one capsule po q am, Disp: , Rfl:  ?  Probiotic Product (PROBIOTIC-10 PO), Take by mouth., Disp: , Rfl:  ?  Propylene Glycol (SYSTANE COMPLETE OP), , Disp: , Rfl:  ?  valACYclovir (VALTREX) 500 MG tablet, Take 1 tablet (500 mg total) by mouth 2 (two) times daily. Take 1 tablet (500 mg total) by mouth 2 (two) times daily as needed., Disp: 90 tablet, Rfl: 3  ? ?Objective:   ?  ?Vitals:  ? 02/06/22 1324  ?BP: 122/80  ?Pulse: 65  ?SpO2: 97%  ?Weight: 160 lb 6.4 oz (72.8 kg)  ?Height: 5\' 11"  (1.803 m)  ?  ?  ?Body mass index is 22.37 kg/m?.  ?  ?Physical Exam:   ? ?General:  awake, alert oriented, no acute distress nontoxic ?Skin: no suspicious lesions or rashes ?Neuro:sensation intact, no deficits, strength 5/5 with no deficits, no atrophy, normal muscle tone ?Psych: No signs of anxiety, depression or other mood disorder ? ?Knee: ?Significant prepatellar swelling ?No deformity ?Neg fluid wave, joint milking ?ROM Flex 110 , Ext 0  ?NTTP over the quad tendon, medial fem condyle, lat fem condyle, patella, plica, patella tendon, tibial tuberostiy, fibular head, posterior fossa, pes anserine bursa, gerdy's tubercle, medial jt line, lateral jt line ?Neg anterior and posterior drawer ?Neg lachman ?Neg sag sign ?Negative varus stress ?Negative valgus stress ?  ? ?Gait normal  ? ? ?Electronically signed by:  ?Aleen Sells D.Judd Gaudier ?[facility] ?2:04 PM 02/06/22 ?

## 2022-02-06 NOTE — Progress Notes (Signed)
?Craig Morales is a 66 y.o. male with the following history as recorded in EpicCare:  ?Patient Active Problem List  ? Diagnosis Date Noted  ? Rheumatoid arthritis (HCC) 04/14/2016  ? Visit for preventive health examination 10/07/2013  ? Gout 10/07/2013  ? History of calcium pyrophosphate deposition disease (CPPD) 07/25/2013  ? Degenerative arthritis of spine 11/27/2011  ? EPISTAXIS, RECURRENT 11/26/2010  ? IRRITABLE BOWEL SYNDROME 05/23/2008  ? HERPES LABIALIS 02/23/2007  ? GERD 02/23/2007  ? Human immunodeficiency virus (HIV) disease (HCC) 02/12/2007  ? HERPES ZOSTER, UNCOMPLICATED 02/12/2007  ? DEVIATED NASAL SEPTUM 02/12/2007  ? ALLERGIC RHINITIS 02/12/2007  ? PSORIASIS 02/12/2007  ? DIARRHEA 02/12/2007  ? KNEE INJURY 02/12/2007  ?  ?Current Outpatient Medications  ?Medication Sig Dispense Refill  ? Ascorbic Acid (VITAMIN C WITH ROSE HIPS) 500 MG tablet Take 500 mg by mouth daily.    ? bictegravir-emtricitabine-tenofovir AF (BIKTARVY) 50-200-25 MG TABS tablet Take 1 tablet by mouth daily. 90 tablet 3  ? Digestive Enzymes (DIGESTIVE ENZYME PO) Take by mouth.    ? fluticasone (FLONASE) 50 MCG/ACT nasal spray USE TWO SPRAY(S) IN EACH NOSTRIL ONCE DAILY 16 g 6  ? folic acid (FOLVITE) 1 MG tablet Take 1 mg by mouth daily.    ? HUMIRA PEN 40 MG/0.4ML PNKT SMARTSIG:40 Milligram(s) SUB-Q Every 2 Weeks    ? KRILL OIL OMEGA-3 PO Take 1 each by mouth daily.    ? Melatonin 5 MG CAPS Take 1 each by mouth at bedtime.    ? methotrexate 2.5 MG tablet Take 20 mg by mouth once a week.    ? Misc Natural Products (OSTEO BI-FLEX ADV JOINT SHIELD) TABS Take 1 each by mouth daily.    ? Multiple Vitamins-Minerals (CENTRUM SILVER ADULT 50+ PO) Take 1 each by mouth daily.    ? Multiple Vitamins-Minerals (PRESERVISION AREDS PO)     ? OVER THE COUNTER MEDICATION Systane Balance Opht-Apply 1 drop to each eye daily.    ? pantoprazole (PROTONIX) 40 MG tablet Take 40 mg by mouth daily.    ? pregabalin (LYRICA) 100 MG capsule Take 1 tablet by  mouth daily.    ? pregabalin (LYRICA) 25 MG capsule take one capsule po q am    ? Probiotic Product (PROBIOTIC-10 PO) Take by mouth.    ? Propylene Glycol (SYSTANE COMPLETE OP)     ? valACYclovir (VALTREX) 500 MG tablet Take 1 tablet (500 mg total) by mouth 2 (two) times daily. Take 1 tablet (500 mg total) by mouth 2 (two) times daily as needed. 90 tablet 3  ? ?No current facility-administered medications for this visit.  ?  ?Allergies: Oxycontin [oxycodone hcl], Hydrocodone, and Iodine  ?Past Medical History:  ?Diagnosis Date  ? GERD (gastroesophageal reflux disease)   ? HIV (human immunodeficiency virus infection) (HCC)   ? Seasonal allergies   ?  ?Past Surgical History:  ?Procedure Laterality Date  ? KNEE SURGERY    ? NASAL SEPTUM SURGERY    ?  ?Family History  ?Problem Relation Age of Onset  ? Bleeding Disorder Neg Hx   ?  ?Social History  ? ?Tobacco Use  ? Smoking status: Never  ? Smokeless tobacco: Never  ?Substance Use Topics  ? Alcohol use: Yes  ?  Alcohol/week: 7.0 standard drinks  ?  Types: 7 Standard drinks or equivalent per week  ?  Comment: occasional  ?  ?Subjective:  ? ?Right knee pain and swelling x 2 weeks; no known injury or trauma;  symptoms do seem worse in the past 4 days;  ?Does have RA- per patient was recently started on Humira per his rheumatologist; has not reached out to rheumatology regarding swelling; no prior history of gout;  ? ? ?Objective:  ?Vitals:  ? 02/06/22 1050  ?BP: 110/76  ?Pulse: 64  ?Temp: 98 ?F (36.7 ?C)  ?TempSrc: Oral  ?SpO2: 98%  ?Weight: 159 lb 12.8 oz (72.5 kg)  ?Height: 5\' 11"  (1.803 m)  ?  ?General: Well developed, well nourished, in no acute distress  ?Skin : Warm and dry.  ?Head: Normocephalic and atraumatic  ?Lungs: Respirations unlabored;  ?Musculoskeletal: No deformities; marked swelling/ effusion noted over right knee ?Extremities: No edema, cyanosis, clubbing  ?Vessels: Symmetric bilaterally  ?Neurologic: Alert and oriented; speech intact; face symmetrical;  moves all extremities well; CNII-XII intact without focal deficit  ? ?Assessment:  ?1. Pain and swelling of right knee   ?  ?Plan:  ?Refer to sports medicine today for ultrasound and treatment; follow up with his PCP as needed otherwise.  ? ?This visit occurred during the SARS-CoV-2 public health emergency.  Safety protocols were in place, including screening questions prior to the visit, additional usage of staff PPE, and extensive cleaning of exam room while observing appropriate contact time as indicated for disinfecting solutions.  ? ? ?No follow-ups on file.  ?No orders of the defined types were placed in this encounter. ?  ?Requested Prescriptions  ? ? No prescriptions requested or ordered in this encounter  ?  ? ?

## 2022-02-06 NOTE — Patient Instructions (Addendum)
Good to see you  ?As needed follow up if you would like to have your knee re- drained or a steroid injection  ?

## 2022-02-06 NOTE — Addendum Note (Signed)
Addended by: Jerene Canny R on: 02/06/2022 02:35 PM ? ? Modules accepted: Orders ? ?

## 2022-02-07 LAB — SYNOVIAL FLUID ANALYSIS, COMPLETE
Basophils, %: 0 %
Eosinophils-Synovial: 0 % (ref 0–2)
Lymphocytes-Synovial Fld: 25 % (ref 0–74)
Monocyte/Macrophage: 9 % (ref 0–69)
Neutrophil, Synovial: 66 % — ABNORMAL HIGH (ref 0–24)
Synoviocytes, %: 0 % (ref 0–15)
WBC, Synovial: 1397 cells/uL — ABNORMAL HIGH (ref <150)

## 2022-02-12 LAB — ANAEROBIC AND AEROBIC CULTURE
AER RESULT:: NO GROWTH
MICRO NUMBER:: 13079648
MICRO NUMBER:: 13079649
SPECIMEN QUALITY:: ADEQUATE
SPECIMEN QUALITY:: ADEQUATE

## 2022-02-17 DIAGNOSIS — K429 Umbilical hernia without obstruction or gangrene: Secondary | ICD-10-CM | POA: Diagnosis not present

## 2022-02-17 DIAGNOSIS — K4090 Unilateral inguinal hernia, without obstruction or gangrene, not specified as recurrent: Secondary | ICD-10-CM | POA: Diagnosis not present

## 2022-03-03 DIAGNOSIS — M25531 Pain in right wrist: Secondary | ICD-10-CM | POA: Diagnosis not present

## 2022-03-03 DIAGNOSIS — Z79899 Other long term (current) drug therapy: Secondary | ICD-10-CM | POA: Diagnosis not present

## 2022-03-03 DIAGNOSIS — M069 Rheumatoid arthritis, unspecified: Secondary | ICD-10-CM | POA: Diagnosis not present

## 2022-03-03 DIAGNOSIS — M25431 Effusion, right wrist: Secondary | ICD-10-CM | POA: Diagnosis not present

## 2022-04-22 DIAGNOSIS — M5412 Radiculopathy, cervical region: Secondary | ICD-10-CM | POA: Diagnosis not present

## 2022-04-24 DIAGNOSIS — Z800 Family history of malignant neoplasm of digestive organs: Secondary | ICD-10-CM | POA: Diagnosis not present

## 2022-04-24 DIAGNOSIS — K219 Gastro-esophageal reflux disease without esophagitis: Secondary | ICD-10-CM | POA: Diagnosis not present

## 2022-04-24 DIAGNOSIS — Z86010 Personal history of colonic polyps: Secondary | ICD-10-CM | POA: Diagnosis not present

## 2022-05-15 NOTE — Progress Notes (Signed)
Craig Morales D.[facility] [address] [place] [address] Phone: [phone]   Assessment and Plan:     1. Costochondritis -Subacute, uncomplicated, initial sports medicine visit -Consistent with oblique strain on right and intercostal muscle strain right ribs 6-8 with mild improvement in symptoms with times and as needed Tylenol - Start meloxicam 15 mg daily x2 weeks.  If still having pain after 2 weeks, complete 3rd-week of meloxicam. May use remaining meloxicam as needed once daily for pain control.  Do not to use additional NSAIDs while taking meloxicam.  May use Tylenol [phone] mg 2 to 3 times a day for breakthrough pain. - May use topical Voltaren gel as needed - Avoid core activities x1 week and then reintroduce  Pertinent previous records reviewed include none   Follow Up: As needed if no improvement or worsening of symptoms.  Would obtain right rib x-ray at that time   Subjective:   I, Craig Morales, am serving as a Neurosurgeon for Doctor Richardean Sale  Chief Complaint: right sided rib cage pain   HPI:   05/16/2022 Patient is a 66 year old male complaining of right sided rib cage pain. Patient states that a couple of months ago he was bent over the recycling bin and thinks he bent to far and its still bothering him , is able to take good nice deep breathes , is able to stretch with no pain, states he is just sore in that rib cage area, no clicking or popping, no bruising, does take tylenol, heat and ice didn't seem to help   Relevant Historical Information: Rheumatoid arthritis, IBS, HIV  Additional pertinent review of systems negative.   Current Outpatient Medications:    Ascorbic Acid (VITAMIN C WITH ROSE HIPS) 500 MG tablet, Take 500 mg by mouth daily., Disp: , Rfl:    bictegravir-emtricitabine-tenofovir AF (BIKTARVY) 50-200-25 MG TABS tablet, Take 1 tablet by mouth daily., Disp: 90 tablet, Rfl: 3   Digestive Enzymes  (DIGESTIVE ENZYME PO), Take by mouth., Disp: , Rfl:    fluticasone (FLONASE) 50 MCG/ACT nasal spray, USE TWO SPRAY(S) IN EACH NOSTRIL ONCE DAILY, Disp: 16 g, Rfl: 6   folic acid (FOLVITE) 1 MG tablet, Take 1 mg by mouth daily., Disp: , Rfl:    HUMIRA PEN 40 MG/0.4ML PNKT, SMARTSIG:40 Milligram(s) SUB-Q Every 2 Weeks, Disp: , Rfl:    KRILL OIL OMEGA-3 PO, Take 1 each by mouth daily., Disp: , Rfl:    Melatonin 5 MG CAPS, Take 1 each by mouth at bedtime., Disp: , Rfl:    meloxicam (MOBIC) 15 MG tablet, Take 1 tablet (15 mg total) by mouth daily., Disp: 30 tablet, Rfl: 0   methotrexate 2.5 MG tablet, Take 20 mg by mouth once a week., Disp: , Rfl:    Misc Natural Products (OSTEO BI-FLEX ADV JOINT SHIELD) TABS, Take 1 each by mouth daily., Disp: , Rfl:    Multiple Vitamins-Minerals (CENTRUM SILVER ADULT 50+ PO), Take 1 each by mouth daily., Disp: , Rfl:    Multiple Vitamins-Minerals (PRESERVISION AREDS PO), , Disp: , Rfl:    OVER THE COUNTER MEDICATION, Systane Balance Opht-Apply 1 drop to each eye daily., Disp: , Rfl:    pantoprazole (PROTONIX) 40 MG tablet, Take 40 mg by mouth daily., Disp: , Rfl:    pregabalin (LYRICA) 100 MG capsule, Take 1 tablet by mouth daily., Disp: , Rfl:    pregabalin (LYRICA) 25 MG capsule, take one capsule po q am,  Disp: , Rfl:    Probiotic Product (PROBIOTIC-10 PO), Take by mouth., Disp: , Rfl:    Propylene Glycol (SYSTANE COMPLETE OP), , Disp: , Rfl:    valACYclovir (VALTREX) 500 MG tablet, Take 1 tablet (500 mg total) by mouth 2 (two) times daily. Take 1 tablet (500 mg total) by mouth 2 (two) times daily as needed., Disp: 90 tablet, Rfl: 3   Objective:     Vitals:   05/16/22 0845  BP: 110/76  Pulse: (!) 59  SpO2: 96%  Weight: 165 lb (74.8 kg)  Height: 5\' 11"  (1.803 m)      Body mass index is 23.01 kg/m.    Physical Exam:    General: Well-appearing, cooperative, sitting comfortably in no acute distress.  HEENT: Normocephalic, atraumatic.      Cardiovascular: No pallor or cyanosis. Resp: Comfortable WOB.  No pain with deep inhalation Abdomen: Non distended.   Skin: Warm and dry; no focal rashes identified on limited exam. Extremities: No cyanosis or edema.  Neuro: Gross motor and sensory intact. Gait normal. MSK: TTP ribs 6-8 on right at axillary line.  No step-off, no deformity, no bruising, no flail chest.   Electronically signed by:  Craig Morales D.[facility] 9:12 AM 05/16/22

## 2022-05-16 ENCOUNTER — Ambulatory Visit (INDEPENDENT_AMBULATORY_CARE_PROVIDER_SITE_OTHER): Payer: BC Managed Care – PPO | Admitting: Sports Medicine

## 2022-05-16 VITALS — BP 110/76 | HR 59 | Ht 71.0 in | Wt 165.0 lb

## 2022-05-16 DIAGNOSIS — M940 Chondrocostal junction syndrome [Tietze]: Secondary | ICD-10-CM | POA: Diagnosis not present

## 2022-05-16 MED ORDER — MELOXICAM 15 MG PO TABS: 15.0000 mg | ORAL_TABLET | Freq: Every day | ORAL | 0 refills | Status: DC

## 2022-05-16 NOTE — Patient Instructions (Addendum)
Good to see you  - Start meloxicam 15 mg daily x2 weeks.  If still having pain after 2 weeks, complete 3rd-week of meloxicam. May use remaining meloxicam as needed once daily for pain control.  Do not to use additional NSAIDs while taking meloxicam.  May use Tylenol [phone] mg 2 to 3 times a day for breakthrough pain. Decrease core exercises for 1 week Voltaren gel topically as needed As needed follow up

## 2022-05-30 DIAGNOSIS — M5412 Radiculopathy, cervical region: Secondary | ICD-10-CM | POA: Diagnosis not present

## 2022-06-04 DIAGNOSIS — M25431 Effusion, right wrist: Secondary | ICD-10-CM | POA: Diagnosis not present

## 2022-06-04 DIAGNOSIS — Z79899 Other long term (current) drug therapy: Secondary | ICD-10-CM | POA: Diagnosis not present

## 2022-06-04 DIAGNOSIS — M069 Rheumatoid arthritis, unspecified: Secondary | ICD-10-CM | POA: Diagnosis not present

## 2022-06-04 DIAGNOSIS — M1180 Other specified crystal arthropathies, unspecified site: Secondary | ICD-10-CM | POA: Diagnosis not present

## 2022-06-13 ENCOUNTER — Other Ambulatory Visit: Payer: Self-pay | Admitting: Sports Medicine

## 2022-07-22 ENCOUNTER — Encounter: Payer: Self-pay | Admitting: Internal Medicine

## 2022-07-23 NOTE — Telephone Encounter (Signed)
Patient called back and is scheduled for visit next week 8/23 at 8:45. [name], RMA

## 2022-07-24 DIAGNOSIS — M5412 Radiculopathy, cervical region: Secondary | ICD-10-CM | POA: Diagnosis not present

## 2022-07-26 ENCOUNTER — Other Ambulatory Visit: Payer: Self-pay | Admitting: Sports Medicine

## 2022-07-30 ENCOUNTER — Encounter: Payer: Self-pay | Admitting: Internal Medicine

## 2022-07-30 ENCOUNTER — Other Ambulatory Visit: Payer: Self-pay

## 2022-07-30 ENCOUNTER — Ambulatory Visit (INDEPENDENT_AMBULATORY_CARE_PROVIDER_SITE_OTHER): Payer: BC Managed Care – PPO | Admitting: Internal Medicine

## 2022-07-30 DIAGNOSIS — E881 Lipodystrophy, not elsewhere classified: Secondary | ICD-10-CM

## 2022-07-30 NOTE — Assessment & Plan Note (Signed)
I reviewed the nature of HIV-associated lipodystrophy with him.  I do not think Craig Morales is components are causing the problem and I do not think a switch in antiretroviral therapy will be useful.  I do not think he is a candidate for metformin and I do not think that a graft injections offer enough potential benefit to warrant inconvenience and potential risk.  I encouraged him to regulate his sleep, reduce his work hours and get more exercise.

## 2022-07-30 NOTE — Progress Notes (Signed)
Patient Active Problem List   Diagnosis Date Noted   Human immunodeficiency virus (HIV) disease (HCC) 02/12/2007    Priority: High   Rheumatoid arthritis (HCC) 04/14/2016    Priority: Medium    History of calcium pyrophosphate deposition disease (CPPD) 07/25/2013    Priority: Medium    Lipodystrophy 07/30/2022   Visit for preventive health examination 10/07/2013   Gout 10/07/2013   Degenerative arthritis of spine 11/27/2011   EPISTAXIS, RECURRENT 11/26/2010   IRRITABLE BOWEL SYNDROME 05/23/2008   HERPES LABIALIS 02/23/2007   GERD 02/23/2007   HERPES ZOSTER, UNCOMPLICATED 02/12/2007   DEVIATED NASAL SEPTUM 02/12/2007   ALLERGIC RHINITIS 02/12/2007   PSORIASIS 02/12/2007   DIARRHEA 02/12/2007   KNEE INJURY 02/12/2007    Patient's Medications  New Prescriptions   No medications on file  Previous Medications   ASCORBIC ACID (VITAMIN C WITH ROSE HIPS) 500 MG TABLET    Take 500 mg by mouth daily.   BICTEGRAVIR-EMTRICITABINE-TENOFOVIR AF (BIKTARVY) 50-200-25 MG TABS TABLET    Take 1 tablet by mouth daily.   DIGESTIVE ENZYMES (DIGESTIVE ENZYME PO)    Take by mouth.   FLUTICASONE (FLONASE) 50 MCG/ACT NASAL SPRAY    USE TWO SPRAY(S) IN EACH NOSTRIL ONCE DAILY   FOLIC ACID (FOLVITE) 1 MG TABLET    Take 1 mg by mouth daily.   HUMIRA PEN 40 MG/0.4ML PNKT    SMARTSIG:40 Milligram(s) SUB-Q Every 2 Weeks   KRILL OIL OMEGA-3 PO    Take 1 each by mouth daily.   MELATONIN 5 MG CAPS    Take 1 each by mouth at bedtime.   MELOXICAM (MOBIC) 15 MG TABLET    Take 1 tablet (15 mg total) by mouth daily.   METHOTREXATE 2.5 MG TABLET    Take 20 mg by mouth once a week.   MISC NATURAL PRODUCTS (OSTEO BI-FLEX ADV JOINT SHIELD) TABS    Take 1 each by mouth daily.   MULTIPLE VITAMINS-MINERALS (CENTRUM SILVER ADULT 50+ PO)    Take 1 each by mouth daily.   MULTIPLE VITAMINS-MINERALS (PRESERVISION AREDS PO)       OVER THE COUNTER MEDICATION    Systane Balance Opht-Apply 1 drop to each eye daily.    PANTOPRAZOLE (PROTONIX) 40 MG TABLET    Take 40 mg by mouth daily.   PREGABALIN (LYRICA) 100 MG CAPSULE    Take 1 tablet by mouth daily.   PREGABALIN (LYRICA) 25 MG CAPSULE    take one capsule po q am   PROBIOTIC PRODUCT (PROBIOTIC-10 PO)    Take by mouth.   PROPYLENE GLYCOL (SYSTANE COMPLETE OP)       VALACYCLOVIR (VALTREX) 500 MG TABLET    Take 1 tablet (500 mg total) by mouth 2 (two) times daily. Take 1 tablet (500 mg total) by mouth 2 (two) times daily as needed.  Modified Medications   No medications on file  Discontinued Medications   No medications on file    Subjective: Craig Morales is seen on a work in basis.  He had recent umbilical hernia surgery and has become more concerned about increase in abdominal fat.  He has not gained any weight in the last few years but he has been exercising much less.  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  He gets very disrupted sleep at night because he is caring for his dog who has had diabetes for the last 3 years as well  as Cushing's disease.  Review of Systems: Review of Systems  Constitutional:  Negative for weight loss.    Past Medical History:  Diagnosis Date   GERD (gastroesophageal reflux disease)    HIV (human immunodeficiency virus infection) (HCC)    Seasonal allergies     Social History   Tobacco Use   Smoking status: Never   Smokeless tobacco: Never  Substance Use Topics   Alcohol use: Yes    Alcohol/week: 7.0 standard drinks of alcohol    Types: 7 Standard drinks or equivalent per week    Comment: occasional   Drug use: No    Family History  Problem Relation Age of Onset   Bleeding Disorder Neg Hx     Allergies  Allergen Reactions   Oxycontin [Oxycodone Hcl] Shortness Of Breath and Palpitations   Hydrocodone     REACTION: SOB and chest pain   Iodine Diarrhea and Nausea And Vomiting    In high doses.    Health Maintenance  Topic Date Due   Zoster Vaccines- Shingrix (1 of 2) Never done    COVID-19 Vaccine (7 - Pfizer risk series) 05/31/2022   INFLUENZA VACCINE  07/08/2022   TETANUS/TDAP  10/08/2023   COLONOSCOPY (Pts 45-33yrs Insurance coverage will need to be confirmed)  04/07/2027   Pneumonia Vaccine 55+ Years old  Completed   Hepatitis C Screening  Completed   HPV VACCINES  Aged Out    Objective:  Vitals:   07/30/22 0839  BP: 109/75  Pulse: (!) 101  Resp: 16  SpO2: 95%  Weight: 164 lb (74.4 kg)  Height: 5\' 11"  (1.803 m)   Body mass index is 22.87 kg/m.  Physical Exam Constitutional:      Comments: His weight is stable.  Abdominal:     General: There is distension.     Comments: He does have evidence of lipodystrophy with fat accumulation in his abdomen and relative lipo atrophy his extremities.  Psychiatric:        Mood and Affect: Mood normal.     Lab Results Lab Results  Component Value Date   WBC 6.7 12/04/2021   HGB 14.3 12/04/2021   HCT 41.8 12/04/2021   MCV 94.8 12/04/2021   PLT 201 12/04/2021    Lab Results  Component Value Date   CREATININE 1.00 12/04/2021   BUN 20 12/04/2021   NA 140 12/04/2021   K 4.1 12/04/2021   CL 106 12/04/2021   CO2 27 12/04/2021    Lab Results  Component Value Date   ALT 45 12/04/2021   AST 45 (H) 12/04/2021   ALKPHOS 50 09/27/2021   BILITOT 0.5 12/04/2021    Lab Results  Component Value Date   CHOL 189 12/04/2021   HDL 63 12/04/2021   LDLCALC 101 (H) 12/04/2021   TRIG 151 (H) 12/04/2021   CHOLHDL 3.0 12/04/2021   Lab Results  Component Value Date   LABRPR NON-REACTIVE 12/04/2021   HIV 1 RNA Quant  Date Value  12/04/2021 Not Detected Copies/mL  12/04/2020 <20 Copies/mL (H)  11/17/2019 26 copies/mL (H)   CD4 T Cell Abs (/uL)  Date Value  12/04/2021 726  12/04/2020 669  11/17/2019 768     Problem List Items Addressed This Visit       Unprioritized   Lipodystrophy    I reviewed the nature of HIV-associated lipodystrophy with him.  I do not think 14/09/2019 is components are causing  the problem and I do not think a switch in antiretroviral  therapy will be useful.  I do not think he is a candidate for metformin and I do not think that a graft injections offer enough potential benefit to warrant inconvenience and potential risk.  I encouraged him to regulate his sleep, reduce his work hours and get more exercise.         Cliffton Asters, MD [facility] Infectious Disease [facility] [phone] pager   [phone] cell 07/30/2022, 9:07 AM

## 2022-08-25 DIAGNOSIS — M47812 Spondylosis without myelopathy or radiculopathy, cervical region: Secondary | ICD-10-CM | POA: Diagnosis not present

## 2022-08-25 DIAGNOSIS — M5412 Radiculopathy, cervical region: Secondary | ICD-10-CM | POA: Diagnosis not present

## 2022-09-05 DIAGNOSIS — D3131 Benign neoplasm of right choroid: Secondary | ICD-10-CM | POA: Diagnosis not present

## 2022-09-05 DIAGNOSIS — H04123 Dry eye syndrome of bilateral lacrimal glands: Secondary | ICD-10-CM | POA: Diagnosis not present

## 2022-09-05 DIAGNOSIS — H2513 Age-related nuclear cataract, bilateral: Secondary | ICD-10-CM | POA: Diagnosis not present

## 2022-09-05 DIAGNOSIS — H52223 Regular astigmatism, bilateral: Secondary | ICD-10-CM | POA: Diagnosis not present

## 2022-09-05 DIAGNOSIS — H524 Presbyopia: Secondary | ICD-10-CM | POA: Diagnosis not present

## 2022-09-05 DIAGNOSIS — H353131 Nonexudative age-related macular degeneration, bilateral, early dry stage: Secondary | ICD-10-CM | POA: Diagnosis not present

## 2022-10-03 ENCOUNTER — Encounter: Payer: Self-pay | Admitting: Family Medicine

## 2022-10-03 ENCOUNTER — Ambulatory Visit (INDEPENDENT_AMBULATORY_CARE_PROVIDER_SITE_OTHER): Payer: BC Managed Care – PPO | Admitting: Family Medicine

## 2022-10-03 VITALS — BP 108/70 | HR 61 | Temp 98.2°F | Ht 71.0 in | Wt 167.4 lb

## 2022-10-03 DIAGNOSIS — Z125 Encounter for screening for malignant neoplasm of prostate: Secondary | ICD-10-CM

## 2022-10-03 DIAGNOSIS — Z0000 Encounter for general adult medical examination without abnormal findings: Secondary | ICD-10-CM

## 2022-10-03 LAB — CBC
HCT: 43.7 % (ref 39.0–52.0)
Hemoglobin: 14.7 g/dL (ref 13.0–17.0)
MCHC: 33.6 g/dL (ref 30.0–36.0)
MCV: 95.2 fl (ref 78.0–100.0)
Platelets: 237 10*3/uL (ref 150.0–400.0)
RBC: 4.6 Mil/uL (ref 4.22–5.81)
RDW: 14.8 % (ref 11.5–15.5)
WBC: 6.8 10*3/uL (ref 4.0–10.5)

## 2022-10-03 LAB — COMPREHENSIVE METABOLIC PANEL
ALT: 21 U/L (ref 0–53)
AST: 24 U/L (ref 0–37)
Albumin: 4.3 g/dL (ref 3.5–5.2)
Alkaline Phosphatase: 80 U/L (ref 39–117)
BUN: 23 mg/dL (ref 6–23)
CO2: 31 mEq/L (ref 19–32)
Calcium: 9.3 mg/dL (ref 8.4–10.5)
Chloride: 101 mEq/L (ref 96–112)
Creatinine, Ser: 1.03 mg/dL (ref 0.40–1.50)
GFR: 75.64 mL/min (ref >60.00)
Glucose, Bld: 81 mg/dL (ref 70–99)
Potassium: 4.4 mEq/L (ref 3.5–5.1)
Sodium: 137 mEq/L (ref 135–145)
Total Bilirubin: 0.7 mg/dL (ref 0.2–1.2)
Total Protein: 7 g/dL (ref 6.0–8.3)

## 2022-10-03 LAB — LIPID PANEL
Cholesterol: 175 mg/dL (ref 0–200)
HDL: 59.3 mg/dL (ref >39.00)
LDL Cholesterol: 98 mg/dL (ref 0–99)
NonHDL: 115.98
Total CHOL/HDL Ratio: 3
Triglycerides: 90 mg/dL (ref 0.0–149.0)
VLDL: 18 mg/dL (ref 0.0–40.0)

## 2022-10-03 LAB — PSA: PSA: 1 ng/mL (ref 0.10–4.00)

## 2022-10-03 NOTE — Progress Notes (Signed)
Chief Complaint  Patient presents with   Annual Exam    Well Male Craig Morales is here for a complete physical.   His last physical was >1 year ago.  Current diet: in general, a "healthy" diet.   Current exercise: some stretching, wt resistance exercise Weight trend: stable Fatigue out of ordinary? No. Seat belt? Yes.   Advanced directive? No  Health maintenance Shingrix- No Colonoscopy- Yes Tetanus- Yes Hep C- Yes Pneumonia vaccine- Yes  Past Medical History:  Diagnosis Date   GERD (gastroesophageal reflux disease)    HIV (human immunodeficiency virus infection) ([place])    Seasonal allergies      Past Surgical History:  Procedure Laterality Date   KNEE SURGERY     NASAL SEPTUM SURGERY      Medications  Current Outpatient Medications on File Prior to Visit  Medication Sig Dispense Refill   Ascorbic Acid (VITAMIN C WITH ROSE HIPS) 500 MG tablet Take 500 mg by mouth daily.     bictegravir-emtricitabine-tenofovir AF (BIKTARVY) 50-200-25 MG TABS tablet Take 1 tablet by mouth daily. 90 tablet 3   Digestive Enzymes (DIGESTIVE ENZYME PO) Take by mouth.     fluticasone (FLONASE) 50 MCG/ACT nasal spray USE TWO SPRAY(S) IN EACH NOSTRIL ONCE DAILY 16 g 6   folic acid (FOLVITE) 1 MG tablet Take 1 mg by mouth daily.     HUMIRA PEN 40 MG/0.4ML PNKT SMARTSIG:40 Milligram(s) SUB-Q Every 2 Weeks     KRILL OIL OMEGA-3 PO Take 1 each by mouth daily.     Melatonin 5 MG CAPS Take 1 each by mouth at bedtime.     meloxicam (MOBIC) 15 MG tablet Take 1 tablet (15 mg total) by mouth daily. 30 tablet 0   methotrexate 2.5 MG tablet Take 20 mg by mouth once a week.     Misc Natural Products (OSTEO BI-FLEX ADV JOINT SHIELD) TABS Take 1 each by mouth daily.     Multiple Vitamins-Minerals (CENTRUM SILVER ADULT 50+ PO) Take 1 each by mouth daily.     Multiple Vitamins-Minerals (PRESERVISION AREDS PO)      pantoprazole (PROTONIX) 40 MG tablet Take 40 mg by mouth daily.     pregabalin (LYRICA) 100 MG  capsule Take 1 tablet by mouth daily.     pregabalin (LYRICA) 25 MG capsule take one capsule po q am     Probiotic Product (PROBIOTIC-10 PO) Take by mouth.     Propylene Glycol (SYSTANE COMPLETE OP)      valACYclovir (VALTREX) 500 MG tablet Take 1 tablet (500 mg total) by mouth 2 (two) times daily. Take 1 tablet (500 mg total) by mouth 2 (two) times daily as needed. 90 tablet 3   Allergies Allergies  Allergen Reactions   Oxycontin [Oxycodone Hcl] Shortness Of Breath and Palpitations   Hydrocodone     REACTION: SOB and chest pain   Iodine Diarrhea and Nausea And Vomiting    In high doses.    Family History Family History  Problem Relation Age of Onset   Bleeding Disorder Neg Hx     Review of Systems: Constitutional:  no fevers Eye:  no recent significant change in vision Ears:  No changes in hearing Nose/Mouth/Throat:  no complaints of nasal congestion, no sore throat Cardiovascular: no chest pain Respiratory:  No shortness of breath Gastrointestinal:  No change in bowel habits GU:  No frequency Integumentary:  no abnormal skin lesions reported Neurologic:  no headaches Endocrine:  denies unexplained weight changes  Exam BP 108/70 (BP Location: Left Arm, Patient Position: Sitting, Cuff Size: Normal)   Pulse 61   Temp 98.2 F (36.8 C) (Oral)   Ht 5\' 11"  (1.803 m)   Wt 167 lb 6 oz (75.9 kg)   SpO2 94%   BMI 23.34 kg/m  General:  well developed, well nourished, in no apparent distress Skin:  no significant moles, warts, or growths Head:  no masses, lesions, or tenderness Eyes:  pupils equal and round, sclera anicteric without injection Ears:  canals without lesions, TMs shiny without retraction, no obvious effusion, no erythema Nose:  nares patent, mucosa normal Throat/Pharynx:  lips and gingiva without lesion; tongue and uvula midline; non-inflamed pharynx; no exudates or postnasal drainage Lungs:  clear to auscultation, breath sounds equal bilaterally, no respiratory  distress Cardio:  regular rate and rhythm, no LE edema or bruits Rectal: Deferred GI: BS+, S, NT, ND, no masses or organomegaly Musculoskeletal:  symmetrical muscle groups noted without atrophy or deformity Neuro:  gait normal; deep tendon reflexes normal and symmetric Psych: well oriented with normal range of affect and appropriate judgment/insight  Assessment and Plan  Well adult exam - Plan: CBC, Comprehensive metabolic panel, Lipid panel  Screening for prostate cancer - Plan: PSA   Well 66 y.o. male. Counseled on diet and exercise. Advanced directive form provided today.  Shingrix rec'd.  Other orders as above. Would like [facility].  Follow up in 1 yr or prn.  The patient voiced understanding and agreement to the plan.  Kathi Ludwig [place], DO 10/03/22 8:59 AM

## 2022-10-03 NOTE — Patient Instructions (Addendum)
Give [place] 2-3 business days to get the results of your labs back.   Keep the diet clean and stay active.  Please get me a copy of your advanced directive form at your convenience.   Sleep is important to [place] all. Getting good sleep is imperative to adequate functioning during the day. Work with our counselors who are trained to help people obtain quality sleep. Call [phone] to schedule an appointment or if you are curious about insurance coverage/cost.  Sleep Hygiene Tips: Do not watch TV or look at screens within 1 hour of going to bed. If you do, make sure there is a blue light filter (nighttime mode) involved. Try to go to bed around the same time every night. Wake up at the same time within 1 hour of regular time. Ex: If you wake up at 7 AM for work, do not sleep past 8 AM on days that you don't work. Do not drink alcohol before bedtime. Do not consume caffeine-containing beverages after noon or within 9 hours of intended bedtime. Get regular exercise/physical activity in your life, but not within 2 hours of planned bedtime. Do not take naps.  Do not eat within 2 hours of planned bedtime. Melatonin, 3-5 mg 30-60 minutes before planned bedtime may be helpful.  The bed should be for sleep or sex only. If after 20-30 minutes you are unable to fall asleep, get up and do something relaxing. Do this until you feel ready to go to sleep again.   Foods that may reduce pain: 1) Ginger 2) Blueberries 3) Salmon 4) Pumpkin seeds 5) dark chocolate 6) turmeric 7) tart cherries 8) virgin olive oil 9) chilli peppers 10) mint 11) krill oil  Let us know if you need anything.

## 2022-10-07 ENCOUNTER — Encounter: Payer: Self-pay | Admitting: Family Medicine

## 2022-10-13 ENCOUNTER — Encounter: Payer: Self-pay | Admitting: Family Medicine

## 2022-10-14 DIAGNOSIS — Z79899 Other long term (current) drug therapy: Secondary | ICD-10-CM | POA: Diagnosis not present

## 2022-10-14 DIAGNOSIS — M1180 Other specified crystal arthropathies, unspecified site: Secondary | ICD-10-CM | POA: Diagnosis not present

## 2022-10-14 DIAGNOSIS — M25431 Effusion, right wrist: Secondary | ICD-10-CM | POA: Diagnosis not present

## 2022-10-14 DIAGNOSIS — M069 Rheumatoid arthritis, unspecified: Secondary | ICD-10-CM | POA: Diagnosis not present

## 2022-11-24 DIAGNOSIS — M5412 Radiculopathy, cervical region: Secondary | ICD-10-CM | POA: Diagnosis not present

## 2022-12-19 ENCOUNTER — Other Ambulatory Visit: Payer: Self-pay

## 2022-12-19 ENCOUNTER — Other Ambulatory Visit: Payer: BC Managed Care – PPO

## 2022-12-19 ENCOUNTER — Other Ambulatory Visit: Payer: Self-pay | Admitting: Internal Medicine

## 2022-12-19 DIAGNOSIS — B20 Human immunodeficiency virus [HIV] disease: Secondary | ICD-10-CM | POA: Diagnosis not present

## 2022-12-19 DIAGNOSIS — Z79899 Other long term (current) drug therapy: Secondary | ICD-10-CM | POA: Diagnosis not present

## 2022-12-22 LAB — COMPREHENSIVE METABOLIC PANEL
AG Ratio: 1.8 (calc) (ref 1.0–2.5)
ALT: 19 U/L (ref 9–46)
AST: 23 U/L (ref 10–35)
Albumin: 4.2 g/dL (ref 3.6–5.1)
Alkaline phosphatase (APISO): 68 U/L (ref 35–144)
BUN: 19 mg/dL (ref 7–25)
CO2: 27 mmol/L (ref 20–32)
Calcium: 9.2 mg/dL (ref 8.6–10.3)
Chloride: 105 mmol/L (ref 98–110)
Creat: 0.99 mg/dL (ref 0.70–1.35)
Globulin: 2.4 g/dL (calc) (ref 1.9–3.7)
Glucose, Bld: 86 mg/dL (ref 65–99)
Potassium: 4.2 mmol/L (ref 3.5–5.3)
Sodium: 140 mmol/L (ref 135–146)
Total Bilirubin: 0.4 mg/dL (ref 0.2–1.2)
Total Protein: 6.6 g/dL (ref 6.1–8.1)

## 2022-12-22 LAB — CBC
HCT: 43.2 % (ref 38.5–50.0)
Hemoglobin: 14.6 g/dL (ref 13.2–17.1)
MCH: 32.2 pg (ref 27.0–33.0)
MCHC: 33.8 g/dL (ref 32.0–36.0)
MCV: 95.4 fL (ref 80.0–100.0)
MPV: 9.9 fL (ref 7.5–12.5)
Platelets: 233 10*3/uL (ref 140–400)
RBC: 4.53 10*6/uL (ref 4.20–5.80)
RDW: 15.2 % — ABNORMAL HIGH (ref 11.0–15.0)
WBC: 6.4 10*3/uL (ref 3.8–10.8)

## 2022-12-22 LAB — HIV-1 RNA QUANT-NO REFLEX-BLD
HIV 1 RNA Quant: NOT DETECTED Copies/mL
HIV-1 RNA Quant, Log: NOT DETECTED Log cps/mL

## 2022-12-22 LAB — T-HELPER CELLS (CD4) COUNT (NOT AT ARMC)
Absolute CD4: 898 cells/uL (ref 490–1740)
CD4 T Helper %: 34 % (ref 30–61)
Total lymphocyte count: 2650 cells/uL (ref 850–3900)

## 2022-12-22 LAB — LIPID PANEL
Cholesterol: 176 mg/dL (ref <200)
HDL: 62 mg/dL (ref >=40)
LDL Cholesterol (Calc): 90 mg/dL (calc)
Non-HDL Cholesterol (Calc): 114 mg/dL (calc) (ref <130)
Total CHOL/HDL Ratio: 2.8 (calc) (ref <5.0)
Triglycerides: 147 mg/dL (ref <150)

## 2022-12-22 LAB — RPR: RPR Ser Ql: NONREACTIVE

## 2023-01-01 ENCOUNTER — Ambulatory Visit: Payer: BC Managed Care – PPO | Admitting: Internal Medicine

## 2023-01-06 ENCOUNTER — Ambulatory Visit: Payer: BC Managed Care – PPO | Admitting: Internal Medicine

## 2023-01-06 ENCOUNTER — Encounter: Payer: Self-pay | Admitting: Internal Medicine

## 2023-01-06 ENCOUNTER — Other Ambulatory Visit: Payer: Self-pay

## 2023-01-06 DIAGNOSIS — B20 Human immunodeficiency virus [HIV] disease: Secondary | ICD-10-CM

## 2023-01-06 MED ORDER — BIKTARVY 50-200-25 MG PO TABS: 1.0000 | ORAL_TABLET | Freq: Every day | ORAL | 3 refills | Status: DC

## 2023-01-06 NOTE — Progress Notes (Signed)
Patient Active Problem List   Diagnosis Date Noted   Human immunodeficiency virus (HIV) disease ([place]) 02/12/2007    Priority: High   Rheumatoid arthritis ([place]) 04/14/2016    Priority: Medium    History of calcium pyrophosphate deposition disease (CPPD) 07/25/2013    Priority: Medium    Lipodystrophy 07/30/2022   Visit for preventive health examination 10/07/2013   Gout 10/07/2013   Degenerative arthritis of spine 11/27/2011   EPISTAXIS, RECURRENT 11/26/2010   IRRITABLE BOWEL SYNDROME 05/23/2008   HERPES LABIALIS 02/23/2007   GERD 02/23/2007   HERPES ZOSTER, UNCOMPLICATED 00/86/7619   DEVIATED NASAL SEPTUM 02/12/2007   ALLERGIC RHINITIS 02/12/2007   PSORIASIS 02/12/2007   DIARRHEA 02/12/2007   KNEE INJURY 02/12/2007    Patient's Medications  New Prescriptions   No medications on file  Previous Medications   ASCORBIC ACID (VITAMIN C WITH ROSE HIPS) 500 MG TABLET    Take 500 mg by mouth daily.   DIGESTIVE ENZYMES (DIGESTIVE ENZYME PO)    Take by mouth.   FLUTICASONE (FLONASE) 50 MCG/ACT NASAL SPRAY    USE TWO SPRAY(S) IN EACH NOSTRIL ONCE DAILY   FOLIC ACID (FOLVITE) 1 MG TABLET    Take 1 mg by mouth daily.   HUMIRA PEN 40 MG/0.4ML PNKT    SMARTSIG:40 Milligram(s) SUB-Q Every 2 Weeks   KRILL OIL OMEGA-3 PO    Take 1 each by mouth daily.   MELATONIN 5 MG CAPS    Take 1 each by mouth at bedtime.   MELOXICAM (MOBIC) 15 MG TABLET    Take 1 tablet (15 mg total) by mouth daily.   METHOTREXATE 2.5 MG TABLET    Take 20 mg by mouth once a week.   MISC NATURAL PRODUCTS (OSTEO BI-FLEX ADV JOINT SHIELD) TABS    Take 1 each by mouth daily.   MULTIPLE VITAMINS-MINERALS (CENTRUM SILVER ADULT 50+ PO)    Take 1 each by mouth daily.   MULTIPLE VITAMINS-MINERALS (PRESERVISION AREDS PO)       PANTOPRAZOLE (PROTONIX) 40 MG TABLET    Take 40 mg by mouth daily.   PREGABALIN (LYRICA) 100 MG CAPSULE    Take 1 tablet by mouth daily.   PREGABALIN (LYRICA) 25 MG CAPSULE    take one capsule  po q am   PROBIOTIC PRODUCT (PROBIOTIC-10 PO)    Take by mouth.   PROPYLENE GLYCOL (SYSTANE COMPLETE OP)       VALACYCLOVIR (VALTREX) 500 MG TABLET    Take 1 tablet (500 mg total) by mouth 2 (two) times daily. Take 1 tablet (500 mg total) by mouth 2 (two) times daily as needed.  Modified Medications   Modified Medication Previous Medication   BICTEGRAVIR-EMTRICITABINE-TENOFOVIR AF (BIKTARVY) 50-200-25 MG TABS TABLET bictegravir-emtricitabine-tenofovir AF (BIKTARVY) 50-200-25 MG TABS tablet      Take 1 tablet by mouth daily.    Take 1 tablet by mouth daily.  Discontinued Medications   No medications on file    Subjective: Craig Morales is in for his routine HIV follow-up visit.  He is having problems obtaining, taking or tolerating his Biktarvy and only very rarely misses a dose.  He thinks he is only missed 1-2 doses in the past year.  He is feeling well.  He continues to work from home.  He plans to work for probably another 2 years.  His dog died shortly after his visit here in August.  He is still grieving somewhat.  He is going  to the gym more frequently and trying to work out more regularly and eating healthy.  Review of Systems: Review of Systems  Constitutional:  Negative for malaise/fatigue and weight loss.  Psychiatric/Behavioral:  Negative for depression.     Past Medical History:  Diagnosis Date   GERD (gastroesophageal reflux disease)    HIV (human immunodeficiency virus infection) ([place])    Seasonal allergies     Social History   Tobacco Use   Smoking status: Never   Smokeless tobacco: Never  Substance Use Topics   Alcohol use: Yes    Alcohol/week: 7.0 standard drinks of alcohol    Types: 7 Standard drinks or equivalent per week    Comment: occasional   Drug use: No    Family History  Problem Relation Age of Onset   Bleeding Disorder Neg Hx     Allergies  Allergen Reactions   Oxycontin [Oxycodone Hcl] Shortness Of Breath and Palpitations   Hydrocodone      REACTION: SOB and chest pain   Iodine Diarrhea and Nausea And Vomiting    In high doses.    Health Maintenance  Topic Date Due   COVID-19 Vaccine (8 - 2023-24 season) 01/22/2023 (Originally 11/15/2022)   Zoster Vaccines- Shingrix (1 of 2) 04/04/2023 (Originally 02/24/1975)   DTaP/Tdap/Td (2 - Td or Tdap) 10/08/2023   COLONOSCOPY (Pts 45-46yrs Insurance coverage will need to be confirmed)  04/07/2027   Pneumonia Vaccine 10+ Years old  Completed   INFLUENZA VACCINE  Completed   Hepatitis C Screening  Completed   HPV VACCINES  Aged Out    Objective:  Vitals:   01/06/23 1541  BP: 126/80  Pulse: 61  Resp: 16  SpO2: 100%  Weight: 164 lb 6.4 oz (74.6 kg)  Height: 5\' 11"  (1.803 m)   Body mass index is 22.93 kg/m.  Physical Exam Constitutional:      Comments: He is talkative and in good spirits as usual.  Cardiovascular:     Rate and Rhythm: Normal rate.  Pulmonary:     Effort: Pulmonary effort is normal.  Psychiatric:        Mood and Affect: Mood normal.     Lab Results Lab Results  Component Value Date   WBC 6.4 12/19/2022   HGB 14.6 12/19/2022   HCT 43.2 12/19/2022   MCV 95.4 12/19/2022   PLT 233 12/19/2022    Lab Results  Component Value Date   CREATININE 0.99 12/19/2022   BUN 19 12/19/2022   NA 140 12/19/2022   K 4.2 12/19/2022   CL 105 12/19/2022   CO2 27 12/19/2022    Lab Results  Component Value Date   ALT 19 12/19/2022   AST 23 12/19/2022   ALKPHOS 80 10/03/2022   BILITOT 0.4 12/19/2022    Lab Results  Component Value Date   CHOL 176 12/19/2022   HDL 62 12/19/2022   LDLCALC 90 12/19/2022   TRIG 147 12/19/2022   CHOLHDL 2.8 12/19/2022   Lab Results  Component Value Date   LABRPR NON-REACTIVE 12/19/2022   HIV 1 RNA Quant (Copies/mL)  Date Value  12/19/2022 Not Detected  12/04/2021 Not Detected  12/04/2020 <20 (H)   CD4 T Cell Abs (/uL)  Date Value  12/04/2021 726  12/04/2020 669  11/17/2019 768     Problem List Items Addressed  This Visit       High   Human immunodeficiency virus (HIV) disease ([place])    His infection remains under excellent, long-term control.  He will continue Biktarvy and follow-up here after lab work in 1 year.      Relevant Medications   bictegravir-emtricitabine-tenofovir AF (BIKTARVY) 50-200-25 MG TABS tablet   Other Relevant Orders   CBC   T-helper cells (CD4) count (not at [facility])   Comprehensive metabolic panel   Lipid panel   RPR   HIV-1 RNA quant-no reflex-bld      Michel Bickers, MD [facility] [place] [phone] pager   [phone] cell 01/06/2023, 4:32 PM

## 2023-01-06 NOTE — Assessment & Plan Note (Signed)
His infection remains under excellent, long-term control.  He will continue Biktarvy and follow-up here after lab work in 1 year.

## 2023-01-15 DIAGNOSIS — M25431 Effusion, right wrist: Secondary | ICD-10-CM | POA: Diagnosis not present

## 2023-01-15 DIAGNOSIS — M1180 Other specified crystal arthropathies, unspecified site: Secondary | ICD-10-CM | POA: Diagnosis not present

## 2023-01-15 DIAGNOSIS — Z79899 Other long term (current) drug therapy: Secondary | ICD-10-CM | POA: Diagnosis not present

## 2023-01-15 DIAGNOSIS — M069 Rheumatoid arthritis, unspecified: Secondary | ICD-10-CM | POA: Diagnosis not present

## 2023-02-20 DIAGNOSIS — Z1211 Encounter for screening for malignant neoplasm of colon: Secondary | ICD-10-CM | POA: Diagnosis not present

## 2023-02-20 DIAGNOSIS — D128 Benign neoplasm of rectum: Secondary | ICD-10-CM | POA: Diagnosis not present

## 2023-02-20 DIAGNOSIS — K621 Rectal polyp: Secondary | ICD-10-CM | POA: Diagnosis not present

## 2023-02-20 DIAGNOSIS — K648 Other hemorrhoids: Secondary | ICD-10-CM | POA: Diagnosis not present

## 2023-03-03 DIAGNOSIS — M5412 Radiculopathy, cervical region: Secondary | ICD-10-CM | POA: Diagnosis not present

## 2023-03-03 DIAGNOSIS — M4802 Spinal stenosis, cervical region: Secondary | ICD-10-CM | POA: Diagnosis not present

## 2023-03-17 DIAGNOSIS — D225 Melanocytic nevi of trunk: Secondary | ICD-10-CM | POA: Diagnosis not present

## 2023-03-17 DIAGNOSIS — L821 Other seborrheic keratosis: Secondary | ICD-10-CM | POA: Diagnosis not present

## 2023-03-23 DIAGNOSIS — M5412 Radiculopathy, cervical region: Secondary | ICD-10-CM | POA: Diagnosis not present

## 2023-04-30 DIAGNOSIS — M069 Rheumatoid arthritis, unspecified: Secondary | ICD-10-CM | POA: Diagnosis not present

## 2023-04-30 DIAGNOSIS — M25431 Effusion, right wrist: Secondary | ICD-10-CM | POA: Diagnosis not present

## 2023-04-30 DIAGNOSIS — Z79899 Other long term (current) drug therapy: Secondary | ICD-10-CM | POA: Diagnosis not present

## 2023-04-30 DIAGNOSIS — M12811 Other specific arthropathies, not elsewhere classified, right shoulder: Secondary | ICD-10-CM | POA: Diagnosis not present

## 2023-06-16 ENCOUNTER — Telehealth: Payer: BC Managed Care – PPO | Admitting: Family Medicine

## 2023-06-16 ENCOUNTER — Encounter: Payer: Self-pay | Admitting: Family Medicine

## 2023-06-16 DIAGNOSIS — U071 COVID-19: Secondary | ICD-10-CM | POA: Diagnosis not present

## 2023-06-16 MED ORDER — NIRMATRELVIR/RITONAVIR (PAXLOVID)TABLET
3.00 | ORAL_TABLET | Freq: Two times a day (BID) | ORAL | 0 refills | Status: AC
Start: 2023-06-16 — End: 2023-06-21

## 2023-06-16 NOTE — Progress Notes (Signed)
Chief Complaint  Patient presents with   Covid Positive    Craig Morales here for URI complaints. We are interacting via web portal for an electronic face-to-face visit. I verified patient's ID using 2 identifiers. Patient agreed to proceed with visit via this method. Patient is at home, I am at office. Patient and I are present for visit.   Duration: 2 days  Associated symptoms: sinus congestion, sinus pain, rhinorrhea, sore throat, myalgia, and coughing Denies: itchy watery eyes, ear pain, ear drainage, wheezing, shortness of breath, and fevers, N/V/D, loss of taste/smell Treatment to date: Vit C, pushing fluids Sick contacts: Yes; some company that came over had covid Tested + for covid this AM.   Past Medical History:  Diagnosis Date   GERD (gastroesophageal reflux disease)    HIV (human immunodeficiency virus infection) ([facility])    Seasonal allergies     Objective No conversational dyspnea Age appropriate judgment and insight Nml affect and mood  COVID-19 - Plan: nirmatrelvir/ritonavir (PAXLOVID) 20 x 150 MG & 10 x 100MG  TABS  Discussed pro's/con's of Paxlovid. Pt wishes to proceed. Discussed case with pharmacy team and very low risk of causing any harm with the Biktarvy. Pt notified. Continue to push fluids, practice good hand hygiene, cover mouth when coughing. CDC quarantining guidelines discussed.  F/u prn. If starting to experience irreplaceable fluid loss, shaking, or shortness of breath, seek immediate care. Pt voiced understanding and agreement to the plan.  Jilda Roche [place], DO 06/16/23 12:08 PM

## 2023-06-19 DIAGNOSIS — Z86010 Personal history of colonic polyps: Secondary | ICD-10-CM | POA: Diagnosis not present

## 2023-06-19 DIAGNOSIS — K219 Gastro-esophageal reflux disease without esophagitis: Secondary | ICD-10-CM | POA: Diagnosis not present

## 2023-07-08 DIAGNOSIS — M4802 Spinal stenosis, cervical region: Secondary | ICD-10-CM | POA: Diagnosis not present

## 2023-07-08 DIAGNOSIS — M5412 Radiculopathy, cervical region: Secondary | ICD-10-CM | POA: Diagnosis not present

## 2023-07-27 DIAGNOSIS — M5412 Radiculopathy, cervical region: Secondary | ICD-10-CM | POA: Diagnosis not present

## 2023-07-31 DIAGNOSIS — R7989 Other specified abnormal findings of blood chemistry: Secondary | ICD-10-CM | POA: Insufficient documentation

## 2023-08-05 ENCOUNTER — Encounter: Payer: Self-pay | Admitting: Family Medicine

## 2023-08-06 DIAGNOSIS — M25431 Effusion, right wrist: Secondary | ICD-10-CM | POA: Diagnosis not present

## 2023-08-06 DIAGNOSIS — Z79899 Other long term (current) drug therapy: Secondary | ICD-10-CM | POA: Diagnosis not present

## 2023-08-06 DIAGNOSIS — M069 Rheumatoid arthritis, unspecified: Secondary | ICD-10-CM | POA: Diagnosis not present

## 2023-08-06 DIAGNOSIS — M12811 Other specific arthropathies, not elsewhere classified, right shoulder: Secondary | ICD-10-CM | POA: Diagnosis not present

## 2023-10-14 DIAGNOSIS — D3131 Benign neoplasm of right choroid: Secondary | ICD-10-CM | POA: Diagnosis not present

## 2023-10-14 DIAGNOSIS — H524 Presbyopia: Secondary | ICD-10-CM | POA: Diagnosis not present

## 2023-10-14 DIAGNOSIS — H2513 Age-related nuclear cataract, bilateral: Secondary | ICD-10-CM | POA: Diagnosis not present

## 2023-10-14 DIAGNOSIS — H04123 Dry eye syndrome of bilateral lacrimal glands: Secondary | ICD-10-CM | POA: Diagnosis not present

## 2023-10-14 DIAGNOSIS — H52223 Regular astigmatism, bilateral: Secondary | ICD-10-CM | POA: Diagnosis not present

## 2023-10-14 DIAGNOSIS — H353131 Nonexudative age-related macular degeneration, bilateral, early dry stage: Secondary | ICD-10-CM | POA: Diagnosis not present

## 2023-10-16 ENCOUNTER — Encounter: Payer: Self-pay | Admitting: Family Medicine

## 2023-10-16 ENCOUNTER — Ambulatory Visit (INDEPENDENT_AMBULATORY_CARE_PROVIDER_SITE_OTHER): Payer: BC Managed Care – PPO | Admitting: Family Medicine

## 2023-10-16 VITALS — BP 116/72 | HR 60 | Temp 98.0°F | Resp 16 | Ht 71.0 in | Wt 163.0 lb

## 2023-10-16 DIAGNOSIS — Z0000 Encounter for general adult medical examination without abnormal findings: Secondary | ICD-10-CM | POA: Diagnosis not present

## 2023-10-16 DIAGNOSIS — E611 Iron deficiency: Secondary | ICD-10-CM

## 2023-10-16 DIAGNOSIS — Z23 Encounter for immunization: Secondary | ICD-10-CM

## 2023-10-16 DIAGNOSIS — N529 Male erectile dysfunction, unspecified: Secondary | ICD-10-CM | POA: Diagnosis not present

## 2023-10-16 DIAGNOSIS — Z13220 Encounter for screening for lipoid disorders: Secondary | ICD-10-CM

## 2023-10-16 DIAGNOSIS — Z125 Encounter for screening for malignant neoplasm of prostate: Secondary | ICD-10-CM | POA: Diagnosis not present

## 2023-10-16 LAB — CBC
HCT: 47.5 % (ref 39.0–52.0)
Hemoglobin: 15.6 g/dL (ref 13.0–17.0)
MCHC: 32.8 g/dL (ref 30.0–36.0)
MCV: 99.4 fL (ref 78.0–100.0)
Platelets: 230 10*3/uL (ref 150.0–400.0)
RBC: 4.78 Mil/uL (ref 4.22–5.81)
RDW: 15 % (ref 11.5–15.5)
WBC: 6.2 10*3/uL (ref 4.0–10.5)

## 2023-10-16 LAB — PSA: PSA: 1.86 ng/mL (ref 0.10–4.00)

## 2023-10-16 LAB — LIPID PANEL
Cholesterol: 159 mg/dL (ref 0–200)
HDL: 57.4 mg/dL (ref >39.00)
LDL Cholesterol: 88 mg/dL (ref 0–99)
NonHDL: 101.77
Total CHOL/HDL Ratio: 3
Triglycerides: 70 mg/dL (ref 0.0–149.0)
VLDL: 14 mg/dL (ref 0.0–40.0)

## 2023-10-16 LAB — IBC + FERRITIN
Ferritin: 18.9 ng/mL — ABNORMAL LOW (ref 22.0–322.0)
Iron: 86 ug/dL (ref 42–165)
Saturation Ratios: 22.1 % (ref 20.0–50.0)
TIBC: 389.2 ug/dL (ref 250.0–450.0)
Transferrin: 278 mg/dL (ref 212.0–360.0)

## 2023-10-16 LAB — COMPREHENSIVE METABOLIC PANEL
ALT: 18 U/L (ref 0–53)
AST: 28 U/L (ref 0–37)
Albumin: 4.2 g/dL (ref 3.5–5.2)
Alkaline Phosphatase: 62 U/L (ref 39–117)
BUN: 16 mg/dL (ref 6–23)
CO2: 31 meq/L (ref 19–32)
Calcium: 9.3 mg/dL (ref 8.4–10.5)
Chloride: 102 meq/L (ref 96–112)
Creatinine, Ser: 1.16 mg/dL (ref 0.40–1.50)
GFR: 65.11 mL/min (ref >60.00)
Glucose, Bld: 81 mg/dL (ref 70–99)
Potassium: 5.1 meq/L (ref 3.5–5.1)
Sodium: 137 meq/L (ref 135–145)
Total Bilirubin: 0.8 mg/dL (ref 0.2–1.2)
Total Protein: 6.8 g/dL (ref 6.0–8.3)

## 2023-10-16 MED ORDER — SILDENAFIL CITRATE 100 MG PO TABS
50.0000 mg | ORAL_TABLET | Freq: Every day | ORAL | 1 refills | Status: AC | PRN
Start: 2023-10-16 — End: ?

## 2023-10-16 NOTE — Progress Notes (Signed)
Chief Complaint  Patient presents with   Annual Exam    Annual Exam    Well Male Craig Morales is here for a complete physical.   His last physical was >1 year ago.  Current diet: in general, a "healthy" diet.   Current exercise: lifting wts Weight trend: stable Fatigue out of ordinary? No. Seat belt? Yes.   Advanced directive? No  Health maintenance Shingrix- No Colonoscopy- Yes Tetanus- Due Hep C- Yes Lung cancer screening- Yes Pneumonia vaccine- Yes  Over the past year, pt has been having difficulty maintaining and attaining an erection. Has never tried a medication. Went on T. Libido improved, erections did not. Wanting to try something.   Past Medical History:  Diagnosis Date   GERD (gastroesophageal reflux disease)    HIV (human immunodeficiency virus infection) ([facility])    Seasonal allergies      Past Surgical History:  Procedure Laterality Date   KNEE SURGERY     NASAL SEPTUM SURGERY      Medications  Current Outpatient Medications on File Prior to Visit  Medication Sig Dispense Refill   Ascorbic Acid (VITAMIN C WITH ROSE HIPS) 500 MG tablet Take 500 mg by mouth daily.     bictegravir-emtricitabine-tenofovir AF (BIKTARVY) 50-200-25 MG TABS tablet Take 1 tablet by mouth daily. 90 tablet 3   Digestive Enzymes (DIGESTIVE ENZYME PO) Take by mouth.     fluticasone (FLONASE) 50 MCG/ACT nasal spray USE TWO SPRAY(S) IN EACH NOSTRIL ONCE DAILY 16 g 6   folic acid (FOLVITE) 1 MG tablet Take 1 mg by mouth daily.     KRILL OIL OMEGA-3 PO Take 1 each by mouth daily.     Melatonin 5 MG CAPS Take 1 each by mouth at bedtime.     methotrexate 2.5 MG tablet Take 20 mg by mouth once a week.     Misc Natural Products (OSTEO BI-FLEX ADV JOINT SHIELD) TABS Take 1 each by mouth daily.     Multiple Vitamins-Minerals (CENTRUM SILVER ADULT 50+ PO) Take 1 each by mouth daily.     Multiple Vitamins-Minerals (PRESERVISION AREDS PO)      pantoprazole (PROTONIX) 40 MG tablet Take 40 mg by  mouth daily.     pregabalin (LYRICA) 100 MG capsule Take 1 tablet by mouth daily.     pregabalin (LYRICA) 25 MG capsule take one capsule po q am     Probiotic Product (PROBIOTIC-10 PO) Take by mouth.     Propylene Glycol (SYSTANE COMPLETE OP)      valACYclovir (VALTREX) 500 MG tablet Take 1 tablet (500 mg total) by mouth 2 (two) times daily. Take 1 tablet (500 mg total) by mouth 2 (two) times daily as needed. 90 tablet 3    Allergies Allergies  Allergen Reactions   Oxycontin [Oxycodone Hcl] Shortness Of Breath and Palpitations   Hydrocodone     REACTION: SOB and chest pain   Iodine Diarrhea and Nausea And Vomiting    In high doses.    Family History Family History  Problem Relation Age of Onset   Bleeding Disorder Neg Hx     Review of Systems: Constitutional:  no fevers Eye:  no recent significant change in vision Ears:  No changes in hearing Nose/Mouth/Throat:  no complaints of nasal congestion, no sore throat Cardiovascular: no chest pain Respiratory:  No shortness of breath Gastrointestinal:  No change in bowel habits GU:  No frequency Integumentary:  no abnormal skin lesions reported Neurologic:  no headaches Endocrine:  denies unexplained weight changes  Exam BP 116/72 (BP Location: Left Arm, Patient Position: Sitting, Cuff Size: Normal)   Pulse 60   Temp 98 F (36.7 C) (Oral)   Resp 16   Ht 5\' 11"  (1.803 m)   Wt 163 lb (73.9 kg)   SpO2 96%   BMI 22.73 kg/m  General:  well developed, well nourished, in no apparent distress Skin:  no significant moles, warts, or growths Head:  no masses, lesions, or tenderness Eyes:  pupils equal and round, sclera anicteric without injection Ears:  canals without lesions, TMs shiny without retraction, no obvious effusion, no erythema Nose:  nares patent, mucosa normal Throat/Pharynx:  lips and gingiva without lesion; tongue and uvula midline; non-inflamed pharynx; no exudates or postnasal drainage Lungs:  clear to  auscultation, breath sounds equal bilaterally, no respiratory distress Cardio:  regular rate and rhythm, no LE edema or bruits Rectal: Deferred GI: BS+, S, NT, ND, no masses or organomegaly Musculoskeletal:  symmetrical muscle groups noted without atrophy or deformity Neuro:  gait normal; deep tendon reflexes normal and symmetric Psych: well oriented with normal range of affect and appropriate judgment/insight  Assessment and Plan  Well adult exam - Plan: Comprehensive metabolic panel, Lipid panel  Screening for prostate cancer - Plan: PSA  Erectile dysfunction, unspecified erectile dysfunction type - Plan: sildenafil (VIAGRA) 100 MG tablet   Well 67 y.o. male. Counseled on diet and exercise. Advanced directive form provided today.  ED: Will try sildenafil 50-100 mg every day prn. GoodRx rec'd.  Other orders as above. Follow up in 1 yr.  The patient voiced understanding and agreement to the plan.  Jilda Roche [place], DO 10/16/23 9:28 AM

## 2023-10-16 NOTE — Patient Instructions (Addendum)
Give [place] 2-3 business days to get the results of your labs back.   Keep the diet clean and stay active.  Please get me a copy of your advanced directive form at your convenience.   Use GoodRx for the sildenafil. Let me know if there are issues.   The Shingrix vaccine (for shingles) is a 2 shot series spaced 2-6 months apart. It can make people feel low energy, achy and almost like they have the flu for 48 hours after injection. 1/5 people can have nausea and/or vomiting. Please plan accordingly when deciding on when to get this shot. Call our office for a nurse visit appointment to get this. The second shot of the series is less severe regarding the side effects, but it still lasts 48 hours.   Let us know if you need anything.

## 2023-10-16 NOTE — Addendum Note (Signed)
Addended by: Kathi Ludwig on: 10/16/2023 09:40 AM   Modules accepted: Orders

## 2023-10-21 DIAGNOSIS — H04123 Dry eye syndrome of bilateral lacrimal glands: Secondary | ICD-10-CM | POA: Diagnosis not present

## 2023-10-21 DIAGNOSIS — H25813 Combined forms of age-related cataract, bilateral: Secondary | ICD-10-CM | POA: Diagnosis not present

## 2023-10-21 DIAGNOSIS — D3131 Benign neoplasm of right choroid: Secondary | ICD-10-CM | POA: Diagnosis not present

## 2023-10-21 DIAGNOSIS — H353131 Nonexudative age-related macular degeneration, bilateral, early dry stage: Secondary | ICD-10-CM | POA: Diagnosis not present

## 2023-10-28 DIAGNOSIS — M4802 Spinal stenosis, cervical region: Secondary | ICD-10-CM | POA: Diagnosis not present

## 2023-10-29 ENCOUNTER — Other Ambulatory Visit (HOSPITAL_BASED_OUTPATIENT_CLINIC_OR_DEPARTMENT_OTHER): Payer: Self-pay | Admitting: Neurosurgery

## 2023-10-29 DIAGNOSIS — M4802 Spinal stenosis, cervical region: Secondary | ICD-10-CM

## 2023-11-01 ENCOUNTER — Ambulatory Visit (HOSPITAL_BASED_OUTPATIENT_CLINIC_OR_DEPARTMENT_OTHER)
Admission: RE | Admit: 2023-11-01 | Discharge: 2023-11-01 | Disposition: A | Discharge status: Home / Self Care | Payer: BC Managed Care – PPO | Source: Ambulatory Visit | Attending: Neurosurgery | Admitting: Neurosurgery

## 2023-11-01 DIAGNOSIS — M4802 Spinal stenosis, cervical region: Secondary | ICD-10-CM | POA: Insufficient documentation

## 2023-11-01 DIAGNOSIS — M50221 Other cervical disc displacement at C4-C5 level: Secondary | ICD-10-CM | POA: Diagnosis not present

## 2023-11-01 DIAGNOSIS — M47812 Spondylosis without myelopathy or radiculopathy, cervical region: Secondary | ICD-10-CM | POA: Diagnosis not present

## 2023-11-01 DIAGNOSIS — M5021 Other cervical disc displacement,  high cervical region: Secondary | ICD-10-CM | POA: Diagnosis not present

## 2023-11-01 DIAGNOSIS — M50222 Other cervical disc displacement at C5-C6 level: Secondary | ICD-10-CM | POA: Diagnosis not present

## 2023-11-12 IMAGING — DX DG KNEE AP/LAT W/ SUNRISE*R*
3 series · 3 of 3 positions shown · non-contrast
Comparison: None.

CLINICAL DATA: Worsening right knee pain/swelling x 2 weeks.

EXAM:
RIGHT KNEE 3 VIEWS

[knee ap]
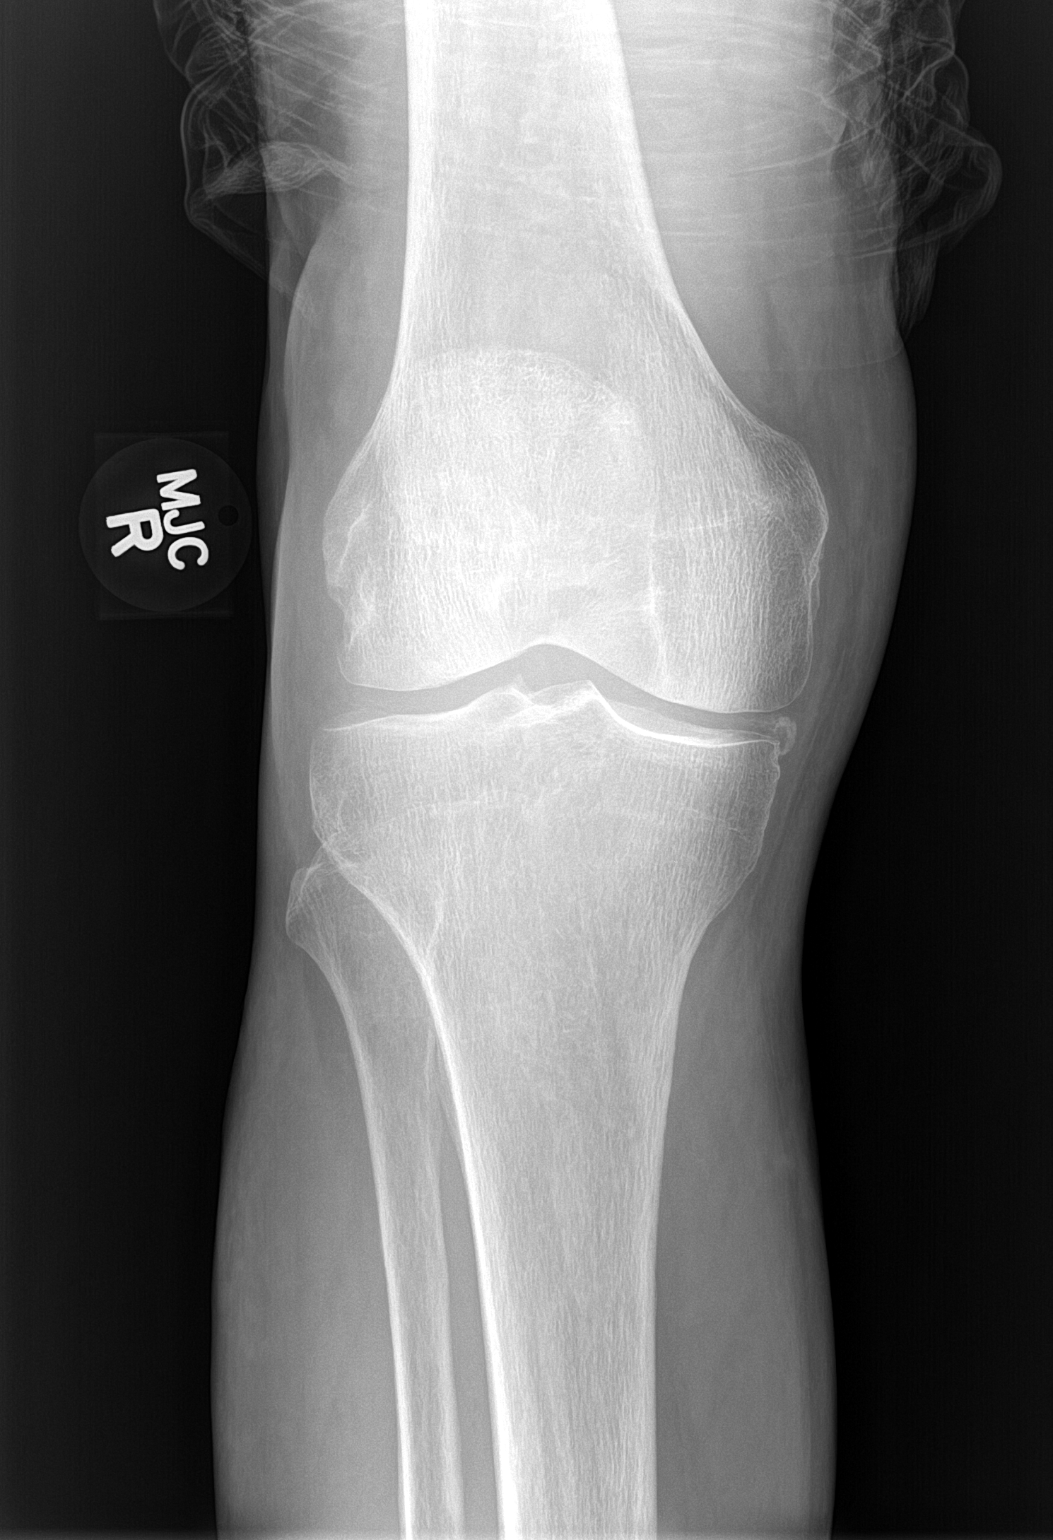

[knee lat]
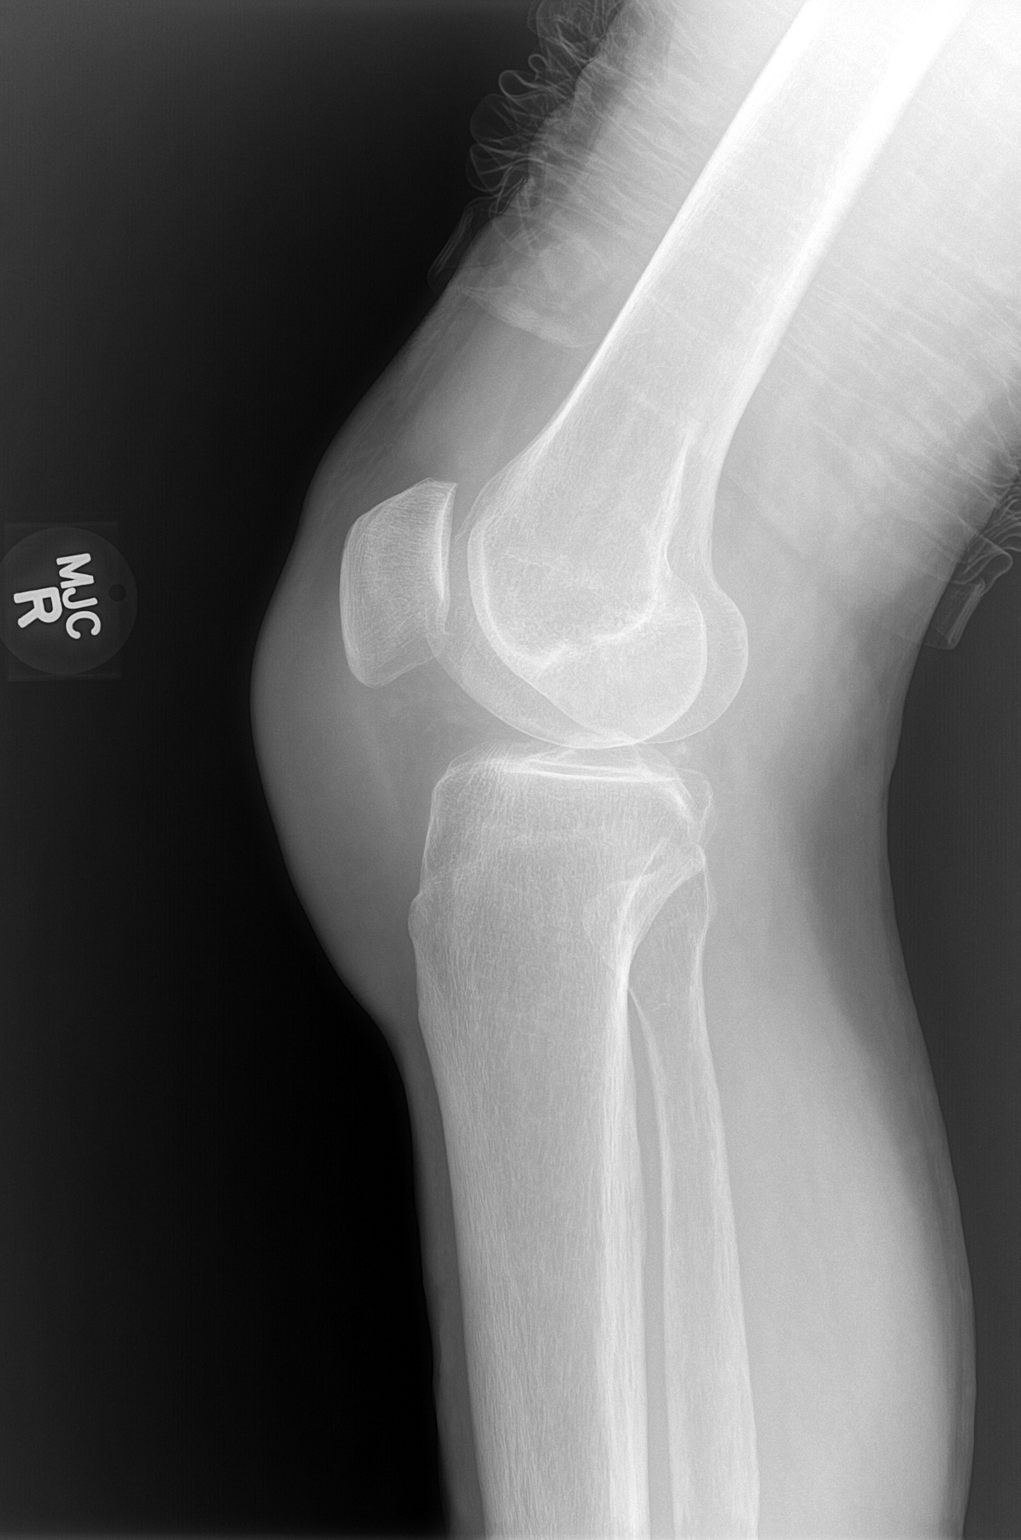

[patella]
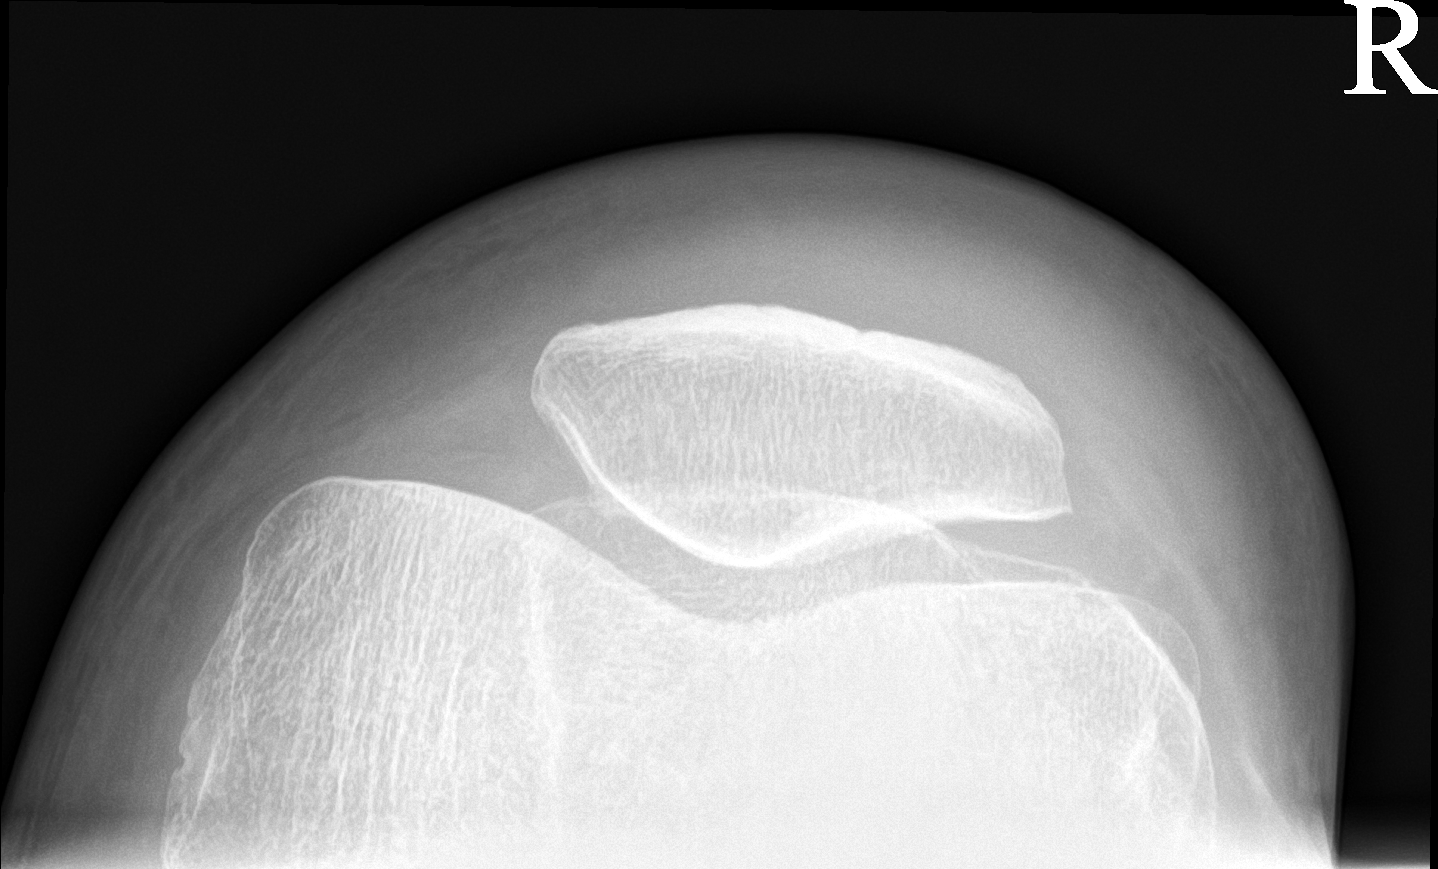

[3 of 3 positions shown; findings below may reference images not displayed]

FINDINGS: No recent fracture or dislocation is seen. Small bony spurs seen in
the patella. Chondrocalcinosis is noted in the medial compartment.
There is no significant effusion in the suprapatellar bursa. There
is soft tissue swelling anterior to the infrapatellar tendon
IMPRESSION: No recent fracture or dislocation is seen. Degenerative changes are
noted with bony spurs and chondrocalcinosis. There is soft tissue
swelling anterior to the infrapatellar tendon and patella suggesting
possible prepatellar bursitis. Less likely possibility would be
hematoma.

## 2023-12-16 ENCOUNTER — Ambulatory Visit: Payer: BC Managed Care – PPO | Admitting: Family

## 2023-12-16 VITALS — BP 133/77 | HR 49 | Temp 97.4°F | Resp 16 | Ht 71.0 in | Wt 159.0 lb

## 2023-12-16 DIAGNOSIS — H6692 Otitis media, unspecified, left ear: Secondary | ICD-10-CM | POA: Diagnosis not present

## 2023-12-16 DIAGNOSIS — H1032 Unspecified acute conjunctivitis, left eye: Secondary | ICD-10-CM | POA: Diagnosis not present

## 2023-12-16 MED ORDER — NEOMYCIN-POLYMYXIN-HC 3.5-10000-1 OP SUSP: 3.0000 [drp] | Freq: Three times a day (TID) | OPHTHALMIC | 0 refills | Status: AC

## 2023-12-16 MED ORDER — AMOXICILLIN 500 MG PO CAPS
500.0000 mg | ORAL_CAPSULE | Freq: Three times a day (TID) | ORAL | 0 refills | Status: DC
Start: 2023-12-16 — End: 2024-01-12

## 2023-12-16 NOTE — Progress Notes (Signed)
 .  Subjective:     Patient ID: Craig Morales, male    DOB: [birth year], 68 y.o.   MRN: [id number]  Chief Complaint  Patient presents with   Conjunctivitis    Patient complains of left red eye since 12/11/23   Nasal Congestion    Patient complains of nasal congestion that started 12/09/23    HPI  Discussed the use of AI scribe software for clinical note transcription with the patient, who gave verbal consent to proceed.  History of Present Illness   The patient, with a history of allergies, presents with nasal congestion and redness in the left eye. The symptoms began after a trip to [place] , where the patient's partner was diagnosed with pneumonia and bronchitis. The patient reports that the congestion started around the first of the month and has been persistent. The patient has been using allergy eye drops to manage the redness in the left eye, but the condition has not improved. The patient also reports occasional bleeding in the nasal cavity. The patient denies any significant coughing or sneezing. The patient's energy level remains normal despite the symptoms.          Health Maintenance Due  Topic Date Due   COVID-19 Vaccine (9 - 2024-25 season) 11/10/2023    Past Medical History:  Diagnosis Date   GERD (gastroesophageal reflux disease)    HIV (human immunodeficiency virus infection) (H[facility])    Seasonal allergies     Past Surgical History:  Procedure Laterality Date   KNEE SURGERY     NASAL SEPTUM SURGERY      Family History  Problem Relation Age of Onset   Bleeding Disorder Neg Hx     Social History   Socioeconomic History   Marital status: Married    Spouse name: Not on file   Number of children: Not on file   Years of education: Not on file   Highest education level: Not on file  Occupational History   Not on file  Tobacco Use   Smoking status: Never   Smokeless tobacco: Never  Substance and Sexual Activity   Alcohol use: Yes    Alcohol/week:  7.0 standard drinks of alcohol    Types: 7 Standard drinks or equivalent per week    Comment: occasional   Drug use: No   Sexual activity: Not Currently    Birth control/protection: None    Comment: given condoms  Other Topics Concern   Not on file  Social History Narrative   Not on file   Social Drivers of Health   Financial Resource Strain: Not on file  Food Insecurity: Not on file  Transportation Needs: Not on file  Physical Activity: Not on file  Stress: Not on file  Social Connections: Not on file  Intimate Partner Violence: Not on file    Outpatient Medications Prior to Visit  Medication Sig Dispense Refill   Ascorbic Acid (VITAMIN C WITH ROSE HIPS) 500 MG tablet Take 500 mg by mouth daily.     bictegravir-emtricitabine-tenofovir  AF (BIKTARVY ) 50-200-25 MG TABS tablet Take 1 tablet by mouth daily. 90 tablet 3   Digestive Enzymes (DIGESTIVE ENZYME PO) Take by mouth.     fluticasone  (FLONASE ) 50 MCG/ACT nasal spray USE TWO SPRAY(S) IN EACH NOSTRIL ONCE DAILY 16 g 6   folic acid  (FOLVITE ) 1 MG tablet Take 1 mg by mouth daily.     KRILL OIL OMEGA-3 PO Take 1 each by mouth daily.     Melatonin 5  MG CAPS Take 1 each by mouth at bedtime.     methotrexate  2.5 MG tablet Take 20 mg by mouth once a week.     Misc Natural Products (OSTEO BI-FLEX ADV JOINT SHIELD) TABS Take 1 each by mouth daily.     Multiple Vitamins-Minerals (CENTRUM SILVER ADULT 50+ PO) Take 1 each by mouth daily.     Multiple Vitamins-Minerals (PRESERVISION AREDS PO)      pantoprazole  (PROTONIX ) 40 MG tablet Take 40 mg by mouth daily.     pregabalin  (LYRICA ) 100 MG capsule Take 1 tablet by mouth daily.     pregabalin  (LYRICA ) 25 MG capsule take one capsule po q am     Probiotic Product (PROBIOTIC-10 PO) Take by mouth.     Propylene Glycol (SYSTANE COMPLETE OP)      sildenafil  (VIAGRA ) 100 MG tablet Take 0.5-1 tablets (50-100 mg total) by mouth daily as needed for erectile dysfunction. 30 tablet 1   valACYclovir   (VALTREX ) 500 MG tablet Take 1 tablet (500 mg total) by mouth 2 (two) times daily. Take 1 tablet (500 mg total) by mouth 2 (two) times daily as needed. 90 tablet 3   No facility-administered medications prior to visit.    Allergies  Allergen Reactions   Oxycontin  [Oxycodone  Hcl] Shortness Of Breath and Palpitations   Hydrocodone     REACTION: SOB and chest pain   Iodine Diarrhea and Nausea And Vomiting    In high doses.    ROS See HPI    Objective:    Physical Exam Constitutional:      General: He is not in acute distress.    Appearance: He is well-developed.  HENT:     Head: Normocephalic and atraumatic.     Right Ear: Tympanic membrane and ear canal normal.     Left Ear: Ear canal normal. Tympanic membrane is erythematous (mild).     Mouth/Throat:     Pharynx: No oropharyngeal exudate or posterior oropharyngeal erythema.  Eyes:     Conjunctiva/sclera:     Left eye: Left conjunctiva is injected.   Cardiovascular:     Rate and Rhythm: Normal rate and regular rhythm.     Heart sounds: No murmur heard. Pulmonary:     Effort: Pulmonary effort is normal. No respiratory distress.     Breath sounds: Normal breath sounds. No wheezing or rales.  Musculoskeletal:        General: No swelling.  Skin:    General: Skin is warm and dry.  Neurological:     Mental Status: He is alert and oriented to person, place, and time.  Psychiatric:        Behavior: Behavior normal.        Thought Content: Thought content normal.      BP 133/77 (BP Location: Right Arm, Patient Position: Sitting, Cuff Size: Small)   Pulse (!) 49   Temp (!) 97.4 F (36.3 C) (Oral)   Resp 16   Ht 5' 11 (1.803 m)   Wt 159 lb (72.1 kg)   SpO2 97%   BMI 22.18 kg/m  Wt Readings from Last 3 Encounters:  12/16/23 159 lb (72.1 kg)  10/16/23 163 lb (73.9 kg)  01/06/23 164 lb 6.4 oz (74.6 kg)       Assessment & Plan:   Problem List Items Addressed This Visit       Unprioritized   Left otitis media     One week of congestion, mild cough, and sneezing. No fever or  significant fatigue reported. Possible early otitis media noted on exam. -Prescribe Amoxicillin  500mg , three times daily for one week.      Relevant Medications   amoxicillin  (AMOXIL ) 500 MG capsule   Acute conjunctivitis of left eye - Primary    Redness and itchiness in both eyes, with a history of crusting. No pain reported. -Prescribe antibiotic eye drops, three times daily in both eyes.      Relevant Medications   neomycin -polymyxin-hydrocortisone (CORTISPORIN) 3.5-10000-1 ophthalmic suspension    I am having Brando A. Deviney start on neomycin -polymyxin-hydrocortisone and amoxicillin . I am also having him maintain his Melatonin, Osteo Bi-Flex Adv Joint Shield, Multiple Vitamins-Minerals (CENTRUM SILVER ADULT 50+ PO), vitamin C with rose hips, KRILL OIL OMEGA-3 PO, methotrexate , folic acid , fluticasone , pantoprazole , pregabalin , pregabalin , Digestive Enzymes (DIGESTIVE ENZYME PO), Probiotic Product (PROBIOTIC-10 PO), valACYclovir , Propylene Glycol (SYSTANE COMPLETE OP), Multiple Vitamins-Minerals (PRESERVISION AREDS PO), Biktarvy , and sildenafil .  Meds ordered this encounter  Medications   neomycin -polymyxin-hydrocortisone (CORTISPORIN) 3.5-10000-1 ophthalmic suspension    Sig: Place 3 drops into both eyes 3 (three) times daily.    Dispense:  7.5 mL    Refill:  0    Supervising Provider:   DOMENICA BLACKBIRD A [[id number]]   amoxicillin  (AMOXIL ) 500 MG capsule    Sig: Take 1 capsule (500 mg total) by mouth 3 (three) times daily.    Dispense:  21 capsule    Refill:  0    Supervising Provider:   DOMENICA BLACKBIRD A [[id number]]

## 2023-12-17 DIAGNOSIS — H1032 Unspecified acute conjunctivitis, left eye: Secondary | ICD-10-CM | POA: Insufficient documentation

## 2023-12-17 DIAGNOSIS — H6692 Otitis media, unspecified, left ear: Secondary | ICD-10-CM | POA: Insufficient documentation

## 2023-12-17 NOTE — Assessment & Plan Note (Signed)
  Redness and itchiness in both eyes, with a history of crusting. No pain reported. -Prescribe antibiotic eye drops, three times daily in both eyes.

## 2023-12-17 NOTE — Assessment & Plan Note (Signed)
  One week of congestion, mild cough, and sneezing. No fever or significant fatigue reported. Possible early otitis media noted on exam. -Prescribe Amoxicillin 500mg , three times daily for one week.

## 2023-12-17 NOTE — Patient Instructions (Signed)
 VISIT SUMMARY:  You visited the clinic today due to congestion and redness in your left eye, which started after a trip to [place] . Your partner was diagnosed with pneumonia and bronchitis during the trip. You have been using allergy eye drops without improvement and also reported occasional nasal bleeding. Despite these symptoms, your energy levels remain normal.  YOUR PLAN:  -CONJUNCTIVITIS: Conjunctivitis, also known as pink eye, is an inflammation or infection of the outer membrane of the eyeball and the inner eyelid. You have redness and itchiness in both eyes with a history of crusting but no pain. You have been prescribed antibiotic eye drops to be used three times daily in both eyes.  -UPPER RESPIRATORY INFECTION: An upper respiratory infection is a viral or bacterial infection affecting the nose, throat, and airways. You have had congestion, a mild cough, and sneezing for one week, with no fever or significant fatigue. It looks like you are developing an early ear infection in your left eye. You have been prescribed Amoxicillin  500mg  to be taken three times daily for one week.  INSTRUCTIONS:  If your symptoms worsen or do not improve, please contact the clinic for further evaluation.

## 2023-12-21 ENCOUNTER — Other Ambulatory Visit: Payer: Self-pay

## 2023-12-21 ENCOUNTER — Encounter: Payer: Self-pay | Admitting: Family Medicine

## 2023-12-21 DIAGNOSIS — Z113 Encounter for screening for infections with a predominantly sexual mode of transmission: Secondary | ICD-10-CM

## 2023-12-21 DIAGNOSIS — B20 Human immunodeficiency virus [HIV] disease: Secondary | ICD-10-CM

## 2023-12-22 ENCOUNTER — Other Ambulatory Visit: Payer: BC Managed Care – PPO

## 2023-12-22 ENCOUNTER — Other Ambulatory Visit: Payer: Self-pay

## 2023-12-22 ENCOUNTER — Other Ambulatory Visit (HOSPITAL_COMMUNITY)
Admission: RE | Admit: 2023-12-22 | Discharge: 2023-12-22 | Disposition: A | Discharge status: Home / Self Care | Payer: BC Managed Care – PPO | Source: Ambulatory Visit | Attending: Internal Medicine | Admitting: Internal Medicine

## 2023-12-22 DIAGNOSIS — Z113 Encounter for screening for infections with a predominantly sexual mode of transmission: Secondary | ICD-10-CM

## 2023-12-22 DIAGNOSIS — B20 Human immunodeficiency virus [HIV] disease: Secondary | ICD-10-CM | POA: Diagnosis not present

## 2023-12-23 LAB — URINE CYTOLOGY ANCILLARY ONLY
Chlamydia: NEGATIVE
Comment: NEGATIVE
Comment: NORMAL
Neisseria Gonorrhea: NEGATIVE

## 2023-12-24 LAB — COMPLETE METABOLIC PANEL WITH GFR
AG Ratio: 1.6 (calc) (ref 1.0–2.5)
ALT: 16 U/L (ref 9–46)
AST: 29 U/L (ref 10–35)
Albumin: 4.4 g/dL (ref 3.6–5.1)
Alkaline phosphatase (APISO): 63 U/L (ref 35–144)
BUN: 21 mg/dL (ref 7–25)
CO2: 31 mmol/L (ref 20–32)
Calcium: 9.4 mg/dL (ref 8.6–10.3)
Chloride: 100 mmol/L (ref 98–110)
Creat: 1.04 mg/dL (ref 0.70–1.35)
Globulin: 2.8 g/dL (ref 1.9–3.7)
Glucose, Bld: 79 mg/dL (ref 65–99)
Potassium: 4.1 mmol/L (ref 3.5–5.3)
Sodium: 139 mmol/L (ref 135–146)
Total Bilirubin: 0.6 mg/dL (ref 0.2–1.2)
Total Protein: 7.2 g/dL (ref 6.1–8.1)
eGFR: 79 mL/min/{1.73_m2} (ref >=60)

## 2023-12-24 LAB — CBC WITH DIFFERENTIAL/PLATELET
Absolute Lymphocytes: 2730 {cells}/uL (ref 850–3900)
Absolute Monocytes: 892 {cells}/uL (ref 200–950)
Basophils Absolute: 27 {cells}/uL (ref 0–200)
Basophils Relative: 0.3 %
Eosinophils Absolute: 137 {cells}/uL (ref 15–500)
Eosinophils Relative: 1.5 %
HCT: 49.5 % (ref 38.5–50.0)
Hemoglobin: 16.5 g/dL (ref 13.2–17.1)
MCH: 32 pg (ref 27.0–33.0)
MCHC: 33.3 g/dL (ref 32.0–36.0)
MCV: 96.1 fL (ref 80.0–100.0)
MPV: 9.7 fL (ref 7.5–12.5)
Monocytes Relative: 9.8 %
Neutro Abs: 5314 {cells}/uL (ref 1500–7800)
Neutrophils Relative %: 58.4 %
Platelets: 233 10*3/uL (ref 140–400)
RBC: 5.15 10*6/uL (ref 4.20–5.80)
RDW: 14.5 % (ref 11.0–15.0)
Total Lymphocyte: 30 %
WBC: 9.1 10*3/uL (ref 3.8–10.8)

## 2023-12-24 LAB — T-HELPER CELLS (CD4) COUNT (NOT AT ARMC)
Absolute CD4: 993 {cells}/uL (ref 490–1740)
CD4 T Helper %: 40 % (ref 30–61)
Total lymphocyte count: 2492 {cells}/uL (ref 850–3900)

## 2023-12-24 LAB — RPR: RPR Ser Ql: NONREACTIVE

## 2023-12-24 LAB — HIV-1 RNA QUANT-NO REFLEX-BLD
HIV 1 RNA Quant: NOT DETECTED {copies}/mL
HIV-1 RNA Quant, Log: NOT DETECTED {Log}

## 2023-12-28 DIAGNOSIS — M5412 Radiculopathy, cervical region: Secondary | ICD-10-CM | POA: Diagnosis not present

## 2023-12-31 DIAGNOSIS — M069 Rheumatoid arthritis, unspecified: Secondary | ICD-10-CM | POA: Diagnosis not present

## 2023-12-31 DIAGNOSIS — M25431 Effusion, right wrist: Secondary | ICD-10-CM | POA: Diagnosis not present

## 2023-12-31 DIAGNOSIS — Z79899 Other long term (current) drug therapy: Secondary | ICD-10-CM | POA: Diagnosis not present

## 2023-12-31 DIAGNOSIS — M5412 Radiculopathy, cervical region: Secondary | ICD-10-CM | POA: Diagnosis not present

## 2024-01-05 ENCOUNTER — Ambulatory Visit: Payer: Self-pay | Admitting: Internal Medicine

## 2024-01-06 ENCOUNTER — Encounter: Payer: Self-pay | Admitting: Internal Medicine

## 2024-01-06 ENCOUNTER — Other Ambulatory Visit: Payer: Self-pay

## 2024-01-06 ENCOUNTER — Ambulatory Visit: Payer: BC Managed Care – PPO | Admitting: Internal Medicine

## 2024-01-06 VITALS — BP 117/74 | HR 71 | Resp 16 | Ht 71.0 in | Wt 161.0 lb

## 2024-01-06 DIAGNOSIS — Z113 Encounter for screening for infections with a predominantly sexual mode of transmission: Secondary | ICD-10-CM | POA: Insufficient documentation

## 2024-01-06 DIAGNOSIS — B20 Human immunodeficiency virus [HIV] disease: Secondary | ICD-10-CM

## 2024-01-06 DIAGNOSIS — Z5181 Encounter for therapeutic drug level monitoring: Secondary | ICD-10-CM | POA: Insufficient documentation

## 2024-01-06 DIAGNOSIS — L739 Follicular disorder, unspecified: Secondary | ICD-10-CM | POA: Insufficient documentation

## 2024-01-06 MED ORDER — BIKTARVY 50-200-25 MG PO TABS: 1.0000 | ORAL_TABLET | Freq: Every day | ORAL | 3 refills | Status: DC

## 2024-01-06 NOTE — Progress Notes (Signed)
   Subjective:    Patient ID: Craig Morales, male    DOB: 01-27-1956, 68 y.o.   MRN: [id number]  HPI Craig Morales is here for follow-up of HIV He continues on [place] and denies any missed doses.  He recently underwent evaluation by a private clinic and now taking iodine supplements, testosterone pellets which are implanted, and iron.  He feels improved in regards to energy and focus since starting all this.  He otherwise continues to exercise and has no new complaints.  No issues with getting or taking his medication   Review of Systems  Constitutional:  Negative for fatigue.       Objective:   Physical Exam Eyes:     General: No scleral icterus. Pulmonary:     Effort: Pulmonary effort is normal.  Neurological:     Mental Status: He is alert.   Social history.  No tobacco.        Assessment & Plan:

## 2024-01-06 NOTE — Assessment & Plan Note (Signed)
Creatinine and LFTs within normal limits.

## 2024-01-06 NOTE — Assessment & Plan Note (Signed)
Screened negative

## 2024-01-06 NOTE — Assessment & Plan Note (Signed)
He continues to do well on Biktarvy with no recollection of any missed doses over the year.  His labs are all reassuring with a good viral suppression.  Refills provided.  Labs reviewed with him.  Follow-up in 11 months.

## 2024-01-07 ENCOUNTER — Other Ambulatory Visit: Payer: Self-pay

## 2024-01-07 ENCOUNTER — Ambulatory Visit: Payer: BC Managed Care – PPO | Attending: Neurosurgery

## 2024-01-07 DIAGNOSIS — M5412 Radiculopathy, cervical region: Secondary | ICD-10-CM | POA: Diagnosis not present

## 2024-01-07 DIAGNOSIS — M6281 Muscle weakness (generalized): Secondary | ICD-10-CM | POA: Diagnosis not present

## 2024-01-07 DIAGNOSIS — M436 Torticollis: Secondary | ICD-10-CM | POA: Diagnosis not present

## 2024-01-07 NOTE — Therapy (Signed)
OUTPATIENT PHYSICAL THERAPY CERVICAL EVALUATION   Patient Name: Craig Morales MRN: [id number] DOB:1956/01/25, 68 y.o., male Today's Date: 01/07/2024  END OF SESSION:  PT End of Session - 01/07/24 0953     Visit Number 1    Date for PT Re-Evaluation 03/03/24    Progress Note Due on Visit 10    PT Start Time 0850    PT Stop Time 0935    PT Time Calculation (min) 45 min    Activity Tolerance Patient tolerated treatment well    Behavior During Therapy [facility] for tasks assessed/performed             Past Medical History:  Diagnosis Date   GERD (gastroesophageal reflux disease)    HIV (human immunodeficiency virus infection) (HCC)    Seasonal allergies    Past Surgical History:  Procedure Laterality Date   KNEE SURGERY     NASAL SEPTUM SURGERY     Patient Active Problem List   Diagnosis Date Noted   Folliculitis of nose 01/06/2024   Screening examination for sexually transmitted disease 01/06/2024   Medication monitoring encounter 01/06/2024   Acute conjunctivitis of left eye 12/17/2023   Left otitis media 12/17/2023   Erectile dysfunction 10/16/2023   Low testosterone level in male 07/31/2023   Lipodystrophy 07/30/2022   Sensorineural hearing loss (SNHL) of both ears 08/04/2019   Snoring 08/04/2019   Rheumatoid arthritis (HCC) 04/14/2016   Visit for preventive health examination 10/07/2013   Gout 10/07/2013   History of calcium pyrophosphate deposition disease (CPPD) 07/25/2013   Degenerative arthritis of spine 11/27/2011   EPISTAXIS, RECURRENT 11/26/2010   IRRITABLE BOWEL SYNDROME 05/23/2008   Herpes simplex virus (HSV) infection 02/23/2007   GERD 02/23/2007   Human immunodeficiency virus (HIV) disease (HCC) 02/12/2007   Herpes zoster 02/12/2007   DEVIATED NASAL SEPTUM 02/12/2007   Allergic rhinitis 02/12/2007   PSORIASIS 02/12/2007   DIARRHEA 02/12/2007   KNEE INJURY 02/12/2007    PCP: Arva Chafe, DO  REFERRING PROVIDER: Hoyt Koch,  MD  REFERRING DIAG: R cervical radiculopathy  THERAPY DIAG:  Radiculopathy, cervical region  Stiffness of neck  Muscle right arm weakness  Rationale for Evaluation and Treatment: Rehabilitation  ONSET DATE: chronic, years, recently worsening, exacerbated   SUBJECTIVE:                                                                                                                                                                                                         SUBJECTIVE STATEMENT: I have had this condition for a long time, and the  steroid injections have worked until the last one.  Also my work environment has changed, I was working exclusively at home and they changed our requirements so that now I have to come in 3 x week.  I don't have a consistent work station as I am there 3 x week, but I have to keep my shoulders in a more elevated position, which in turn causes my R arm to go numb and I hurt in the back of my hand.   Hand dominance: Right  PERTINENT HISTORY:  Per referring neurosurgeon's report, referring to PT to try to improve his R radicular Sx to avoid ACDF.  If he has surgery,trying to avoid C6-7 ACDF, as will need to first perform C4-5, c5-6 ACDF.  Pt has h/o RA and HIV, also B wrist fusions previously  PAIN:  Are you having pain? Yes: NPRS scale: 3-7 Pain location: R  Pain description: back of R hand, primarily 1st 3 digits, goes numb and hurts, the numbnessextends from R upper traps and side of R shoulder, across L elbow and into radial forearm Aggravating factors: prolonged R shoulder elevation forward, sleeping on that side Relieving factors: exercise and stretching, lyrica   PRECAUTIONS: Other: RA, HIV  RED FLAGS: None     WEIGHT BEARING RESTRICTIONS: No  FALLS:  Has patient fallen in last 6 months? No  LIVING ENVIRONMENT: Lives with: lives with an adult companion and lives with their partner Lives in: House/apartment Stairs: unknown Has following  equipment at home: na  OCCUPATION: works on [employer] in [employer]  PLOF: Independent  PATIENT GOALS: improve Sx R arm without surgery neck  NEXT MD VISIT: 6 weeks  OBJECTIVE:  Note: Objective measures were completed at Evaluation unless otherwise noted.  DIAGNOSTIC FINDINGS:  Not available , per pt and MD notes compression, loss of disc height C 4/5 and 5/6 and spondylolisthesis C 6/7  PATIENT SURVEYS:  NDI Neck Disability Index score: 12 / 50 = 24.0 %  COGNITION: Overall cognitive status: Within functional limits for tasks assessed  SENSATION: Intermittent numbness primarily post 1st 3 digits , sometimes entire hand  POSTURE: fixed forward head posture originating [place] thoracic jxn, atrophy noted B deltoids  PALPATION: Some tightness B upper traps, non tender per pt   CERVICAL ROM:   Active ROM A/PROM (deg) eval  Flexion 70  Extension 20  Right lateral flexion   Left lateral flexion   Right rotation 35  Left rotation 45   (Blank rows = not tested)  UPPER EXTREMITY ROM: B shoulder flexion -15 degrees terminal mvt. B wrists fused in neutral position   UPPER EXTREMITY MMT: R triceps, biceps 4/5 strength B shoulder flexion 4/5   CERVICAL SPECIAL TESTS:  nt  FUNCTIONAL TESTS:    TREATMENT DATE: 01/07/24: Evaluation, then instructed pt in therex, to adjunct his current program at gym.  Inst in kneeling / prone over physioball, 65 cm, while performing shoulder horizontal abd with ER, cues to keep shoulder at 90 degrees abd Also shoulder rows, with terminal ER B  Both exercises to engage middle trapezius musculature  Also instructed to use his foam roller( which he has at home ) and stretch with it parallel to his spine to provide pectoral/anterior chest wall stretch.     We discussed his work station and he does have a portable lumbar support, advised him to try in his work chair to provide support LS spine  PATIENT EDUCATION:  Education details: POC, goals Person educated: Patient Education method: Explanation, Demonstration, Tactile cues, and Verbal cues Education comprehension: verbalized understanding and returned demonstration  HOME EXERCISE PROGRAM: See above  ASSESSMENT:  CLINICAL IMPRESSION: Patient is a 68 y.o. male who was evaluated  today by physical therapy for chronic R cervical radiculopathy.  His sx are consistent with C6/7 compression with intermittent numbness and pain dorsal R digits 1 -3, as well as weakness R traps and biceps. He is trying to avoid surgery which would entail ACDF.  He is consistent about working out, goes to gym 4 to n5 x week and stretches with physioball.  Therefore initiated adaptations, addendums to his routine there, primarily for strengthening middle traps, to support his lower cervical spine. He should benefit from skilled PT to address his Sx.    OBJECTIVE IMPAIRMENTS: decreased ROM, decreased strength, hypomobility, increased fascial restrictions, impaired flexibility, impaired UE functional use, and pain.   ACTIVITY LIMITATIONS: lifting, sitting, sleeping, dressing, and reach over head  PARTICIPATION LIMITATIONS: community activity and occupation  PERSONAL FACTORS: Past/current experiences, Profession, Time since onset of injury/illness/exacerbation, and 1-2 comorbidities: RA, HIV  are also affecting patient's functional outcome.   REHAB POTENTIAL: Good  CLINICAL DECISION MAKING: Evolving/moderate complexity  EVALUATION COMPLEXITY: Moderate   GOALS: Goals reviewed with patient? Yes  SHORT TERM GOALS: Target date: 2 weeks, 01/21/24  I HEP Baseline:  Goal status: INITIAL   LONG TERM GOALS: Target date: 03/03/24 8 weeks  Improve NDI score from Neck Disability Index score: 12 / 50 = 24.0 % to 14% Baseline:  Goal status: INITIAL  2.  Improve radicular Sx R  UE, patient able to elevate R UE consistently and repetitively without radicular sx R  Baseline: consistent increasing radicular Sx with one rep of R shoulder elevation Goal status: INITIAL  3.  Strength R triceps improve to 5/5 Baseline: 4/5 Goal status: INITIAL    PLAN:  PT FREQUENCY: 1x/week  PT DURATION: 8 weeks  PLANNED INTERVENTIONS: 97110-Therapeutic exercises, 97530- Therapeutic activity, [id number]- Neuromuscular re-education, 97535- Self Care, 16109- Manual therapy, Patient/Family education, Taping, and Dry Needling  PLAN FOR NEXT SESSION: How were exercises?, TPDN, manual techniques, advance therex   Lucillia Corson L Kashira Behunin, PT, DPT, OCS 01/07/2024, 3:43 PM

## 2024-01-08 ENCOUNTER — Ambulatory Visit: Payer: Self-pay | Admitting: Family Medicine

## 2024-01-08 MED ORDER — IRON (FERROUS SULFATE) 325 (65 FE) MG PO TABS: 29.0000 mg | ORAL_TABLET | Freq: Every day | ORAL | 1 refills | Status: DC

## 2024-01-08 NOTE — Telephone Encounter (Signed)
Copied from CRM [id number]. Topic: Clinical - Prescription Issue >> Jan 08, 2024  3:15 PM Sim Boast F wrote: Reason for CRM: Pharmacy called wanting clarification on dosage of patient iron tablets that was sent today

## 2024-01-11 ENCOUNTER — Other Ambulatory Visit: Payer: Self-pay

## 2024-01-12 ENCOUNTER — Other Ambulatory Visit: Payer: Self-pay | Admitting: Family

## 2024-01-12 ENCOUNTER — Ambulatory Visit: Payer: Self-pay | Admitting: Family Medicine

## 2024-01-12 ENCOUNTER — Encounter: Payer: Self-pay | Admitting: Family

## 2024-01-12 ENCOUNTER — Other Ambulatory Visit: Payer: Self-pay

## 2024-01-12 ENCOUNTER — Ambulatory Visit: Payer: BC Managed Care – PPO | Admitting: Family

## 2024-01-12 ENCOUNTER — Other Ambulatory Visit (HOSPITAL_BASED_OUTPATIENT_CLINIC_OR_DEPARTMENT_OTHER): Payer: Self-pay

## 2024-01-12 VITALS — BP 132/66 | HR 79 | Temp 98.7°F | Ht 71.0 in | Wt 158.6 lb

## 2024-01-12 DIAGNOSIS — A084 Viral intestinal infection, unspecified: Secondary | ICD-10-CM | POA: Diagnosis not present

## 2024-01-12 MED ORDER — ONDANSETRON 8 MG PO TBDP
8.0000 mg | ORAL_TABLET | Freq: Three times a day (TID) | ORAL | 0 refills | Status: AC | PRN
  Filled 2024-01-12: qty 18, 6d supply, fill #0

## 2024-01-12 MED ORDER — IRON GLYCINATE 29 MG PO CAPS: 1.0000 | ORAL_CAPSULE | Freq: Every day | ORAL | 1 refills | Status: DC

## 2024-01-12 NOTE — Progress Notes (Signed)
 Craig Morales is a 68 y.o. male with the following history as recorded in Epic[facility]:  Patient Active Problem List   Diagnosis Date Noted   Folliculitis of nose 01/06/2024   Screening examination for sexually transmitted disease 01/06/2024   Medication monitoring encounter 01/06/2024   Acute conjunctivitis of left eye 12/17/2023   Left otitis media 12/17/2023   Erectile dysfunction 10/16/2023   Low testosterone  level in male 07/31/2023   Lipodystrophy 07/30/2022   Sensorineural hearing loss (SNHL) of both ears 08/04/2019   Snoring 08/04/2019   Rheumatoid arthritis (HCC) 04/14/2016   Visit for preventive health examination 10/07/2013   Gout 10/07/2013   History of calcium pyrophosphate deposition disease (CPPD) 07/25/2013   Degenerative arthritis of spine 11/27/2011   EPISTAXIS, RECURRENT 11/26/2010   IRRITABLE BOWEL SYNDROME 05/23/2008   Herpes simplex virus (HSV) infection 02/23/2007   GERD 02/23/2007   Human immunodeficiency virus (HIV) disease (HCC) 02/12/2007   Herpes zoster 02/12/2007   DEVIATED NASAL SEPTUM 02/12/2007   Allergic rhinitis 02/12/2007   PSORIASIS 02/12/2007   DIARRHEA 02/12/2007   KNEE INJURY 02/12/2007    Current Outpatient Medications  Medication Sig Dispense Refill   Ascorbic Acid (VITAMIN C WITH ROSE HIPS) 500 MG tablet Take 500 mg by mouth daily.     bictegravir-emtricitabine-tenofovir  AF (BIKTARVY ) 50-200-25 MG TABS tablet Take 1 tablet by mouth daily. 90 tablet 3   Digestive Enzymes (DIGESTIVE ENZYME PO) Take by mouth.     fluticasone  (FLONASE ) 50 MCG/ACT nasal spray USE TWO SPRAY(S) IN EACH NOSTRIL ONCE DAILY 16 g 6   folic acid  (FOLVITE ) 1 MG tablet Take 1 mg by mouth daily.     Iron  Glycinate 29 MG CAPS Take 1 capsule by mouth daily. 30 capsule 1   KRILL OIL OMEGA-3 PO Take 1 each by mouth daily.     Melatonin 5 MG CAPS Take 1 each by mouth at bedtime.     methotrexate  2.5 MG tablet Take 20 mg by mouth once a week.     Misc Natural Products  (OSTEO BI-FLEX ADV JOINT SHIELD) TABS Take 1 each by mouth daily.     Multiple Vitamins-Minerals (CENTRUM SILVER ADULT 50+ PO) Take 1 each by mouth daily.     Multiple Vitamins-Minerals (PRESERVISION AREDS PO)      neomycin -polymyxin-hydrocortisone (CORTISPORIN) 3.5-10000-1 ophthalmic suspension Place 3 drops into both eyes 3 (three) times daily. 7.5 mL 0   ondansetron  (ZOFRAN -ODT) 8 MG disintegrating tablet Take 1 tablet (8 mg total) by mouth every 8 (eight) hours as needed for nausea or vomiting. 20 tablet 0   pantoprazole  (PROTONIX ) 40 MG tablet Take 40 mg by mouth daily.     pregabalin  (LYRICA ) 100 MG capsule Take 1 tablet by mouth daily.     pregabalin  (LYRICA ) 25 MG capsule take one capsule po q am     Probiotic Product (PROBIOTIC-10 PO) Take by mouth.     Propylene Glycol (SYSTANE COMPLETE OP)      sildenafil  (VIAGRA ) 100 MG tablet Take 0.5-1 tablets (50-100 mg total) by mouth daily as needed for erectile dysfunction. 30 tablet 1   valACYclovir  (VALTREX ) 500 MG tablet Take 1 tablet (500 mg total) by mouth 2 (two) times daily. Take 1 tablet (500 mg total) by mouth 2 (two) times daily as needed. 90 tablet 3   No current facility-administered medications for this visit.    Allergies: Oxycontin  [oxycodone  hcl], Hydrocodone, and Iodine  Past Medical History:  Diagnosis Date   GERD (gastroesophageal reflux disease)  HIV (human immunodeficiency virus infection) (HCC)    Seasonal allergies     Past Surgical History:  Procedure Laterality Date   KNEE SURGERY     NASAL SEPTUM SURGERY      Family History  Problem Relation Age of Onset   Bleeding Disorder Neg Hx     Social History   Tobacco Use   Smoking status: Never   Smokeless tobacco: Never  Substance Use Topics   Alcohol use: Yes    Alcohol/week: 7.0 standard drinks of alcohol    Types: 7 Standard drinks or equivalent per week    Comment: occasional    Subjective:   2 day history of diarrhea/ chills/ fever; decreased  appetite; just don't feel good. No vomiting;   Objective:  Vitals:   01/12/24 1326  BP: 132/66  Pulse: 79  Temp: 98.7 F (37.1 C)  TempSrc: Oral  SpO2: 99%  Weight: 158 lb 9.6 oz (71.9 kg)  Height: 5' 11 (1.803 m)    General: Well developed, well nourished, in no acute distress  Skin : Warm and dry.  Head: Normocephalic and atraumatic  Eyes: Sclera and conjunctiva clear; pupils round and reactive to light; extraocular movements intact  Ears: External normal; canals clear; tympanic membranes normal  Oropharynx: Pink, supple. No suspicious lesions  Neck: Supple without thyromegaly, adenopathy  Lungs: Respirations unlabored; clear to auscultation bilaterally without wheeze, rales, rhonchi  CVS exam: normal rate and regular rhythm.  Abdomen: Soft; nontender; nondistended; hyperactive bowel sounds; no masses or hepatosplenomegaly  Musculoskeletal: No deformities; no active joint inflammation  Extremities: No edema, cyanosis, clubbing  Vessels: Symmetric bilaterally  Neurologic: Alert and oriented; speech intact; face symmetrical; moves all extremities well; CNII-XII intact without focal deficit   Assessment:  1. Viral gastroenteritis     Plan:  Rapid flu in office is negative; Rx for Zofran  ODT 8 mg q 8 hours prn nausea; BRAT diet discussed; encouraged to rest and work on staying hydrated; work note given as requested; follow up worse, no better.   No follow-ups on file.  No orders of the defined types were placed in this encounter.   Requested Prescriptions   Signed Prescriptions Disp Refills   ondansetron  (ZOFRAN -ODT) 8 MG disintegrating tablet 20 tablet 0    Sig: Take 1 tablet (8 mg total) by mouth every 8 (eight) hours as needed for nausea or vomiting.

## 2024-01-12 NOTE — Patient Instructions (Signed)
 Viral Gastroenteritis, Adult  Viral gastroenteritis is also known as the stomach flu. This condition may affect your stomach, your small intestine, and your large intestine. It can cause sudden watery poop (diarrhea), fever, and vomiting. This condition is caused by certain germs (viruses). These germs can be passed from person to person very easily (are contagious). Having watery poop and vomiting can make you feel weak and cause you to not have enough water in your body (get dehydrated). This can make you tired and thirsty, make you have a dry mouth, and make it so you pee (urinate) less often. It is important to replace the fluids that you lose from having watery poop and vomiting. What are the causes? You can get sick by catching germs from other people. You can also get sick by: Eating food, drinking water, or touching a surface that has the germs on it (is contaminated). Sharing utensils or other personal items with a person who is sick. What increases the risk? Having a weak body defense system (immune system). Living with one or more children who are younger than 2 years. Living in a nursing home. Going on cruise ships. What are the signs or symptoms? Symptoms of this condition start suddenly. Symptoms may last for a few days or for as long as a week. Common symptoms include: Watery poop. Vomiting. Other symptoms include: Fever. Headache. Feeling tired (fatigue). Pain in the belly (abdomen). Chills. Feeling weak. Feeling like you may vomit (nauseous). Muscle aches. Not feeling hungry. How is this treated? This condition typically goes away on its own. The focus of treatment is to replace the fluids that you lose. This condition may be treated with: An ORS (oral rehydration solution). This is a drink that helps you replace fluids and minerals your body lost. It is sold at pharmacies and stores. Medicines to help with your symptoms. Probiotic supplements to reduce symptoms of  watery poop. Fluids given through an IV tube, if needed. Older adults and people with other diseases or a weak body defense system are at higher risk for not having enough water in the body. Follow these instructions at home: Eating and drinking  Take an ORS as told by your doctor. Drink clear fluids in small amounts as you are able. Clear fluids include: Water. Ice chips. Fruit juice that has water added to it (is diluted). Low-calorie sports drinks. Drink enough fluid to keep your pee (urine) pale yellow. Eat small amounts of healthy foods every 3-4 hours as you are able. This may include whole grains, fruits, vegetables, lean meats, and yogurt. Avoid fluids that have a lot of sugar or caffeine in them. This includes energy drinks, sports drinks, and soda. Avoid spicy or fatty foods. Avoid alcohol. General instructions  Wash your hands often. This is very important after you have watery poop or you vomit. If you cannot use soap and water, use hand sanitizer. Make sure that all people in your home wash their hands well and often. Take over-the-counter and prescription medicines only as told by your doctor. Rest at home while you get better. Watch your condition for any changes. Take a warm bath to help with any burning or pain from having watery poop. Keep all follow-up visits. Contact a doctor if: You cannot keep fluids down. Your symptoms get worse. You have new symptoms. You feel light-headed or dizzy. You have muscle cramps. Get help right away if: You have chest pain. You have trouble breathing, or you are breathing very fast.  You have a fast heartbeat. You feel very weak or you faint. You have a very bad headache, a stiff neck, or both. You have a rash. You have very bad pain, cramping, or bloating in your belly. Your skin feels cold and clammy. You feel mixed up (confused). You have pain when you pee. You have signs of not having enough water in the body, such  as: Dark pee, hardly any pee, or no pee. Cracked lips. Dry mouth. Sunken eyes. Feeling very sleepy. Feeling weak. You have signs of bleeding, such as: You see blood in your vomit. Your vomit looks like coffee grounds. You have bloody or black poop or poop that looks like tar. These symptoms may be an emergency. Get help right away. Call 911. Do not wait to see if the symptoms will go away. Do not drive yourself to the hospital. Summary Viral gastroenteritis is also known as the stomach flu. This condition can cause sudden watery poop (diarrhea), fever, and vomiting. These germs can be passed from person to person very easily. Take an ORS (oral rehydration solution) as told by your doctor. This is a drink that is sold at pharmacies and stores. Wash your hands often, especially after having watery poop or vomiting. If you cannot use soap and water, use hand sanitizer. This information is not intended to replace advice given to you by your health care provider. Make sure you discuss any questions you have with your health care provider. Document Revised: 09/23/2021 Document Reviewed: 09/23/2021 Elsevier Patient Education  2024 [employer].

## 2024-01-12 NOTE — Telephone Encounter (Signed)
 Chief Complaint: diarrhea  Symptoms: nausea, cramping, low grade fever Frequency: Sunday Pertinent Negatives: Patient denies bloody stool, vomiting, severe abd pain Disposition: [] ED /[] Urgent Care (no appt availability in office) / [x] Appointment(In office/virtual)/ []  [facility]/ [] Home Care/ [] Refused Recommended Disposition /[] [facility]/ []  Follow-up with PCP Additional Notes: Patient c/o diarrhea since Sunday. Reports that he also has low grade fevers. Denies blood in stool, vomiting, severe abd pain. Endorses taking immodium and Tums with some relief. Able to drink and keep fluids down. Scheduled patient per protocol on 01/12/2024. Patient verbalized understanding and to call back with worsening symptoms.   Copied From CRM [id number]. Reason for Triage: Diarrhea / fever off and on (98.1 currently) / chills / nauseous   Reason for Disposition  [1] MODERATE diarrhea (e.g., 4-6 times / day more than normal) AND [2] present > 48 hours (2 days)  Answer Assessment - Initial Assessment Questions 1. DIARRHEA SEVERITY: How bad is the diarrhea? How many more stools have you had in the past 24 hours than normal?    - NO DIARRHEA (SCALE 0)   - MILD (SCALE 1-3): Few loose or mushy BMs; increase of 1-3 stools over normal daily number of stools; mild increase in ostomy output.   -  MODERATE (SCALE 4-7): Increase of 4-6 stools daily over normal; moderate increase in ostomy output.   -  SEVERE (SCALE 8-10; OR WORST POSSIBLE): Increase of 7 or more stools daily over normal; moderate increase in ostomy output; incontinence.     Loose and watery, going 1x q 2 hours pre-anti-diarrhea meds Post-anti diarrhea meds x 2 6-8x over past 24 hours - moderate 2. ONSET: When did the diarrhea begin?      Sunday 3. BM CONSISTENCY: How loose or watery is the diarrhea?      Watery and loose 4. VOMITING: Are you also vomiting? If Yes, ask: How many times in the past 24 hours?       Nausea, no vomiting - took Tums Felt burning up in my throat 5. ABDOMEN PAIN: Are you having any abdomen pain? If Yes, ask: What does it feel like? (e.g., crampy, dull, intermittent, constant)      Just cramps 6. ABDOMEN PAIN SEVERITY: If present, ask: How bad is the pain?  (e.g., Scale 1-10; mild, moderate, or severe)   - MILD (1-3): doesn't interfere with normal activities, abdomen soft and not tender to touch    - MODERATE (4-7): interferes with normal activities or awakens from sleep, abdomen tender to touch    - SEVERE (8-10): excruciating pain, doubled over, unable to do any normal activities       Cramps when I'm about to have a BM - mild-moderate 7. ORAL INTAKE: If vomiting, Have you been able to drink liquids? How much liquids have you had in the past 24 hours?     Yes, drinking gatorade 8. HYDRATION: Any signs of dehydration? (e.g., dry mouth [not just dry lips], too weak to stand, dizziness, new weight loss) When did you last urinate?     During the night, I feel light-headed getting up. Not sure if its because I was half a sleep or dehydrated Mouth feels dry 9. EXPOSURE: Have you traveled to a foreign country recently? Have you been exposed to anyone with diarrhea? Could you have eaten any food that was spoiled?     no 10. ANTIBIOTIC USE: Are you taking antibiotics now or have you taken antibiotics in the past 2  months?       no 11. OTHER SYMPTOMS: Do you have any other symptoms? (e.g., fever, blood in stool)       Intermittent low grade fever Sunday and yesterday - tmax 99.4 Denies blood Feels very weak  Protocols used: Diarrhea-A-AH

## 2024-01-12 NOTE — Telephone Encounter (Signed)
 Correct sig was sent to pharmacy.

## 2024-01-14 ENCOUNTER — Other Ambulatory Visit: Payer: Self-pay

## 2024-01-14 DIAGNOSIS — B20 Human immunodeficiency virus [HIV] disease: Secondary | ICD-10-CM

## 2024-01-14 MED ORDER — BIKTARVY 50-200-25 MG PO TABS: 1.0000 | ORAL_TABLET | Freq: Every day | ORAL | 3 refills | Status: DC

## 2024-01-15 ENCOUNTER — Ambulatory Visit: Payer: BC Managed Care – PPO | Admitting: Physical Therapy

## 2024-01-21 NOTE — Therapy (Signed)
OUTPATIENT PHYSICAL THERAPY TREATMENT   Patient Name: Craig Morales MRN: [id number] DOB:1956/09/01, 68 y.o., male Today's Date: 01/22/2024  END OF SESSION:  PT End of Session - 01/22/24 0804     Visit Number 2    Date for PT Re-Evaluation 03/03/24    Authorization Type [employer]    Progress Note Due on Visit 10    PT Start Time 0804    PT Stop Time 0848    PT Time Calculation (min) 44 min    Activity Tolerance Patient tolerated treatment well    Behavior During Therapy [facility] for tasks assessed/performed              Past Medical History:  Diagnosis Date   GERD (gastroesophageal reflux disease)    HIV (human immunodeficiency virus infection) (HCC)    Seasonal allergies    Past Surgical History:  Procedure Laterality Date   KNEE SURGERY     NASAL SEPTUM SURGERY     Patient Active Problem List   Diagnosis Date Noted   Folliculitis of nose 01/06/2024   Screening examination for sexually transmitted disease 01/06/2024   Medication monitoring encounter 01/06/2024   Acute conjunctivitis of left eye 12/17/2023   Left otitis media 12/17/2023   Erectile dysfunction 10/16/2023   Low testosterone level in male 07/31/2023   Lipodystrophy 07/30/2022   Sensorineural hearing loss (SNHL) of both ears 08/04/2019   Snoring 08/04/2019   Rheumatoid arthritis (HCC) 04/14/2016   Visit for preventive health examination 10/07/2013   Gout 10/07/2013   History of calcium pyrophosphate deposition disease (CPPD) 07/25/2013   Degenerative arthritis of spine 11/27/2011   EPISTAXIS, RECURRENT 11/26/2010   IRRITABLE BOWEL SYNDROME 05/23/2008   Herpes simplex virus (HSV) infection 02/23/2007   GERD 02/23/2007   Human immunodeficiency virus (HIV) disease (HCC) 02/12/2007   Herpes zoster 02/12/2007   DEVIATED NASAL SEPTUM 02/12/2007   Allergic rhinitis 02/12/2007   PSORIASIS 02/12/2007   DIARRHEA 02/12/2007   KNEE INJURY 02/12/2007    PCP: Arva Chafe, DO  REFERRING PROVIDER:  Hoyt Koch, MD  REFERRING DIAG: R cervical radiculopathy  THERAPY DIAG:  Radiculopathy, cervical region  Stiffness of neck  Muscle right arm weakness  RATIONALE FOR EVALUATION AND TREATMENT: Rehabilitation  ONSET DATE: chronic, years, recently worsening, exacerbated   NEXT MD VISIT: 6 weeks   SUBJECTIVE:  SUBJECTIVE STATEMENT: Pt reports he was not able to do any of his exercises last week due to ill with GI bug. No problems as he has resumed the HEP this week.  EVAL: I have had this condition for a long time, and the steroid injections have worked until the last one.  Also my work environment has changed, I was working exclusively at home and they changed our requirements so that now I have to come in 3 x week.  I don't have a consistent work station as I am there 3 x week, but I have to keep my shoulders in a more elevated position, which in turn causes my R arm to go numb and I hurt in the back of my hand.    Hand dominance: Right  PERTINENT HISTORY:  Per referring neurosurgeon's report, referring to PT to try to improve his R radicular Sx to avoid ACDF.  If he has surgery, trying to avoid C6-7 ACDF, as will need to first perform C4-5, C5-6 ACDF.  Pt has h/o RA and HIV, also B wrist fusions previously  PAIN:  Are you having pain? Yes: NPRS scale: 0/10 Pain location: R  Pain description: back of R hand, primarily 1st 3 digits, goes numb and hurts, the numbnessextends from R upper traps and side of R shoulder, across L elbow and into radial forearm Aggravating factors: prolonged R shoulder elevation forward, sleeping on that side Relieving factors: exercise and stretching, lyrica   PRECAUTIONS: Other: RA, HIV  RED FLAGS: None    WEIGHT BEARING RESTRICTIONS: No  FALLS:   Has patient fallen in last 6 months? No  LIVING ENVIRONMENT: Lives with: lives with an adult companion and lives with their partner Lives in: House/apartment Stairs: unknown Has following equipment at home: na  OCCUPATION: works on [employer]  PLOF: Independent  PATIENT GOALS: improve Sx R arm without surgery neck   OBJECTIVE:  Note: Objective measures were completed at Evaluation unless otherwise noted.  DIAGNOSTIC FINDINGS:  Not available , per pt and MD notes compression, loss of disc height C 4/5 and 5/6 and spondylolisthesis C 6/7  PATIENT SURVEYS:  NDI Neck Disability Index score: 12 / 50 = 24.0 %  COGNITION: Overall cognitive status: Within functional limits for tasks assessed  SENSATION: Intermittent numbness primarily post 1st 3 digits , sometimes entire hand  POSTURE: fixed forward head posture originating Cervico thoracic jxn, atrophy noted B deltoids  PALPATION: Some tightness B upper traps, non tender per pt   CERVICAL ROM:   Active ROM Eval  Flexion 70  Extension 20  Right lateral flexion   Left lateral flexion   Right rotation 35  Left rotation 45   (Blank rows = not tested)  UPPER EXTREMITY ROM: B shoulder flexion -15 degrees terminal mvt. B wrists fused in neutral position  UPPER EXTREMITY MMT: R triceps, biceps 4/5 strength B shoulder flexion 4/5  CERVICAL SPECIAL TESTS:  nt  FUNCTIONAL TESTS:    TREATMENT DATE:   01/22/24 THERAPEUTIC EXERCISE: To improve strength, endurance, and flexibility.  Demonstration, verbal and tactile cues throughout for technique.  NuStep - L 4 x 6 min (UE only - LE deferred d/t recent procedure on his L buttock)  Seated R gentle UT & LS stretches 3 x 30" each Hooklying pectoral/anterior chest wall stretch over foam roller (patient reports he has not yet tried this at home as discussed on eval visit) - attempted horizontal ABD, open book position, and snow angels with  patient noting best  stretch in horizontal ABD position  Hooklying over foam roller - RTB scap retraction + horizontal abduction 10 x 5"  NEUROMUSCULAR RE-EDUCATION: To improve coordination, kinesthetic sense, and reduce nerve impingement .  Median nerve glides and flossing (tray)   MANUAL THERAPY: To promote normalized muscle tension, improved flexibility, improved joint mobility, increased ROM, and reduced pain. Trigger Point Dry Needling: Treatment instructions/education: Initial Treatment: Pt instructed on Dry Needling rational, procedures, and possible side effects. Pt instructed to expect mild to moderate muscle soreness later in the day and/or into the next day.  Pt instructed in methods to reduce muscle soreness. Pt instructed to continue prescribed HEP. Because Dry Needling was performed over or adjacent to a lung field, pt was educated on S/S of pneumothorax and to seek immediate medical attention should they occur.  Patient was educated on signs and symptoms of infection and other risk factors and advised to seek medical attention should they occur.  Patient verbalized understanding of these instructions and education.  Education Handout Provided: Yes Consent: Patient Verbal Consent Given: Yes Treatment: Muscles Treated: R UT and LS Skilled palpation and monitoring of soft tissue during DN Electrical Stimulation Performed: No Treatment Response/Outcome: Twitch Response Elicited, Palpable Increase in Muscle Length, Decreased TTP, and Improved Exercise Tolerance STM/DTM, manual TPR and pin & stretch to muscles addressed with DN   01/07/24: Evaluation, then instructed pt in therex, to adjunct his current program at gym.  Inst in kneeling / prone over physioball, 65 cm, while performing shoulder horizontal abd with ER, cues to keep shoulder at 90 degrees abd Also shoulder rows, with terminal ER B  Both exercises to engage middle trapezius musculature  Also instructed to use his foam roller ( which  he has at home ) and stretch with it parallel to his spine to provide pectoral/anterior chest wall stretch.     We discussed his work station and he does have a portable lumbar support, advised him to try in his work chair to provide support LS spine                                                                                                                             PATIENT EDUCATION:  Education details: HEP review, HEP update, role of DN, and DN rational, procedure, outcomes, potential side effects, and recommended post-treatment exercises/activity  Person educated: Patient Education method: Explanation, Demonstration, Tactile cues, Verbal cues, and Handouts Education comprehension: verbalized understanding, returned demonstration, verbal cues required, and needs further education  HOME EXERCISE PROGRAM: Access Code: [email or web address] URL: [email or web address] Date: 01/22/2024 Prepared by: Glenetta Hew  Exercises - Prone Middle Trapezius Strengthening on Swiss Ball  - 1 x daily - 7 x weekly - 2 sets - 10 reps - 3 sec hold - Prone on Swiss Ball Shoulder Row to ER to Overhead Reach w/o weight  - 1 x daily - 7 x weekly - 2 sets - 10 reps - 3  sec hold - Seated Upper Trapezius Stretch  - 1-2 x daily - 7 x weekly - 3 reps - 30 sec hold - Gentle Levator Scapulae Stretch  - 1-2 x daily - 7 x weekly - 3 reps - 30 sec hold - Supine Chest Stretch on Foam Roll  - 1 x daily - 7 x weekly - 2 sets - 10 reps - 3 sec hold - Supine Shoulder Horizontal Abduction with Resistance  - 1 x daily - 7 x weekly - 2 sets - 10 reps - 3 sec hold - Standing Median Nerve Glide  - 1 x daily - 7 x weekly - 2 sets - 10 reps - 3 sec hold  Patient Education - Trigger Point Dry Needling   ASSESSMENT:  CLINICAL IMPRESSION: Cecilia expressed interest in proceeding with trial of TPDN discussed on eval visit, noting biggest issue with feeling of knots in R upper shoulder.  After explanation of DN rational,  procedures, outcomes and potential side effects, including precautions with DN over the lung fields, patient verbalized consent to DN treatment in conjunction with manual STM/DTM and TPR to reduce ttp/muscle tension. Muscles treated as indicated above. DN produced normal response with good twitches elicited resulting in palpable reduction in pain/ttp and muscle tension. Pt educated to expect mild to moderate muscle soreness for up to 24-48 hrs and instructed to continue prescribed HEP and current activity level with pt verbalizing understanding of these instructions.  Dezmin reports he has been performing the prone over physioball exercises and notes improving awareness of muscle activation in his middle traps, however he has not attempted the stretches with the foam roller at home yet.  Reviewed these as provided instruction in gentle stretching for UT and LS with good return demonstration.  He notes intermittent numbness and tingling predominantly in median nerve distribution, more common in the morning upon waking and present today, therefore introduced median nerve glide and flossing - deferred "tray" flossing from HEP as this seemed to spread the numbness and tingling throughout his hand.  Also progressed scapular strengthening with introduction of RTB horizontal abduction over foam roller.  Patient reporting resolution of R hand numbness and tingling by end of session.  Atzin will benefit from skilled PT to address above deficits to improve mobility and activity tolerance with decreased pain interference.   OBJECTIVE IMPAIRMENTS: decreased ROM, decreased strength, hypomobility, increased fascial restrictions, impaired flexibility, impaired UE functional use, and pain.   ACTIVITY LIMITATIONS: lifting, sitting, sleeping, dressing, and reach over head  PARTICIPATION LIMITATIONS: community activity and occupation  PERSONAL FACTORS: Past/current experiences, Profession, Time since onset of  injury/illness/exacerbation, and 1-2 comorbidities: RA, HIV  are also affecting patient's functional outcome.   REHAB POTENTIAL: Good  CLINICAL DECISION MAKING: Evolving/moderate complexity  EVALUATION COMPLEXITY: Moderate   GOALS: Goals reviewed with patient? Yes  SHORT TERM GOALS: Target date: 2 weeks, 01/21/24  I HEP Baseline:  Goal status: PARTIALLY MET - 01/22/24  LONG TERM GOALS: Target date: 03/03/24 8 weeks  Improve NDI score from Neck Disability Index score: 12 / 50 = 24.0 % to 14% Baseline:  Goal status: IN PROGRESS  2.  Improve radicular Sx R UE, patient able to elevate R UE consistently and repetitively without radicular sx R  Baseline: consistent increasing radicular Sx with one rep of R shoulder elevation Goal status: IN PROGRESS  3.  Strength R triceps improve to 5/5 Baseline: 4/5 Goal status: IN PROGRESS    PLAN:  PT FREQUENCY: 1x/week  PT  DURATION: 8 weeks  PLANNED INTERVENTIONS: 97110-Therapeutic exercises, 97530- Therapeutic activity, [id number]- Neuromuscular re-education, 97535- Self Care, [id number]- Manual therapy, Patient/Family education, Taping, and Dry Needling  PLAN FOR NEXT SESSION: Assess response to TPDN; manual techniques, advance therex   Marry Guan, PT 01/22/2024, 1:35 PM

## 2024-01-22 ENCOUNTER — Encounter: Payer: Self-pay | Admitting: Physical Therapy

## 2024-01-22 ENCOUNTER — Ambulatory Visit: Payer: BC Managed Care – PPO | Attending: Neurosurgery | Admitting: Physical Therapy

## 2024-01-22 DIAGNOSIS — M5412 Radiculopathy, cervical region: Secondary | ICD-10-CM | POA: Diagnosis not present

## 2024-01-22 DIAGNOSIS — M436 Torticollis: Secondary | ICD-10-CM | POA: Diagnosis not present

## 2024-01-22 DIAGNOSIS — M6281 Muscle weakness (generalized): Secondary | ICD-10-CM | POA: Diagnosis not present

## 2024-01-28 ENCOUNTER — Ambulatory Visit: Payer: BC Managed Care – PPO

## 2024-02-05 ENCOUNTER — Ambulatory Visit: Payer: BC Managed Care – PPO | Admitting: Physical Therapy

## 2024-02-05 ENCOUNTER — Encounter: Payer: Self-pay | Admitting: Physical Therapy

## 2024-02-05 DIAGNOSIS — M436 Torticollis: Secondary | ICD-10-CM

## 2024-02-05 DIAGNOSIS — M6281 Muscle weakness (generalized): Secondary | ICD-10-CM | POA: Diagnosis not present

## 2024-02-05 DIAGNOSIS — M5412 Radiculopathy, cervical region: Secondary | ICD-10-CM

## 2024-02-05 NOTE — Therapy (Signed)
 OUTPATIENT PHYSICAL THERAPY TREATMENT   Patient Name: Craig Morales MRN: [id number] DOB:May 08, 1956, 68 y.o., male Today's Date: 02/05/2024  END OF SESSION:  PT End of Session - 02/05/24 0801     Visit Number 3    Date for PT Re-Evaluation 03/03/24    Authorization Type [employer]    Progress Note Due on Visit 10    PT Start Time 0801    PT Stop Time 0849    PT Time Calculation (min) 48 min    Activity Tolerance Patient tolerated treatment well    Behavior During Therapy [facility] for tasks assessed/performed               Past Medical History:  Diagnosis Date   GERD (gastroesophageal reflux disease)    HIV (human immunodeficiency virus infection) (HCC)    Seasonal allergies    Past Surgical History:  Procedure Laterality Date   KNEE SURGERY     NASAL SEPTUM SURGERY     Patient Active Problem List   Diagnosis Date Noted   Folliculitis of nose 01/06/2024   Screening examination for sexually transmitted disease 01/06/2024   Medication monitoring encounter 01/06/2024   Acute conjunctivitis of left eye 12/17/2023   Left otitis media 12/17/2023   Erectile dysfunction 10/16/2023   Low testosterone level in male 07/31/2023   Lipodystrophy 07/30/2022   Sensorineural hearing loss (SNHL) of both ears 08/04/2019   Snoring 08/04/2019   Rheumatoid arthritis (HCC) 04/14/2016   Visit for preventive health examination 10/07/2013   Gout 10/07/2013   History of calcium pyrophosphate deposition disease (CPPD) 07/25/2013   Degenerative arthritis of spine 11/27/2011   EPISTAXIS, RECURRENT 11/26/2010   IRRITABLE BOWEL SYNDROME 05/23/2008   Herpes simplex virus (HSV) infection 02/23/2007   GERD 02/23/2007   Human immunodeficiency virus (HIV) disease (HCC) 02/12/2007   Herpes zoster 02/12/2007   DEVIATED NASAL SEPTUM 02/12/2007   Allergic rhinitis 02/12/2007   PSORIASIS 02/12/2007   DIARRHEA 02/12/2007   KNEE INJURY 02/12/2007    PCP: Arva Chafe, DO  REFERRING PROVIDER:  Hoyt Koch, MD  REFERRING DIAG: R cervical radiculopathy  THERAPY DIAG:  Radiculopathy, cervical region  Stiffness of neck  Muscle right arm weakness  RATIONALE FOR EVALUATION AND TREATMENT: Rehabilitation  ONSET DATE: chronic, years, recently worsening, exacerbated   NEXT MD VISIT: was supposed to be 02/26/24, but needs to call to reschedule   SUBJECTIVE:  SUBJECTIVE STATEMENT: Pt reports he completed the HEP 2x last week and 1x this week.  He feels like the DN last visit "activated" the muscles (in a good way).  Currently experiencing the R UE numbness and tingling today after working the last 3 days in the office.  EVAL: I have had this condition for a long time, and the steroid injections have worked until the last one.  Also my work environment has changed, I was working exclusively at home and they changed our requirements so that now I have to come in 3 x week.  I don't have a consistent work station as I am there 3 x week, but I have to keep my shoulders in a more elevated position, which in turn causes my R arm to go numb and I hurt in the back of my hand.    Hand dominance: Right  PERTINENT HISTORY:  Per referring neurosurgeon's report, referring to PT to try to improve his R radicular Sx to avoid ACDF.  If he has surgery, trying to avoid C6-7 ACDF, as will need to first perform C4-5, C5-6 ACDF.  Pt has h/o RA and HIV, also B wrist fusions previously  PAIN:  Are you having pain? Yes: NPRS scale: 0/10 Pain location: R  Pain description: numbness and tingling back of R hand, primarily 1st 2 digits, the numbness extends from R upper traps and side of R shoulder, across L elbow and into radial forearm Aggravating factors: prolonged R shoulder elevation forward, sleeping on that  side Relieving factors: exercise and stretching, lyrica   PRECAUTIONS: Other: RA, HIV  RED FLAGS: None    WEIGHT BEARING RESTRICTIONS: No  FALLS:  Has patient fallen in last 6 months? No  LIVING ENVIRONMENT: Lives with: lives with an adult companion and lives with their partner Lives in: House/apartment Stairs: unknown Has following equipment at home: na  OCCUPATION: works on [employer]  PLOF: Independent  PATIENT GOALS: improve Sx R arm without surgery neck   OBJECTIVE:  Note: Objective measures were completed at Evaluation unless otherwise noted.  DIAGNOSTIC FINDINGS:  Not available , per pt and MD notes compression, loss of disc height C 4/5 and 5/6 and spondylolisthesis C 6/7  PATIENT SURVEYS:  NDI Neck Disability Index score: 12 / 50 = 24.0 %  COGNITION: Overall cognitive status: Within functional limits for tasks assessed  SENSATION: Intermittent numbness primarily post 1st 3 digits , sometimes entire hand  POSTURE: fixed forward head posture originating Cervico thoracic jxn, atrophy noted B deltoids  PALPATION: Some tightness B upper traps, non tender per pt   CERVICAL ROM:   Active ROM Eval  Flexion 70  Extension 20  Right lateral flexion   Left lateral flexion   Right rotation 35  Left rotation 45   (Blank rows = not tested)  UPPER EXTREMITY ROM: B shoulder flexion -15 degrees terminal mvt. B wrists fused in neutral position  UPPER EXTREMITY MMT: R triceps, biceps 4/5 strength B shoulder flexion 4/5  CERVICAL SPECIAL TESTS:  nt  FUNCTIONAL TESTS:    TREATMENT DATE:   02/05/24 THERAPEUTIC EXERCISE: To improve strength and endurance.  Demonstration, verbal and tactile cues throughout for technique.  UBE - L2.5 x 6 min (3' each fwd & back)  NEUROMUSCULAR RE-EDUCATION: To improve coordination, kinesthesia, posture, and reduce nerve impingement .  R UE medial nerve glide Brachial plexus nerve glide Standing scap  retraction into doorframe + RTB B shoulder horiz ABD 2 x 10  Standing scap retraction into doorframe + RTB B shoulder ER 2 x 10  MANUAL THERAPY: To promote normalized muscle tension, improved flexibility, reduced pain, and reduce radicular symptoms  . Trigger Point Dry Needling: Treatment instructions/education: Subsequent Treatment: Instructions provided previously at initial dry needling treatment.  Education Handout Provided: Previously Provided Consent: Patient Verbal Consent Given: Yes Treatment: Muscles Treated: R UT, LS, scalenes, cervical paraspinals and B cervical multifidi Skilled palpation and monitoring of soft tissue during DN Electrical Stimulation Performed: No Treatment Response/Outcome: Twitch Response Elicited, Palpable Increase in Muscle Length, Decreased TTP, Improved Exercise Tolerance, and resolution of R UE numbness and tingling  STM/DTM, manual TPR and pin & stretch to muscles addressed with DN    01/22/24 THERAPEUTIC EXERCISE: To improve strength, endurance, and flexibility.  Demonstration, verbal and tactile cues throughout for technique.  NuStep - L 4 x 6 min (UE only - LE deferred d/t recent procedure on his L buttock)  Seated R gentle UT & LS stretches 3 x 30" each Hooklying pectoral/anterior chest wall stretch over foam roller (patient reports he has not yet tried this at home as discussed on eval visit) - attempted horizontal ABD, open book position, and snow angels with patient noting best stretch in horizontal ABD position  Hooklying over foam roller - RTB scap retraction + horizontal abduction 10 x 5"  NEUROMUSCULAR RE-EDUCATION: To improve coordination, kinesthetic sense, and reduce nerve impingement .  Median nerve glides and flossing (tray)   MANUAL THERAPY: To promote normalized muscle tension, improved flexibility, improved joint mobility, increased ROM, and reduced pain. Trigger Point Dry Needling: Treatment instructions/education: Initial  Treatment: Pt instructed on Dry Needling rational, procedures, and possible side effects. Pt instructed to expect mild to moderate muscle soreness later in the day and/or into the next day.  Pt instructed in methods to reduce muscle soreness. Pt instructed to continue prescribed HEP. Because Dry Needling was performed over or adjacent to a lung field, pt was educated on S/S of pneumothorax and to seek immediate medical attention should they occur.  Patient was educated on signs and symptoms of infection and other risk factors and advised to seek medical attention should they occur.  Patient verbalized understanding of these instructions and education.  Education Handout Provided: Yes Consent: Patient Verbal Consent Given: Yes Treatment: Muscles Treated: R UT and LS Skilled palpation and monitoring of soft tissue during DN Electrical Stimulation Performed: No Treatment Response/Outcome: Twitch Response Elicited, Palpable Increase in Muscle Length, Decreased TTP, and Improved Exercise Tolerance STM/DTM, manual TPR and pin & stretch to muscles addressed with DN   01/07/24: Evaluation, then instructed pt in therex, to adjunct his current program at gym.  Inst in kneeling / prone over physioball, 65 cm, while performing shoulder horizontal abd with ER, cues to keep shoulder at 90 degrees abd Also shoulder rows, with terminal ER B  Both exercises to engage middle trapezius musculature  Also instructed to use his foam roller ( which he has at home ) and stretch with it parallel to his spine to provide pectoral/anterior chest wall stretch.     We discussed his work station and he does have a portable lumbar support, advised him to try in his work chair to provide support LS spine  PATIENT EDUCATION:  Education details: HEP review, HEP update, role of DN, and DN rational,  procedure, outcomes, potential side effects, and recommended post-treatment exercises/activity  Person educated: Patient Education method: Explanation, Demonstration, Tactile cues, Verbal cues, and Handouts Education comprehension: verbalized understanding, returned demonstration, verbal cues required, and needs further education  HOME EXERCISE PROGRAM: Access Code: [email or web address] URL: [email or web address] Date: 02/05/2024 Prepared by: Glenetta Hew  Exercises - Prone Middle Trapezius Strengthening on Swiss Ball  - 1 x daily - 7 x weekly - 2 sets - 10 reps - 3 sec hold - Prone on Swiss Ball Shoulder Row to ER to Overhead Reach w/o weight  - 1 x daily - 7 x weekly - 2 sets - 10 reps - 3 sec hold - Seated Upper Trapezius Stretch  - 1-2 x daily - 7 x weekly - 3 reps - 30 sec hold - Gentle Levator Scapulae Stretch  - 1-2 x daily - 7 x weekly - 3 reps - 30 sec hold - Supine Chest Stretch on Foam Roll  - 1 x daily - 7 x weekly - 2 sets - 10 reps - 3 sec hold - Supine Shoulder Horizontal Abduction with Resistance  - 1 x daily - 7 x weekly - 2 sets - 10 reps - 3 sec hold - Standing Median Nerve Glide  - 1 x daily - 7 x weekly - 2 sets - 10 reps - 3 sec hold - Shoulder External Rotation and Scapular Retraction with Resistance  - 1 x daily - 3 x weekly - 2 sets - 10 reps - 3-5 sec hold - Standing Shoulder Horizontal Abduction with Resistance  - 1 x daily - 3 x weekly - 2 sets - 10 reps - 3 sec hold - Standing Shoulder Diagonal Horizontal Abduction 60/120 Degrees with Resistance  - 1 x daily - 3 x weekly - 2 sets - 10 reps - 3 sec hold  Patient Education - Trigger Point Dry Needling - Brachial plexus nerve glide   ASSESSMENT:  CLINICAL IMPRESSION: Jimmey reported that his muscles felt "activated" after the initial trial of TPDN and expressed interest in proceeding with this again today.  He notes increased R hand and UE numbness and tingling today after 3 days of working in the office.   Reviewed nerve glides with no real change noted in numbness and tingling.  MT performed incorporating TPDN with good twitch responses elicited and patient reporting resolution of numbness and tingling.  Progressed scapular strengthening upright positions using doorframe for tactile cueing for upright posture and scapular engagement with good tolerance therefore instructions provided for home use as patient feels like this would be more convenient for him to perform when he is in the office.  Jamy will benefit from skilled PT to address above deficits to improve mobility and activity tolerance with decreased pain interference.   OBJECTIVE IMPAIRMENTS: decreased ROM, decreased strength, hypomobility, increased fascial restrictions, impaired flexibility, impaired UE functional use, and pain.   ACTIVITY LIMITATIONS: lifting, sitting, sleeping, dressing, and reach over head  PARTICIPATION LIMITATIONS: community activity and occupation  PERSONAL FACTORS: Past/current experiences, Profession, Time since onset of injury/illness/exacerbation, and 1-2 comorbidities: RA, HIV  are also affecting patient's functional outcome.   REHAB POTENTIAL: Good  CLINICAL DECISION MAKING: Evolving/moderate complexity  EVALUATION COMPLEXITY: Moderate   GOALS: Goals reviewed with patient? Yes  SHORT TERM GOALS: Target date: 2 weeks, 01/21/24  I HEP Baseline:  Goal status: MET - 02/05/24  LONG TERM GOALS: Target date: 03/03/24  8 weeks  Improve NDI score from Neck Disability Index score: 12 / 50 = 24.0 % to 14% Baseline:  Goal status: IN PROGRESS  2.  Improve radicular Sx R UE, patient able to elevate R UE consistently and repetitively without radicular sx R  Baseline: consistent increasing radicular Sx with one rep of R shoulder elevation Goal status: IN PROGRESS  3.  Strength R triceps improve to 5/5 Baseline: 4/5 Goal status: IN PROGRESS    PLAN:  PT FREQUENCY: 1x/week  PT DURATION: 8  weeks  PLANNED INTERVENTIONS: 97110-Therapeutic exercises, 97530- Therapeutic activity, 97112- Neuromuscular re-education, 97535- Self Care, 16109- Manual therapy, Patient/Family education, Taping, and Dry Needling  PLAN FOR NEXT SESSION: Assess response to TPDN; manual techniques, advance therex   Marry Guan, PT 02/05/2024, 8:52 AM

## 2024-02-08 ENCOUNTER — Encounter: Payer: Self-pay | Admitting: Physical Therapy

## 2024-02-08 ENCOUNTER — Ambulatory Visit: Payer: BC Managed Care – PPO | Attending: Neurosurgery | Admitting: Physical Therapy

## 2024-02-08 DIAGNOSIS — M5412 Radiculopathy, cervical region: Secondary | ICD-10-CM | POA: Diagnosis not present

## 2024-02-08 DIAGNOSIS — M6281 Muscle weakness (generalized): Secondary | ICD-10-CM | POA: Diagnosis not present

## 2024-02-08 DIAGNOSIS — M436 Torticollis: Secondary | ICD-10-CM | POA: Insufficient documentation

## 2024-02-08 NOTE — Therapy (Addendum)
 OUTPATIENT PHYSICAL THERAPY TREATMENT   Patient Name: Craig Morales MRN: [id number] DOB:01/03/1956, 68 y.o., male Today's Date: 02/08/2024  END OF SESSION:  PT End of Session - 02/08/24 0844     Visit Number 4    Date for PT Re-Evaluation 03/03/24    Authorization Type [employer]    Progress Note Due on Visit 10    PT Start Time 0844    PT Stop Time 0926    PT Time Calculation (min) 42 min    Activity Tolerance Patient tolerated treatment well    Behavior During Therapy [facility] for tasks assessed/performed                Past Medical History:  Diagnosis Date   GERD (gastroesophageal reflux disease)    HIV (human immunodeficiency virus infection) (HCC)    Seasonal allergies    Past Surgical History:  Procedure Laterality Date   KNEE SURGERY     NASAL SEPTUM SURGERY     Patient Active Problem List   Diagnosis Date Noted   Folliculitis of nose 01/06/2024   Screening examination for sexually transmitted disease 01/06/2024   Medication monitoring encounter 01/06/2024   Acute conjunctivitis of left eye 12/17/2023   Left otitis media 12/17/2023   Erectile dysfunction 10/16/2023   Low testosterone level in male 07/31/2023   Lipodystrophy 07/30/2022   Sensorineural hearing loss (SNHL) of both ears 08/04/2019   Snoring 08/04/2019   Rheumatoid arthritis (HCC) 04/14/2016   Visit for preventive health examination 10/07/2013   Gout 10/07/2013   History of calcium pyrophosphate deposition disease (CPPD) 07/25/2013   Degenerative arthritis of spine 11/27/2011   EPISTAXIS, RECURRENT 11/26/2010   IRRITABLE BOWEL SYNDROME 05/23/2008   Herpes simplex virus (HSV) infection 02/23/2007   GERD 02/23/2007   Human immunodeficiency virus (HIV) disease (HCC) 02/12/2007   Herpes zoster 02/12/2007   DEVIATED NASAL SEPTUM 02/12/2007   Allergic rhinitis 02/12/2007   PSORIASIS 02/12/2007   DIARRHEA 02/12/2007   KNEE INJURY 02/12/2007    PCP: Arva Chafe, DO  REFERRING PROVIDER:  Hoyt Koch, MD  REFERRING DIAG: R cervical radiculopathy  THERAPY DIAG:  Radiculopathy, cervical region  Stiffness of neck  Muscle right arm weakness  RATIONALE FOR EVALUATION AND TREATMENT: Rehabilitation  ONSET DATE: chronic, years, recently worsening, exacerbated   NEXT MD VISIT: was supposed to be 02/26/24, but needs to call to reschedule   SUBJECTIVE:  SUBJECTIVE STATEMENT: Pt reports he felt really good after the DN session on Friday - felt looser and the numbness went away.  Notes more stiffness this morning with numbness in thumb and first digit - difficulty buttoning his shirt.  Numbness comes and goes.  EVAL: I have had this condition for a long time, and the steroid injections have worked until the last one.  Also my work environment has changed, I was working exclusively at home and they changed our requirements so that now I have to come in 3 x week.  I don't have a consistent work station as I am there 3 x week, but I have to keep my shoulders in a more elevated position, which in turn causes my R arm to go numb and I hurt in the back of my hand.    Hand dominance: Right  PERTINENT HISTORY:  Per referring neurosurgeon's report, referring to PT to try to improve his R radicular Sx to avoid ACDF.  If he has surgery, trying to avoid C6-7 ACDF, as will need to first perform C4-5, C5-6 ACDF.  Pt has h/o RA and HIV, also B wrist fusions previously  PAIN:  Are you having pain? Yes: NPRS scale: 2-3/10 Pain location: R  Pain description: numbness and tingling back of R hand, primarily 1st 2 digits, the numbness extends from R upper traps and side of R shoulder, across L elbow and into radial forearm Aggravating factors: prolonged R shoulder elevation forward, sleeping on that  side Relieving factors: exercise and stretching, lyrica   PRECAUTIONS: Other: RA, HIV  RED FLAGS: None    WEIGHT BEARING RESTRICTIONS: No  FALLS:  Has patient fallen in last 6 months? No  LIVING ENVIRONMENT: Lives with: lives with an adult companion and lives with their partner Lives in: House/apartment Stairs: unknown Has following equipment at home: na  OCCUPATION: works on [employer]  PLOF: Independent  PATIENT GOALS: improve Sx R arm without surgery neck   OBJECTIVE:  Note: Objective measures were completed at Evaluation unless otherwise noted.  DIAGNOSTIC FINDINGS:  Not available , per pt and MD notes compression, loss of disc height C 4/5 and 5/6 and spondylolisthesis C 6/7  PATIENT SURVEYS:  NDI Neck Disability Index score: 12 / 50 = 24.0 %  COGNITION: Overall cognitive status: Within functional limits for tasks assessed  SENSATION: Intermittent numbness primarily post 1st 3 digits , sometimes entire hand  POSTURE: fixed forward head posture originating Cervico thoracic jxn, atrophy noted B deltoids  PALPATION: Some tightness B upper traps, non tender per pt   CERVICAL ROM:   Active ROM Eval 02/08/24  Flexion 70 54  Extension 20 35  Right lateral flexion  32  Left lateral flexion  20  Right rotation 35 35  Left rotation 45 40   (Blank rows = not tested)  UPPER EXTREMITY ROM: B shoulder flexion -15 degrees terminal mvt. B wrists fused in neutral position  UPPER EXTREMITY MMT: R triceps, biceps 4/5 strength B shoulder flexion 4/5  CERVICAL SPECIAL TESTS:  nt  FUNCTIONAL TESTS:    TREATMENT DATE:   3/3//25 THERAPEUTIC EXERCISE: To improve strength, endurance, ROM, and flexibility.  Demonstration, verbal and tactile cues throughout for technique.  NuStep - L 4 x 6 min (UE/LE) Cervical extension SNAGs with pillowcase x 10 Cervical rotation SNAGs R/L with pillowcase x 10 each bil  NEUROMUSCULAR RE-EDUCATION: To improve  coordination, kinesthesia, and posture. Standing RTB triceps extension pulldown 2 x 10 Standing  RTB OH triceps extension x 10 - pt prefers prior version Brachial plexus nerve glide Prone over green Pball I's and Y's x 10 each Lower trap setting at wall - Y wall slide + slight lift off 10 x 3", 2 sets - 2nd set with looped YTB at wrists Serratus 3-way wall clocks x 10 bil   02/05/24 THERAPEUTIC EXERCISE: To improve strength and endurance.  Demonstration, verbal and tactile cues throughout for technique.  UBE - L2.5 x 6 min (3' each fwd & back)  NEUROMUSCULAR RE-EDUCATION: To improve coordination, kinesthesia, posture, and reduce nerve impingement .  R UE medial nerve glide Brachial plexus nerve glide Standing scap retraction into doorframe + RTB B shoulder horiz ABD 2 x 10 Standing scap retraction into doorframe + RTB B shoulder ER 2 x 10  MANUAL THERAPY: To promote normalized muscle tension, improved flexibility, reduced pain, and reduce radicular symptoms  . Trigger Point Dry Needling: Treatment instructions/education: Subsequent Treatment: Instructions provided previously at initial dry needling treatment.  Education Handout Provided: Previously Provided Consent: Patient Verbal Consent Given: Yes Treatment: Muscles Treated: R UT, LS, scalenes, cervical paraspinals and B cervical multifidi Skilled palpation and monitoring of soft tissue during DN Electrical Stimulation Performed: No Treatment Response/Outcome: Twitch Response Elicited, Palpable Increase in Muscle Length, Decreased TTP, Improved Exercise Tolerance, and resolution of R UE numbness and tingling  STM/DTM, manual TPR and pin & stretch to muscles addressed with DN   01/22/24 THERAPEUTIC EXERCISE: To improve strength, endurance, and flexibility.  Demonstration, verbal and tactile cues throughout for technique.  NuStep - L 4 x 6 min (UE only - LE deferred d/t recent procedure on his L buttock)  Seated R gentle UT & LS  stretches 3 x 30" each Hooklying pectoral/anterior chest wall stretch over foam roller (patient reports he has not yet tried this at home as discussed on eval visit) - attempted horizontal ABD, open book position, and snow angels with patient noting best stretch in horizontal ABD position  Hooklying over foam roller - RTB scap retraction + horizontal abduction 10 x 5"  NEUROMUSCULAR RE-EDUCATION: To improve coordination, kinesthetic sense, and reduce nerve impingement .  Median nerve glides and flossing (tray)   MANUAL THERAPY: To promote normalized muscle tension, improved flexibility, improved joint mobility, increased ROM, and reduced pain. Trigger Point Dry Needling: Treatment instructions/education: Initial Treatment: Pt instructed on Dry Needling rational, procedures, and possible side effects. Pt instructed to expect mild to moderate muscle soreness later in the day and/or into the next day.  Pt instructed in methods to reduce muscle soreness. Pt instructed to continue prescribed HEP. Because Dry Needling was performed over or adjacent to a lung field, pt was educated on S/S of pneumothorax and to seek immediate medical attention should they occur.  Patient was educated on signs and symptoms of infection and other risk factors and advised to seek medical attention should they occur.  Patient verbalized understanding of these instructions and education.  Education Handout Provided: Yes Consent: Patient Verbal Consent Given: Yes Treatment: Muscles Treated: R UT and LS Skilled palpation and monitoring of soft tissue during DN Electrical Stimulation Performed: No Treatment Response/Outcome: Twitch Response Elicited, Palpable Increase in Muscle Length, Decreased TTP, and Improved Exercise Tolerance STM/DTM, manual TPR and pin & stretch to muscles addressed with DN   01/07/24: Evaluation, then instructed pt in therex, to adjunct his current program at gym.  Inst in kneeling / prone over  physioball, 65 cm, while performing shoulder horizontal abd with  ER, cues to keep shoulder at 90 degrees abd Also shoulder rows, with terminal ER B  Both exercises to engage middle trapezius musculature  Also instructed to use his foam roller ( which he has at home ) and stretch with it parallel to his spine to provide pectoral/anterior chest wall stretch.     We discussed his work station and he does have a portable lumbar support, advised him to try in his work chair to provide support LS spine                                                                                                                             PATIENT EDUCATION:  Education details: HEP update  Person educated: Patient Education method: Programmer, multimedia, Facilities manager, Verbal cues, and Handouts Education comprehension: verbalized understanding, returned demonstration, verbal cues required, and needs further education  HOME EXERCISE PROGRAM: Access Code: [email or web address] URL: [email or web address] Date: 02/08/2024 Prepared by: Glenetta Hew  Exercises - Prone Middle Trapezius Strengthening on Swiss Ball  - 1 x daily - 7 x weekly - 2 sets - 10 reps - 3 sec hold - Prone on Swiss Ball Shoulder Row to ER to Overhead Reach w/o weight  - 1 x daily - 7 x weekly - 2 sets - 10 reps - 3 sec hold - Seated Upper Trapezius Stretch  - 1-2 x daily - 7 x weekly - 3 reps - 30 sec hold - Gentle Levator Scapulae Stretch  - 1-2 x daily - 7 x weekly - 3 reps - 30 sec hold - Supine Chest Stretch on Foam Roll  - 1 x daily - 7 x weekly - 2 sets - 10 reps - 3 sec hold - Supine Shoulder Horizontal Abduction with Resistance  - 1 x daily - 7 x weekly - 2 sets - 10 reps - 3 sec hold - Standing Median Nerve Glide  - 1 x daily - 7 x weekly - 2 sets - 10 reps - 3 sec hold - Shoulder External Rotation and Scapular Retraction with Resistance  - 1 x daily - 3 x weekly - 2 sets - 10 reps - 3-5 sec hold - Standing Shoulder Horizontal Abduction with  Resistance  - 1 x daily - 3 x weekly - 2 sets - 10 reps - 3 sec hold - Standing Shoulder Diagonal Horizontal Abduction 60/120 Degrees with Resistance  - 1 x daily - 3 x weekly - 2 sets - 10 reps - 3 sec hold - Mid-Lower Cervical Extension SNAG with Strap  - 1 x daily - 7 x weekly - 2 sets - 10 reps - 3 sec hold - Seated Assisted Cervical Rotation with Towel  - 1 x daily - 7 x weekly - 2 sets - 10 reps - 3 sec hold - Standing Elbow Extension with Anchored Resistance  - 1 x daily - 7 x weekly - 2 sets - 10 reps - 3 sec hold - Standing Low Trap  Setting with Resistance at Wall  - 1 x daily - 7 x weekly - 2 sets - 10 reps - 3 sec hold  Patient Education - Trigger Point Dry Needling - Brachial plexus nerve glide   ASSESSMENT:  CLINICAL IMPRESSION: Hudsyn reported good relief of muscle tension and numbness following TPDN last session, however notes increased stiffness this morning with intermittent numbness and tingling. Cervical ROM remains limited w/o significant change since eval, therefore introduced cervical SNAGs with pillowcase to facilitate cervical AAROM.  Triceps strengthening initiated with pt noting preference for pull downs vs OH triceps extension.  Continued emphasis on scapular stabilization and postural strengthening with emphasis on lower traps and serratus muscles.  HEP updated to reflect some of exercise progression.  [place] will benefit from skilled PT to address above deficits to improve mobility and activity tolerance with decreased pain interference.   OBJECTIVE IMPAIRMENTS: decreased ROM, decreased strength, hypomobility, increased fascial restrictions, impaired flexibility, impaired UE functional use, and pain.   ACTIVITY LIMITATIONS: lifting, sitting, sleeping, dressing, and reach over head  PARTICIPATION LIMITATIONS: community activity and occupation  PERSONAL FACTORS: Past/current experiences, Profession, Time since onset of injury/illness/exacerbation, and 1-2 comorbidities:  RA, HIV  are also affecting patient's functional outcome.   REHAB POTENTIAL: Good  CLINICAL DECISION MAKING: Evolving/moderate complexity  EVALUATION COMPLEXITY: Moderate   GOALS: Goals reviewed with patient? Yes  SHORT TERM GOALS: Target date: 2 weeks, 01/21/24  I HEP Baseline:  Goal status: MET - 02/05/24  LONG TERM GOALS: Target date: 03/03/24 8 weeks  Improve NDI score from Neck Disability Index score: 12 / 50 = 24.0 % to 14% Baseline:  Goal status: IN PROGRESS  2.  Improve radicular Sx R UE, patient able to elevate R UE consistently and repetitively without radicular sx R  Baseline: consistent increasing radicular Sx with one rep of R shoulder elevation Goal status: IN PROGRESS  3.  Strength R triceps improve to 5/5 Baseline: 4/5 Goal status: IN PROGRESS    PLAN:  PT FREQUENCY: 1x/week  PT DURATION: 8 weeks  PLANNED INTERVENTIONS: 97110-Therapeutic exercises, 97530- Therapeutic activity, 97112- Neuromuscular re-education, 97535- Self Care, [id number]- Manual therapy, Patient/Family education, Taping, and Dry Needling  PLAN FOR NEXT SESSION: Assess response to TPDN; manual techniques, advance therex   Marry Guan, PT 02/08/2024, 9:40 AM

## 2024-02-15 ENCOUNTER — Encounter: Payer: Self-pay | Admitting: Physical Therapy

## 2024-02-15 ENCOUNTER — Ambulatory Visit: Admitting: Physical Therapy

## 2024-02-15 DIAGNOSIS — M6281 Muscle weakness (generalized): Secondary | ICD-10-CM | POA: Diagnosis not present

## 2024-02-15 DIAGNOSIS — M436 Torticollis: Secondary | ICD-10-CM | POA: Diagnosis not present

## 2024-02-15 DIAGNOSIS — M5412 Radiculopathy, cervical region: Secondary | ICD-10-CM | POA: Diagnosis not present

## 2024-02-15 NOTE — Therapy (Signed)
 OUTPATIENT PHYSICAL THERAPY TREATMENT   Patient Name: Craig Morales MRN: [id number] DOB:01-31-1956, 68 y.o., male Today's Date: 02/15/2024  END OF SESSION:  PT End of Session - 02/15/24 0851     Visit Number 5    Date for PT Re-Evaluation 03/03/24    Authorization Type [employer]    Progress Note Due on Visit 10    PT Start Time 0850    PT Stop Time 0931    PT Time Calculation (min) 41 min    Activity Tolerance Patient tolerated treatment well    Behavior During Therapy [facility] for tasks assessed/performed                 Past Medical History:  Diagnosis Date   GERD (gastroesophageal reflux disease)    HIV (human immunodeficiency virus infection) (HCC)    Seasonal allergies    Past Surgical History:  Procedure Laterality Date   KNEE SURGERY     NASAL SEPTUM SURGERY     Patient Active Problem List   Diagnosis Date Noted   Folliculitis of nose 01/06/2024   Screening examination for sexually transmitted disease 01/06/2024   Medication monitoring encounter 01/06/2024   Acute conjunctivitis of left eye 12/17/2023   Left otitis media 12/17/2023   Erectile dysfunction 10/16/2023   Low testosterone level in male 07/31/2023   Lipodystrophy 07/30/2022   Sensorineural hearing loss (SNHL) of both ears 08/04/2019   Snoring 08/04/2019   Rheumatoid arthritis (HCC) 04/14/2016   Visit for preventive health examination 10/07/2013   Gout 10/07/2013   History of calcium pyrophosphate deposition disease (CPPD) 07/25/2013   Degenerative arthritis of spine 11/27/2011   EPISTAXIS, RECURRENT 11/26/2010   IRRITABLE BOWEL SYNDROME 05/23/2008   Herpes simplex virus (HSV) infection 02/23/2007   GERD 02/23/2007   Human immunodeficiency virus (HIV) disease (HCC) 02/12/2007   Herpes zoster 02/12/2007   DEVIATED NASAL SEPTUM 02/12/2007   Allergic rhinitis 02/12/2007   PSORIASIS 02/12/2007   DIARRHEA 02/12/2007   KNEE INJURY 02/12/2007    PCP: Arva Chafe, DO  REFERRING PROVIDER:  Hoyt Koch, MD  REFERRING DIAG: R cervical radiculopathy  THERAPY DIAG:  Radiculopathy, cervical region  Stiffness of neck  Muscle right arm weakness  RATIONALE FOR EVALUATION AND TREATMENT: Rehabilitation  ONSET DATE: chronic, years, recently worsening, exacerbated   NEXT MD VISIT: was supposed to be 02/26/24, but needs to call to reschedule   SUBJECTIVE:  SUBJECTIVE STATEMENT: 02/15/2024 Tight this morning, and my right hand is a little tingly, numb.   This morning it was really numb and about 4/10, better now.     EVAL: I have had this condition for a long time, and the steroid injections have worked until the last one.  Also my work environment has changed, I was working exclusively at home and they changed our requirements so that now I have to come in 3 x week.  I don't have a consistent work station as I am there 3 x week, but I have to keep my shoulders in a more elevated position, which in turn causes my R arm to go numb and I hurt in the back of my hand.    Hand dominance: Right  PERTINENT HISTORY:  Per referring neurosurgeon's report, referring to PT to try to improve his R radicular Sx to avoid ACDF.  If he has surgery, trying to avoid C6-7 ACDF, as will need to first perform C4-5, C5-6 ACDF.  Pt has h/o RA and HIV, also B wrist fusions previously  PAIN:  Are you having pain? Yes: NPRS scale: 3/10 Pain location: R  Pain description: numbness and tingling back of R hand, primarily 1st 2 digits, the numbness extends from R upper traps and side of R shoulder, across L elbow and into radial forearm Aggravating factors: prolonged R shoulder elevation forward, sleeping on that side Relieving factors: exercise and stretching, lyrica   PRECAUTIONS: Other: RA, HIV  RED  FLAGS: None    WEIGHT BEARING RESTRICTIONS: No  FALLS:  Has patient fallen in last 6 months? No  LIVING ENVIRONMENT: Lives with: lives with an adult companion and lives with their partner Lives in: House/apartment Stairs: unknown Has following equipment at home: na  OCCUPATION: works on [employer]  PLOF: Independent  PATIENT GOALS: improve Sx R arm without surgery neck   OBJECTIVE:  Note: Objective measures were completed at Evaluation unless otherwise noted.  DIAGNOSTIC FINDINGS:  Not available , per pt and MD notes compression, loss of disc height C 4/5 and 5/6 and spondylolisthesis C 6/7  PATIENT SURVEYS:  NDI Neck Disability Index score: 12 / 50 = 24.0 %  COGNITION: Overall cognitive status: Within functional limits for tasks assessed  SENSATION: Intermittent numbness primarily post 1st 3 digits , sometimes entire hand  POSTURE: fixed forward head posture originating Cervico thoracic jxn, atrophy noted B deltoids  PALPATION: Some tightness B upper traps, non tender per pt   CERVICAL ROM:   Active ROM Eval 02/08/24  Flexion 70 54  Extension 20 35  Right lateral flexion  32  Left lateral flexion  20  Right rotation 35 35  Left rotation 45 40   (Blank rows = not tested)  UPPER EXTREMITY ROM: B shoulder flexion -15 degrees terminal mvt. B wrists fused in neutral position  UPPER EXTREMITY MMT: R triceps, biceps 4/5 strength B shoulder flexion 4/5  CERVICAL SPECIAL TESTS:  nt  FUNCTIONAL TESTS:    TREATMENT DATE:   02/15/24 Therapeutic Exercise: to improve strength and mobility.  Demo, verbal and tactile cues throughout for technique. UBE L2 forward 4 min, backwards 3 min  Resisted chin tucks RTB 3 x 5 - increased symptoms R side Supine chin tucks against pillow Neuromuscular Reeducation: to decrease nerve irritability and centralize symptoms.  1st rib mobs seated with strap Median nerve glides Manual Therapy: to decrease muscle spasm  and pain and improve mobility 1st rib mobs, STM/TPR  to R UT, R SCM, R scalenes, skilled palpation and monitoring during dry needling. Trigger Point Dry Needling  Subsequent Treatment: Instructions provided previously at initial dry needling treatment.  Instructions reviewed, if requested by the patient, prior to subsequent dry needling treatment.   Patient Verbal Consent Given: Yes Education Handout Provided: Yes Muscles Treated: cervical multifidi C5-6 bil, R UT Electrical Stimulation Performed: No Treatment Response/Outcome: Twitch Response Elicited and Palpable Increase in Muscle Length   3/3//25 THERAPEUTIC EXERCISE: To improve strength, endurance, ROM, and flexibility.  Demonstration, verbal and tactile cues throughout for technique.  NuStep - L 4 x 6 min (UE/LE) Cervical extension SNAGs with pillowcase x 10 Cervical rotation SNAGs R/L with pillowcase x 10 each bil  NEUROMUSCULAR RE-EDUCATION: To improve coordination, kinesthesia, and posture. Standing RTB triceps extension pulldown 2 x 10 Standing RTB OH triceps extension x 10 - pt prefers prior version Brachial plexus nerve glide Prone over green Pball I's and Y's x 10 each Lower trap setting at wall - Y wall slide + slight lift off 10 x 3", 2 sets - 2nd set with looped YTB at wrists Serratus 3-way wall clocks x 10 bil   02/05/24 THERAPEUTIC EXERCISE: To improve strength and endurance.  Demonstration, verbal and tactile cues throughout for technique.  UBE - L2.5 x 6 min (3' each fwd & back)  NEUROMUSCULAR RE-EDUCATION: To improve coordination, kinesthesia, posture, and reduce nerve impingement .  R UE medial nerve glide Brachial plexus nerve glide Standing scap retraction into doorframe + RTB B shoulder horiz ABD 2 x 10 Standing scap retraction into doorframe + RTB B shoulder ER 2 x 10  MANUAL THERAPY: To promote normalized muscle tension, improved flexibility, reduced pain, and reduce radicular symptoms  . Trigger Point  Dry Needling: Treatment instructions/education: Subsequent Treatment: Instructions provided previously at initial dry needling treatment.  Education Handout Provided: Previously Provided Consent: Patient Verbal Consent Given: Yes Treatment: Muscles Treated: R UT, LS, scalenes, cervical paraspinals and B cervical multifidi Skilled palpation and monitoring of soft tissue during DN Electrical Stimulation Performed: No Treatment Response/Outcome: Twitch Response Elicited, Palpable Increase in Muscle Length, Decreased TTP, Improved Exercise Tolerance, and resolution of R UE numbness and tingling  STM/DTM, manual TPR and pin & stretch to muscles addressed with DN                                                                                                                         PATIENT EDUCATION:  Education details: HEP update  Person educated: Patient Education method: Explanation, Demonstration, Verbal cues, and Handouts Education comprehension: verbalized understanding, returned demonstration, verbal cues required, and needs further education  HOME EXERCISE PROGRAM: Access Code: [email or web address] URL: [email or web address] Date: 02/15/2024 Prepared by: Harrie Foreman  Exercises - Prone Middle Trapezius Strengthening on Swiss Ball  - 1 x daily - 7 x weekly - 2 sets - 10 reps - 3 sec hold - Prone on Swiss Ball Shoulder Row to ER to Overhead Reach w/o weight  -  1 x daily - 7 x weekly - 2 sets - 10 reps - 3 sec hold - Seated Upper Trapezius Stretch  - 1-2 x daily - 7 x weekly - 3 reps - 30 sec hold - Gentle Levator Scapulae Stretch  - 1-2 x daily - 7 x weekly - 3 reps - 30 sec hold - Supine Chest Stretch on Foam Roll  - 1 x daily - 7 x weekly - 2 sets - 10 reps - 3 sec hold - Supine Shoulder Horizontal Abduction with Resistance  - 1 x daily - 7 x weekly - 2 sets - 10 reps - 3 sec hold - Standing Median Nerve Glide  - 1 x daily - 7 x weekly - 2 sets - 10 reps - 3 sec hold -  Shoulder External Rotation and Scapular Retraction with Resistance  - 1 x daily - 3 x weekly - 2 sets - 10 reps - 3-5 sec hold - Standing Shoulder Horizontal Abduction with Resistance  - 1 x daily - 3 x weekly - 2 sets - 10 reps - 3 sec hold - Standing Shoulder Diagonal Horizontal Abduction 60/120 Degrees with Resistance  - 1 x daily - 3 x weekly - 2 sets - 10 reps - 3 sec hold - Mid-Lower Cervical Extension SNAG with Strap  - 1 x daily - 7 x weekly - 2 sets - 10 reps - 3 sec hold - Seated Assisted Cervical Rotation with Towel  - 1 x daily - 7 x weekly - 2 sets - 10 reps - 3 sec hold - Standing Elbow Extension with Anchored Resistance  - 1 x daily - 7 x weekly - 2 sets - 10 reps - 3 sec hold - Standing Low Trap Setting with Resistance at Wall  - 1 x daily - 7 x weekly - 2 sets - 10 reps - 3 sec hold - First Rib Mobilization with Strap  - 1 x daily - 7 x weekly - 1 sets - 10 reps  Patient Education - Trigger Point Dry Needling - Brachial plexus nerve glide   ASSESSMENT:  CLINICAL IMPRESSION: Imani continues to report intermittant numbness and tingling in 1st 3 digits of R hand, especially in morning.  Today aggravated with positioning when tried resisted chin tucks with bands.  When placed in prone symptoms improved significantly in RUE.  Since reported good improvement with TrDN in past, again performed TrDN in conjuction with manual therapy to cervical paraspinals and R UT with strong twitch response today.   Jamier will benefit from skilled PT to address above deficits to improve mobility and activity tolerance with decreased pain interference.   OBJECTIVE IMPAIRMENTS: decreased ROM, decreased strength, hypomobility, increased fascial restrictions, impaired flexibility, impaired UE functional use, and pain.   ACTIVITY LIMITATIONS: lifting, sitting, sleeping, dressing, and reach over head  PARTICIPATION LIMITATIONS: community activity and occupation  PERSONAL FACTORS: Past/current experiences,  Profession, Time since onset of injury/illness/exacerbation, and 1-2 comorbidities: RA, HIV  are also affecting patient's functional outcome.   REHAB POTENTIAL: Good  CLINICAL DECISION MAKING: Evolving/moderate complexity  EVALUATION COMPLEXITY: Moderate   GOALS: Goals reviewed with patient? Yes  SHORT TERM GOALS: Target date: 2 weeks, 01/21/24  I HEP Baseline:  Goal status: MET - 02/05/24  LONG TERM GOALS: Target date: 03/03/24 8 weeks  Improve NDI score from Neck Disability Index score: 12 / 50 = 24.0 % to 14% Baseline:  Goal status: IN PROGRESS  2.  Improve radicular Sx R UE,  patient able to elevate R UE consistently and repetitively without radicular sx R  Baseline: consistent increasing radicular Sx with one rep of R shoulder elevation Goal status: IN PROGRESS  3.  Strength R triceps improve to 5/5 Baseline: 4/5 Goal status: IN PROGRESS    PLAN:  PT FREQUENCY: 1x/week  PT DURATION: 8 weeks  PLANNED INTERVENTIONS: 97110-Therapeutic exercises, 97530- Therapeutic activity, 97112- Neuromuscular re-education, 97535- Self Care, 16109- Manual therapy, Patient/Family education, Taping, and Dry Needling  PLAN FOR NEXT SESSION: Assess response to TPDN; manual techniques, advance therex   Jena Gauss, PT 02/15/2024, 4:36 PM

## 2024-02-16 ENCOUNTER — Ambulatory Visit: Payer: BC Managed Care – PPO | Admitting: Physical Therapy

## 2024-02-22 ENCOUNTER — Encounter: Payer: Self-pay | Admitting: Family Medicine

## 2024-02-24 ENCOUNTER — Ambulatory Visit: Admitting: Physical Therapy

## 2024-02-24 ENCOUNTER — Encounter: Payer: Self-pay | Admitting: Physical Therapy

## 2024-02-24 DIAGNOSIS — M5412 Radiculopathy, cervical region: Secondary | ICD-10-CM

## 2024-02-24 DIAGNOSIS — M436 Torticollis: Secondary | ICD-10-CM | POA: Diagnosis not present

## 2024-02-24 DIAGNOSIS — M6281 Muscle weakness (generalized): Secondary | ICD-10-CM | POA: Diagnosis not present

## 2024-02-24 NOTE — Therapy (Signed)
 OUTPATIENT PHYSICAL THERAPY TREATMENT  Progress Note  Reporting Period 01/07/2024 to 02/24/2024   See note below for Objective Data and Assessment of Progress/Goals.     Patient Name: Craig Morales MRN: [id number] DOB:[birth year], 68 y.o., male Today's Date: 02/24/2024  END OF SESSION:  PT End of Session - 02/24/24 0804     Visit Number 6    Date for PT Re-Evaluation 03/03/24    Authorization Type BC[employer]    Progress Note Due on Visit 10    PT Start Time 0804    PT Stop Time 0845    PT Time Calculation (min) 41 min    Activity Tolerance Patient tolerated treatment well    Behavior During Therapy [facility] for tasks assessed/performed                  Past Medical History:  Diagnosis Date   GERD (gastroesophageal reflux disease)    HIV (human immunodeficiency virus infection) (HCC)    Seasonal allergies    Past Surgical History:  Procedure Laterality Date   KNEE SURGERY     NASAL SEPTUM SURGERY     Patient Active Problem List   Diagnosis Date Noted   Folliculitis of nose 01/06/2024   Screening examination for sexually transmitted disease 01/06/2024   Medication monitoring encounter 01/06/2024   Acute conjunctivitis of left eye 12/17/2023   Left otitis media 12/17/2023   Erectile dysfunction 10/16/2023   Low testosterone level in male 07/31/2023   Lipodystrophy 07/30/2022   Sensorineural hearing loss (SNHL) of both ears 08/04/2019   Snoring 08/04/2019   Rheumatoid arthritis (HCC) 04/14/2016   Visit for preventive health examination 10/07/2013   Gout 10/07/2013   History of calcium pyrophosphate deposition disease (CPPD) 07/25/2013   Degenerative arthritis of spine 11/27/2011   EPISTAXIS, RECURRENT 11/26/2010   IRRITABLE BOWEL SYNDROME 05/23/2008   Herpes simplex virus (HSV) infection 02/23/2007   GERD 02/23/2007   Human immunodeficiency virus (HIV) disease (HCC) 02/12/2007   Herpes zoster 02/12/2007   DEVIATED NASAL SEPTUM 02/12/2007   Allergic rhinitis  02/12/2007   PSORIASIS 02/12/2007   DIARRHEA 02/12/2007   KNEE INJURY 02/12/2007    PCP: Arva Chafe, DO  REFERRING PROVIDER: Hoyt Koch, MD  REFERRING DIAG: R cervical radiculopathy  THERAPY DIAG:  Radiculopathy, cervical region  Stiffness of neck  Muscle right arm weakness  RATIONALE FOR EVALUATION AND TREATMENT: Rehabilitation  ONSET DATE: chronic, years, recently worsening, exacerbated   NEXT MD VISIT:  02/29/24   SUBJECTIVE:  SUBJECTIVE STATEMENT: 02/24/2024 - Pt reports the DN last visit helped to subside the numbness and tingling flare-up he was experiencing.  He has had some numbness and tingling this morning which improved with his HEP stretches.   EVAL: I have had this condition for a long time, and the steroid injections have worked until the last one.  Also my work environment has changed, I was working exclusively at home and they changed our requirements so that now I have to come in 3 x week.  I don't have a consistent work station as I am there 3 x week, but I have to keep my shoulders in a more elevated position, which in turn causes my R arm to go numb and I hurt in the back of my hand.    Hand dominance: Right  PERTINENT HISTORY:  Per referring neurosurgeon's report, referring to PT to try to improve his R radicular Sx to avoid ACDF.  If he has surgery, trying to avoid C6-7 ACDF, as will need to first perform C4-5, C5-6 ACDF.  Pt has h/o RA and HIV, also B wrist fusions previously  PAIN:  Are you having pain? Yes: NPRS scale: 3/10 Pain location: R hand - middle finger   Pain description: numbness and tingling back of R hand, primarily 1st 2 digits, the numbness extends from R upper traps and side of R shoulder, across L elbow and into radial  forearm Aggravating factors: prolonged R shoulder elevation forward, sleeping on that side Relieving factors: exercise and stretching, lyrica   PRECAUTIONS: Other: RA, HIV  RED FLAGS: None    WEIGHT BEARING RESTRICTIONS: No  FALLS:  Has patient fallen in last 6 months? No  LIVING ENVIRONMENT: Lives with: lives with an adult companion and lives with their partner Lives in: House/apartment Stairs: unknown Has following equipment at home: na  OCCUPATION: works on [employer]  PLOF: Independent  PATIENT GOALS: improve Sx R arm without surgery neck   OBJECTIVE:  Note: Objective measures were completed at Evaluation unless otherwise noted.  DIAGNOSTIC FINDINGS:  Not available , per pt and MD notes compression, loss of disc height C 4/5 and 5/6 and spondylolisthesis C 6/7  PATIENT SURVEYS:  NDI Neck Disability Index score: 12 / 50 = 24.0 %  COGNITION: Overall cognitive status: Within functional limits for tasks assessed  SENSATION: Intermittent numbness primarily post 1st 3 digits , sometimes entire hand  POSTURE: fixed forward head posture originating Cervico thoracic jxn, atrophy noted B deltoids  PALPATION: Some tightness B upper traps, non tender per pt   CERVICAL ROM:   Active ROM Eval 02/08/24  Flexion 70 54  Extension 20 35  Right lateral flexion  32  Left lateral flexion  20  Right rotation 35 35  Left rotation 45 40   (Blank rows = not tested)  UPPER EXTREMITY ROM: B shoulder flexion -15 degrees terminal mvt. B wrists fused in neutral position  UPPER EXTREMITY MMT: R triceps, biceps 4/5 strength B shoulder flexion 4/5  CERVICAL SPECIAL TESTS:  nt  FUNCTIONAL TESTS:    TREATMENT DATE:   02/24/2024  THERAPEUTIC EXERCISE: To improve strength, endurance, ROM, and flexibility.  Demonstration, verbal and tactile cues throughout for technique.  NuStep - L5 x 6 min (UE/LE) Verbal review and consolidation of HEP  MANUAL THERAPY: To  promote normalized muscle tension, improved flexibility, improved joint mobility, increased ROM, and reduced pain. Trigger Point Dry Needling: Treatment instructions/education: Subsequent Treatment: Instructions provided previously at initial  dry needling treatment.  Education Handout Provided: Previously Provided Consent: Patient Verbal Consent Given: Yes Treatment: Muscles Treated: R UT and scalenes, B cervical paraspinals Skilled palpation and monitoring of soft tissue during DN Electrical Stimulation Performed: No Treatment Response/Outcome: Twitch response elicited, Palpable increase in muscle length, Decreased tissue resistance noted, and Decreased TTP STM/DTM, manual TPR and pin & stretch to muscles addressed with DN    02/15/24 Therapeutic Exercise: to improve strength and mobility.  Demo, verbal and tactile cues throughout for technique. UBE L2 forward 4 min, backwards 3 min  Resisted chin tucks RTB 3 x 5 - increased symptoms R side Supine chin tucks against pillow Neuromuscular Reeducation: to decrease nerve irritability and centralize symptoms.  1st rib mobs seated with strap Median nerve glides Manual Therapy: to decrease muscle spasm and pain and improve mobility 1st rib mobs, STM/TPR to R UT, R SCM, R scalenes, skilled palpation and monitoring during dry needling. Trigger Point Dry Needling  Subsequent Treatment: Instructions provided previously at initial dry needling treatment.  Instructions reviewed, if requested by the patient, prior to subsequent dry needling treatment.   Patient Verbal Consent Given: Yes Education Handout Provided: Yes Muscles Treated: cervical multifidi C5-6 bil, R UT Electrical Stimulation Performed: No Treatment Response/Outcome: Twitch Response Elicited and Palpable Increase in Muscle Length   3/3//25 THERAPEUTIC EXERCISE: To improve strength, endurance, ROM, and flexibility.  Demonstration, verbal and tactile cues throughout for  technique.  NuStep - L 4 x 6 min (UE/LE) Cervical extension SNAGs with pillowcase x 10 Cervical rotation SNAGs R/L with pillowcase x 10 each bil  NEUROMUSCULAR RE-EDUCATION: To improve coordination, kinesthesia, and posture. Standing RTB triceps extension pulldown 2 x 10 Standing RTB OH triceps extension x 10 - pt prefers prior version Brachial plexus nerve glide Prone over green Pball I's and Y's x 10 each Lower trap setting at wall - Y wall slide + slight lift off 10 x 3", 2 sets - 2nd set with looped YTB at wrists Serratus 3-way wall clocks x 10 bil           PATIENT EDUCATION:  Education details: HEP review, HEP consolidation, recommended frequency for ongoing HEP at discharge to prevent loss of gains achieved with PT, role of DN, and DN rational, procedure, outcomes, potential side effects, and recommended post-treatment exercises/activity  Person educated: Patient Education method: Explanation and Handouts Education comprehension: verbalized understanding  HOME EXERCISE PROGRAM: Access Code: [email or web address] URL: [email or web address] Date: 02/24/2024 Prepared by: Glenetta Hew  Exercises - Seated Upper Trapezius Stretch  - 1-2 x daily - 7 x weekly - 3 reps - 30 sec hold - Gentle Levator Scapulae Stretch  - 1-2 x daily - 7 x weekly - 3 reps - 30 sec hold - Mid-Lower Cervical Extension SNAG with Strap  - 1 x daily - 7 x weekly - 2 sets - 10 reps - 3 sec hold - Seated Assisted Cervical Rotation with Towel  - 1 x daily - 7 x weekly - 2 sets - 10 reps - 3 sec hold - First Rib Mobilization with Strap  - 1 x daily - 7 x weekly - 1 sets - 10 reps - Standing Median Nerve Glide  - 1 x daily - 7 x weekly - 2 sets - 10 reps - 3 sec hold - Shoulder External Rotation and Scapular Retraction with Resistance  - 1 x daily - 3 x weekly - 2 sets - 10 reps - 3-5 sec hold - Prone Middle Trapezius Strengthening on  Swiss Ball  - 1 x daily - 3 x weekly - 2 sets - 10 reps - 3 sec hold - Prone  on Swiss Ball Shoulder Row to ER to Overhead Reach w/o weight  - 1 x daily - 3 x weekly - 2 sets - 10 reps - 3 sec hold - Standing Shoulder Horizontal Abduction with Resistance  - 1 x daily - 3 x weekly - 2 sets - 10 reps - 3 sec hold - Standing Shoulder Diagonal Horizontal Abduction 60/120 Degrees with Resistance  - 1 x daily - 3 x weekly - 2 sets - 10 reps - 3 sec hold - Standing Elbow Extension with Anchored Resistance  - 1 x daily - 3 x weekly - 2 sets - 10 reps - 3 sec hold - Standing Low Trap Setting with Resistance at Wall  - 1 x daily - 3 x weekly - 2 sets - 10 reps - 3 sec hold  Patient Education - Trigger Point Dry Needling - Brachial plexus nerve glide   ASSESSMENT:  CLINICAL IMPRESSION: Deaveon feels that PT has helped (80% improvement) but does not think he will be fully be able to alleviate his symptoms w/o surgery which he still wishes to avoid.  He feels that he has good awareness of the strategies (nerve glides and exercises) that are helpful in alleviating the radicular symptoms when they do present. Majority of PT goals now met and Bernarr feels ready to transition to his HEP but would like to remain on hold for 30-days in the event that issues arise that would necessitate a return to PT.  OBJECTIVE IMPAIRMENTS: decreased ROM, decreased strength, hypomobility, increased fascial restrictions, impaired flexibility, impaired UE functional use, and pain.   ACTIVITY LIMITATIONS: lifting, sitting, sleeping, dressing, and reach over head  PARTICIPATION LIMITATIONS: community activity and occupation  PERSONAL FACTORS: Past/current experiences, Profession, Time since onset of injury/illness/exacerbation, and 1-2 comorbidities: RA, HIV  are also affecting patient's functional outcome.   REHAB POTENTIAL: Good  CLINICAL DECISION MAKING: Evolving/moderate complexity  EVALUATION COMPLEXITY: Moderate   GOALS: Goals reviewed with patient? Yes  SHORT TERM GOALS: Target date: 2 weeks,  01/21/24  I HEP Baseline:  Goal status: MET - 02/05/24  LONG TERM GOALS: Target date: 03/03/24 8 weeks  Improve NDI score from Neck Disability Index score: 12 / 50 = 24.0 % to 14% Baseline: 12 / 50 = 24.0 % Goal status: NOT MET - 02/24/24 - 14 / 50 = 28.0 %  2.  Improve radicular Sx R UE, patient able to elevate R UE consistently and repetitively without radicular sx R  Baseline: consistent increasing radicular Sx with one rep of R shoulder elevation Goal status: MET - 02/24/24  3.  Strength R triceps improve to 5/5 Baseline: 4/5 Goal status: MET - 02/24/24 - 5/5    PLAN:  PT FREQUENCY: 1x/week  PT DURATION: 8 weeks  PLANNED INTERVENTIONS: 97110-Therapeutic exercises, 97530- Therapeutic activity, 97112- Neuromuscular re-education, 97535- Self Care, [address]- Manual therapy, Patient/Family education, Taping, and Dry Needling  PLAN FOR NEXT SESSION: transition to HEP + 30-day hold   Marry Guan, PT 02/24/2024, 9:52 AM

## 2024-02-29 DIAGNOSIS — M5412 Radiculopathy, cervical region: Secondary | ICD-10-CM | POA: Diagnosis not present

## 2024-03-08 MED ORDER — IRON GLYCINATE 29 MG PO CAPS: 1.0000 | ORAL_CAPSULE | Freq: Every day | ORAL | 1 refills | Status: DC

## 2024-03-28 ENCOUNTER — Other Ambulatory Visit: Payer: Self-pay

## 2024-03-28 MED ORDER — IRON GLYCINATE 29 MG PO CAPS: 1.0000 | ORAL_CAPSULE | Freq: Every day | ORAL | 1 refills | Status: AC

## 2024-03-28 NOTE — Progress Notes (Signed)
 [facility] [address] [place] [address] Phone: [phone]   Assessment and Plan:     There are no diagnoses linked to this encounter.  ***   Pertinent previous records reviewed include ***    Follow Up: ***     Subjective:   I, Amylah Will, am serving as a Neurosurgeon for Doctor Ulysees Gander   Chief Complaint: right knee drain    HPI:    02/06/22 Patient is a 68 year old male complaining of right knee pain. Patient states that his knee has been swelling for two weeks, really sore around the knee cap and its getting harder getting up and down because of his dogs , its causing him a lot of pain, no MOI only thing he can think of is when he was going down to his dog and maybe twisting his knee but doesn't know for sure. States there is a lot of heat to his knee, has been taking tylenol  and that's not really helping, does not feel any clicking or popping , does not get numbness or tingling. Having to alter gait is starting to aggravates his bursitis , is having hernia surgery in the middle of march   03/29/2024 Patient states   Relevant Historical Information: Rheumatoid arthritis  Additional pertinent review of systems negative.   Current Outpatient Medications:    Ascorbic Acid (VITAMIN C WITH ROSE HIPS) 500 MG tablet, Take 500 mg by mouth daily., Disp: , Rfl:    bictegravir-emtricitabine-tenofovir  AF (BIKTARVY ) 50-200-25 MG TABS tablet, Take 1 tablet by mouth daily., Disp: 90 tablet, Rfl: 3   Digestive Enzymes (DIGESTIVE ENZYME PO), Take by mouth., Disp: , Rfl:    fluticasone  (FLONASE ) 50 MCG/ACT nasal spray, USE TWO SPRAY(S) IN EACH NOSTRIL ONCE DAILY, Disp: 16 g, Rfl: 6   folic acid  (FOLVITE ) 1 MG tablet, Take 1 mg by mouth daily., Disp: , Rfl:    Iron  Glycinate 29 MG CAPS, Take 1 capsule by mouth daily., Disp: 30 capsule, Rfl: 1   KRILL OIL OMEGA-3 PO, Take 1 each by mouth daily., Disp: , Rfl:    Melatonin 5  MG CAPS, Take 1 each by mouth at bedtime., Disp: , Rfl:    methotrexate  2.5 MG tablet, Take 20 mg by mouth once a week., Disp: , Rfl:    Misc Natural Products (OSTEO BI-FLEX ADV JOINT SHIELD) TABS, Take 1 each by mouth daily., Disp: , Rfl:    Multiple Vitamins-Minerals (CENTRUM SILVER ADULT 50+ PO), Take 1 each by mouth daily., Disp: , Rfl:    Multiple Vitamins-Minerals (PRESERVISION AREDS PO), , Disp: , Rfl:    neomycin -polymyxin-hydrocortisone (CORTISPORIN) 3.5-10000-1 ophthalmic suspension, Place 3 drops into both eyes 3 (three) times daily., Disp: 7.5 mL, Rfl: 0   ondansetron  (ZOFRAN -ODT) 8 MG disintegrating tablet, Take 1 tablet (8 mg total) by mouth every 8 (eight) hours as needed for nausea or vomiting., Disp: 20 tablet, Rfl: 0   pantoprazole  (PROTONIX ) 40 MG tablet, Take 40 mg by mouth daily., Disp: , Rfl:    pregabalin  (LYRICA ) 100 MG capsule, Take 1 tablet by mouth daily., Disp: , Rfl:    pregabalin  (LYRICA ) 25 MG capsule, take one capsule po q am, Disp: , Rfl:    Probiotic Product (PROBIOTIC-10 PO), Take by mouth., Disp: , Rfl:    Propylene Glycol (SYSTANE COMPLETE OP), , Disp: , Rfl:    sildenafil  (VIAGRA ) 100 MG tablet, Take 0.5-1 tablets (50-100 mg total) by mouth  daily as needed for erectile dysfunction., Disp: 30 tablet, Rfl: 1   valACYclovir  (VALTREX ) 500 MG tablet, Take 1 tablet (500 mg total) by mouth 2 (two) times daily. Take 1 tablet (500 mg total) by mouth 2 (two) times daily as needed., Disp: 90 tablet, Rfl: 3   Objective:     There were no vitals filed for this visit.    There is no height or weight on file to calculate BMI.    Physical Exam:    ***   Electronically signed by:  Marshall Skeeter [facility] 12:53 PM 03/28/24

## 2024-03-29 ENCOUNTER — Other Ambulatory Visit: Payer: Self-pay

## 2024-03-29 ENCOUNTER — Ambulatory Visit: Admitting: Sports Medicine

## 2024-03-29 VITALS — HR 64 | Ht 71.0 in | Wt 157.0 lb

## 2024-03-29 DIAGNOSIS — M25561 Pain in right knee: Secondary | ICD-10-CM

## 2024-03-29 DIAGNOSIS — G8929 Other chronic pain: Secondary | ICD-10-CM

## 2024-03-29 DIAGNOSIS — M7041 Prepatellar bursitis, right knee: Secondary | ICD-10-CM

## 2024-03-29 NOTE — Patient Instructions (Signed)
 As needed follow up if no improvement Be cautious to not kneel on objects for the next 2 weeks

## 2024-04-13 ENCOUNTER — Telehealth: Payer: Self-pay

## 2024-04-13 NOTE — Telephone Encounter (Signed)
 Spoke with the Pt about prescription not pills.

## 2024-04-13 NOTE — Telephone Encounter (Signed)
 Called pt was advised that we don't have  iron  supplements, Pt stated he needed to know what his [facility] card will pay for. Tolf him he will need to call his insurance, Pt stated understand.

## 2024-04-13 NOTE — Telephone Encounter (Signed)
 Copied from CRM [id number]. Topic: General - Other >> Apr 13, 2024  1:45 PM Dorisann Garre T wrote: Reason for CRM: patient is calling in to see if the iron  supplements are still at the front he wanted to come back and pick them up today if there still there he would like a call back regarding this

## 2024-04-19 NOTE — Progress Notes (Signed)
 The 10-year ASCVD risk score (Arnett DK, et al., 2019) is: 12.5%   Values used to calculate the score:     Age: 68 years     Sex: Male     Is Non-Hispanic African American: No     Diabetic: No     Tobacco smoker: No     Systolic Blood Pressure: 125 mmHg     Is BP treated: No     HDL Cholesterol: 57.4 mg/dL     Total Cholesterol: 159 mg/dL  Arlon Bergamo, BSN, RN

## 2024-04-21 DIAGNOSIS — M069 Rheumatoid arthritis, unspecified: Secondary | ICD-10-CM | POA: Diagnosis not present

## 2024-04-21 DIAGNOSIS — M7041 Prepatellar bursitis, right knee: Secondary | ICD-10-CM | POA: Diagnosis not present

## 2024-04-21 DIAGNOSIS — M5412 Radiculopathy, cervical region: Secondary | ICD-10-CM | POA: Diagnosis not present

## 2024-04-21 DIAGNOSIS — Z79899 Other long term (current) drug therapy: Secondary | ICD-10-CM | POA: Diagnosis not present

## 2024-06-06 DIAGNOSIS — Z79899 Other long term (current) drug therapy: Secondary | ICD-10-CM | POA: Diagnosis not present

## 2024-07-01 DIAGNOSIS — M4802 Spinal stenosis, cervical region: Secondary | ICD-10-CM | POA: Diagnosis not present

## 2024-07-01 DIAGNOSIS — M47812 Spondylosis without myelopathy or radiculopathy, cervical region: Secondary | ICD-10-CM | POA: Diagnosis not present

## 2024-08-02 DIAGNOSIS — M069 Rheumatoid arthritis, unspecified: Secondary | ICD-10-CM | POA: Diagnosis not present

## 2024-08-02 DIAGNOSIS — Z79899 Other long term (current) drug therapy: Secondary | ICD-10-CM | POA: Diagnosis not present

## 2024-08-16 DIAGNOSIS — R195 Other fecal abnormalities: Secondary | ICD-10-CM | POA: Diagnosis not present

## 2024-08-16 DIAGNOSIS — E611 Iron deficiency: Secondary | ICD-10-CM | POA: Diagnosis not present

## 2024-08-16 DIAGNOSIS — R194 Change in bowel habit: Secondary | ICD-10-CM | POA: Diagnosis not present

## 2024-08-16 DIAGNOSIS — K219 Gastro-esophageal reflux disease without esophagitis: Secondary | ICD-10-CM | POA: Diagnosis not present

## 2024-09-22 ENCOUNTER — Ambulatory Visit: Payer: Self-pay | Admitting: Medical

## 2024-09-22 ENCOUNTER — Ambulatory Visit: Payer: Self-pay

## 2024-09-22 ENCOUNTER — Telehealth (INDEPENDENT_AMBULATORY_CARE_PROVIDER_SITE_OTHER): Admitting: Medical

## 2024-09-22 ENCOUNTER — Other Ambulatory Visit (HOSPITAL_BASED_OUTPATIENT_CLINIC_OR_DEPARTMENT_OTHER): Payer: Self-pay

## 2024-09-22 ENCOUNTER — Other Ambulatory Visit

## 2024-09-22 VITALS — BP 102/60 | HR 67 | Temp 98.3°F | Resp 15 | Ht 71.0 in

## 2024-09-22 DIAGNOSIS — R197 Diarrhea, unspecified: Secondary | ICD-10-CM

## 2024-09-22 DIAGNOSIS — R110 Nausea: Secondary | ICD-10-CM | POA: Diagnosis not present

## 2024-09-22 DIAGNOSIS — R5383 Other fatigue: Secondary | ICD-10-CM | POA: Diagnosis not present

## 2024-09-22 DIAGNOSIS — B20 Human immunodeficiency virus [HIV] disease: Secondary | ICD-10-CM | POA: Diagnosis not present

## 2024-09-22 LAB — CBC WITH DIFFERENTIAL/PLATELET
Basophils Absolute: 0 K/uL (ref 0.0–0.1)
Basophils Relative: 0.4 % (ref 0.0–3.0)
Eosinophils Absolute: 0 K/uL (ref 0.0–0.7)
Eosinophils Relative: 0 % (ref 0.0–5.0)
HCT: 45.5 % (ref 39.0–52.0)
Hemoglobin: 15.4 g/dL (ref 13.0–17.0)
Lymphocytes Relative: 7.3 % — ABNORMAL LOW (ref 12.0–46.0)
Lymphs Abs: 0.9 K/uL (ref 0.7–4.0)
MCHC: 33.8 g/dL (ref 30.0–36.0)
MCV: 96.2 fl (ref 78.0–100.0)
Monocytes Absolute: 0.6 K/uL (ref 0.1–1.0)
Monocytes Relative: 5.4 % (ref 3.0–12.0)
Neutro Abs: 10.3 K/uL — ABNORMAL HIGH (ref 1.4–7.7)
Neutrophils Relative %: 86.9 % — ABNORMAL HIGH (ref 43.0–77.0)
Platelets: 152 K/uL (ref 150.0–400.0)
RBC: 4.73 Mil/uL (ref 4.22–5.81)
RDW: 13.7 % (ref 11.5–15.5)
WBC: 11.9 K/uL — ABNORMAL HIGH (ref 4.0–10.5)

## 2024-09-22 LAB — COMPREHENSIVE METABOLIC PANEL WITH GFR
ALT: 19 U/L (ref 0–53)
AST: 37 U/L (ref 0–37)
Albumin: 4.1 g/dL (ref 3.5–5.2)
Alkaline Phosphatase: 48 U/L (ref 39–117)
BUN: 34 mg/dL — ABNORMAL HIGH (ref 6–23)
CO2: 26 meq/L (ref 19–32)
Calcium: 8.5 mg/dL (ref 8.4–10.5)
Chloride: 99 meq/L (ref 96–112)
Creatinine, Ser: 1.48 mg/dL (ref 0.40–1.50)
GFR: 48.29 mL/min — ABNORMAL LOW (ref >60.00)
Glucose, Bld: 102 mg/dL — ABNORMAL HIGH (ref 70–99)
Potassium: 3.8 meq/L (ref 3.5–5.1)
Sodium: 132 meq/L — ABNORMAL LOW (ref 135–145)
Total Bilirubin: 0.5 mg/dL (ref 0.2–1.2)
Total Protein: 7 g/dL (ref 6.0–8.3)

## 2024-09-22 MED ORDER — ONDANSETRON HCL 4 MG PO TABS
4.0000 mg | ORAL_TABLET | Freq: Three times a day (TID) | ORAL | 0 refills | Status: AC | PRN
  Filled 2024-09-22: qty 18, 6d supply, fill #0

## 2024-09-22 NOTE — Progress Notes (Signed)
 Virtual Visit via Video Note  I connected with Craig Morales on 09/22/24 at  9:00 AM EDT by a video enabled telemedicine application and verified that I am speaking with the correct person using two identifiers.  Location: Patient: [place] Provider: office [place]   I discussed the limitations of evaluation and management by telemedicine and the availability of in person appointments. The patient expressed understanding and agreed to proceed.  History of Present Illness: Discussed the use of AI scribe software for clinical note transcription with the patient, who gave verbal consent to proceed.  History of Present Illness         Craig Morales is a 68 year old male with irritable bowel syndrome who presents with severe diarrhea and fatigue.  He has been experiencing severe diarrhea since Wednesday morning at 2 AM, with a frequency of 15 to 20 times in a 24-hour period, severe enough to soil his bed sheets. He has been taking Imodium, one tablet every four hours, but it has not been effective.  Nausea but no vomiting. He feels very tired and lacks an appetite, forcing himself to eat chicken soup but unable to consume much. He has been trying to stay hydrated by drinking fluids, including seltzer water and Propel.  He recalls a similar episode years ago, attributed to a fungal infection, but cannot remember the medication that helped at that time. He has a history of irritable bowel syndrome and a past fungal infection diagnosed in the early 2000s.(also hiv and followed by ID)  He received a COVID shot on the Sunday before the symptoms began but did not experience any symptoms until Wednesday. He suspects the diarrhea might be related to chicken he ate from [employer] on Tuesday night, as he was not feeling tired or sick prior to the onset of diarrhea.  During the review of symptoms, he reports fever, feeling hot and sweating, and experiencing lightheadedness when standing up, especially when the  symptoms first started. No current vomiting but significant fatigue and weakness.  Pt scheduled virtual visit though ideally would have been in person     Observations/Objective:  General-no acute distress, pleasant, oriented. Lungs- on inspection lungs appear unlabored. Neck- no tracheal deviation or jvd on inspection. Neuro- gross motor function appears intact.   Assessment and Plan: Assessment and Plan          Patient Instructions  Acute diarrhea with dehydration risk Acute diarrhea with high frequency and dehydration risk. Possible viral or bacterial etiology, including food poisoning. Imodium ineffective. - Prescribed Zofran  for nausea and vomiting. immodium otc 2  tab every 6 hours prn diarrhea. - Instructed to hydrate with fluids like Propel or seltzer water. - Ordered stool studies. Turn in today or tomorrow morning. - Scheduled CBC and metabolic panel. - Advised bland diet, avoiding greasy and fried foods. - Instructed to update condition tomorrow via message to provider and Dr. Frann - Advised emergency care for IV hydration if symptoms worsen over the weekend.  (HIV and followed by ID) Potential immunosuppression with history of fungal gastrointestinal infection. .- Ordered CD4 count. - CMA will check blood pressure, pulse, temp and oxygen saturation during visit. -update on how doing with above by tomorrow afternoon.  Follow up date to be determined after lab review.     Follow Up Instructions:    I discussed the assessment and treatment plan with the patient. The patient was provided an opportunity to ask questions and all were answered. The patient agreed  with the plan and demonstrated an understanding of the instructions.   The patient was advised to call back or seek an in-person evaluation if the symptoms worsen or if the condition fails to improve as anticipated.   Lua Feng, PA-C

## 2024-09-22 NOTE — Patient Instructions (Signed)
 Acute diarrhea with dehydration risk Acute diarrhea with high frequency and dehydration risk. Possible viral or bacterial etiology, including food poisoning. Imodium ineffective. - Prescribed Zofran  for nausea and vomiting. immodium otc 2  tab every 6 hours prn diarrhea. - Instructed to hydrate with fluids like Propel or seltzer water. - Ordered stool studies. Turn in today or tomorrow morning. - Scheduled CBC and metabolic panel. - Advised bland diet, avoiding greasy and fried foods. - Instructed to update condition tomorrow via message to provider and Dr. Frann - Advised emergency care for IV hydration if symptoms worsen over the weekend.  (HIV and followed by ID) Potential immunosuppression with history of fungal gastrointestinal infection. .- Ordered CD4 count. - CMA will check blood pressure, pulse, temp and oxygen saturation during visit. -update on how doing with above by tomorrow afternoon.  Follow up date to be determined after lab review.

## 2024-09-22 NOTE — Addendum Note (Signed)
 Addended by: GERARD CHUCKIE SAILOR on: 09/22/2024 10:27 AM   Modules accepted: Orders

## 2024-09-22 NOTE — Telephone Encounter (Signed)
 FYI Only or Action Required?: Action required by provider: request for appointment.  Patient was last seen in primary care on 01/12/2024 by Jason Leita Repine, FNP.  Called Nurse Triage reporting Diarrhea.  Symptoms began several days ago.  Interventions attempted: Rest, hydration, or home remedies.  Symptoms are: unchanged.Diarrhea, fever  Triage Disposition: See Physician Within 24 Hours  Patient/caregiver understands and will follow disposition?: Yes    Copied from CRM #[id number]. Topic: Clinical - Red Word Triage >> Sep 22, 2024  8:13 AM Thersia BROCKS wrote: [facility] that prompted transfer to Nurse Triage: diarrhea and a fever and vomiting stomach pain , patient stated he had covid shot not sure if it was because of that Answer Assessment - Initial Assessment Questions 1. DIARRHEA SEVERITY: How bad is the diarrhea? How many more stools have you had in the past 24 hours than normal?      many 2. ONSET: When did the diarrhea begin?      Tuesday 3. STOOL DESCRIPTION:  How loose or watery is the diarrhea? What is the stool color? Is there any blood or mucous in the stool?     watery 4. VOMITING: Are you also vomiting? If Yes, ask: How many times in the past 24 hours?      Not now 5. ABDOMEN PAIN: Are you having any abdomen pain? If Yes, ask: What does it feel like? (e.g., crampy, dull, intermittent, constant)      none 6. ABDOMEN PAIN SEVERITY: If present, ask: How bad is the pain?  (e.g., Scale 1-10; mild, moderate, or severe)     0 7. ORAL INTAKE: If vomiting, Have you been able to drink liquids? How much liquids have you had in the past 24 hours?     yes 8. HYDRATION: Any signs of dehydration? (e.g., dry mouth [not just dry lips], too weak to stand, dizziness, new weight loss) When did you last urinate?     no 9. EXPOSURE: Have you traveled to a foreign country recently? Have you been exposed to anyone with diarrhea? Could you have eaten any food  that was spoiled?     no 10. ANTIBIOTIC USE: Are you taking antibiotics now or have you taken antibiotics in the past 2 months?       no 11. OTHER SYMPTOMS: Do you have any other symptoms? (e.g., fever, blood in stool)       no 12. PREGNANCY: Is there any chance you are pregnant? When was your last menstrual period?       N/a  Protocols used: Diarrhea-A-AH  Reason for Disposition  [1] SEVERE diarrhea (e.g., 7 or more times / day more than normal) AND [2] present > 24 hours (1 day)  Answer Assessment - Initial Assessment Questions 1. DIARRHEA SEVERITY: How bad is the diarrhea? How many more stools have you had in the past 24 hours than normal?      many 2. ONSET: When did the diarrhea begin?      Tuesday 3. STOOL DESCRIPTION:  How loose or watery is the diarrhea? What is the stool color? Is there any blood or mucous in the stool?     watery 4. VOMITING: Are you also vomiting? If Yes, ask: How many times in the past 24 hours?      Not now 5. ABDOMEN PAIN: Are you having any abdomen pain? If Yes, ask: What does it feel like? (e.g., crampy, dull, intermittent, constant)      none 6. ABDOMEN PAIN SEVERITY:  If present, ask: How bad is the pain?  (e.g., Scale 1-10; mild, moderate, or severe)     0 7. ORAL INTAKE: If vomiting, Have you been able to drink liquids? How much liquids have you had in the past 24 hours?     yes 8. HYDRATION: Any signs of dehydration? (e.g., dry mouth [not just dry lips], too weak to stand, dizziness, new weight loss) When did you last urinate?     no 9. EXPOSURE: Have you traveled to a foreign country recently? Have you been exposed to anyone with diarrhea? Could you have eaten any food that was spoiled?     no 10. ANTIBIOTIC USE: Are you taking antibiotics now or have you taken antibiotics in the past 2 months?       no 11. OTHER SYMPTOMS: Do you have any other symptoms? (e.g., fever, blood in stool)        no 12. PREGNANCY: Is there any chance you are pregnant? When was your last menstrual period?       N/a  Protocols used: [facility]

## 2024-09-22 NOTE — Addendum Note (Signed)
 Addended by: ESTELLE GILLIS D on: 09/22/2024 10:40 AM   Modules accepted: Orders

## 2024-09-22 NOTE — Addendum Note (Signed)
 Addended by: TRUDY CURVIN RAMAN on: 09/22/2024 10:35 AM   Modules accepted: Orders

## 2024-09-22 NOTE — Addendum Note (Signed)
 Addended by: GERARD CHUCKIE SAILOR on: 09/22/2024 10:34 AM   Modules accepted: Orders

## 2024-09-23 ENCOUNTER — Encounter: Payer: Self-pay | Admitting: Family Medicine

## 2024-09-23 LAB — T-HELPER CELLS (CD4) COUNT (NOT AT ARMC)
Absolute CD4: 327 {cells}/uL — ABNORMAL LOW (ref 490–1740)
CD4 T Helper %: 34 % (ref 30–61)
Total lymphocyte count: 970 {cells}/uL (ref 850–3900)

## 2024-09-24 LAB — CLOSTRIDIUM DIFFICILE BY PCR: Toxigenic C. Difficile by PCR: NEGATIVE

## 2024-09-26 ENCOUNTER — Other Ambulatory Visit: Payer: Self-pay

## 2024-09-26 ENCOUNTER — Emergency Department (HOSPITAL_BASED_OUTPATIENT_CLINIC_OR_DEPARTMENT_OTHER)
Admission: EM | Admit: 2024-09-26 | Discharge: 2024-09-26 | Disposition: A | Discharge status: Home / Self Care | Attending: Emergency Medicine | Admitting: Emergency Medicine

## 2024-09-26 ENCOUNTER — Encounter (HOSPITAL_BASED_OUTPATIENT_CLINIC_OR_DEPARTMENT_OTHER): Payer: Self-pay

## 2024-09-26 ENCOUNTER — Emergency Department (HOSPITAL_BASED_OUTPATIENT_CLINIC_OR_DEPARTMENT_OTHER)

## 2024-09-26 DIAGNOSIS — N3289 Other specified disorders of bladder: Secondary | ICD-10-CM | POA: Diagnosis not present

## 2024-09-26 DIAGNOSIS — K529 Noninfective gastroenteritis and colitis, unspecified: Secondary | ICD-10-CM | POA: Insufficient documentation

## 2024-09-26 DIAGNOSIS — Z21 Asymptomatic human immunodeficiency virus [HIV] infection status: Secondary | ICD-10-CM | POA: Diagnosis not present

## 2024-09-26 DIAGNOSIS — K449 Diaphragmatic hernia without obstruction or gangrene: Secondary | ICD-10-CM | POA: Diagnosis not present

## 2024-09-26 DIAGNOSIS — R197 Diarrhea, unspecified: Secondary | ICD-10-CM | POA: Diagnosis not present

## 2024-09-26 DIAGNOSIS — K5730 Diverticulosis of large intestine without perforation or abscess without bleeding: Secondary | ICD-10-CM | POA: Diagnosis not present

## 2024-09-26 HISTORY — DX: Rheumatoid arthritis, unspecified: M06.9

## 2024-09-26 LAB — CBC WITH DIFFERENTIAL/PLATELET
Abs Immature Granulocytes: 0.02 K/uL (ref 0.00–0.07)
Basophils Absolute: 0 K/uL (ref 0.0–0.1)
Basophils Relative: 0 %
Eosinophils Absolute: 0.1 K/uL (ref 0.0–0.5)
Eosinophils Relative: 1 %
HCT: 45.1 % (ref 39.0–52.0)
Hemoglobin: 15.9 g/dL (ref 13.0–17.0)
Immature Granulocytes: 0 %
Lymphocytes Relative: 24 %
Lymphs Abs: 2.4 K/uL (ref 0.7–4.0)
MCH: 32.9 pg (ref 26.0–34.0)
MCHC: 35.3 g/dL (ref 30.0–36.0)
MCV: 93.4 fL (ref 80.0–100.0)
Monocytes Absolute: 1.6 K/uL — ABNORMAL HIGH (ref 0.1–1.0)
Monocytes Relative: 16 %
Neutro Abs: 5.7 K/uL (ref 1.7–7.7)
Neutrophils Relative %: 59 %
Platelets: 199 K/uL (ref 150–400)
RBC: 4.83 MIL/uL (ref 4.22–5.81)
RDW: 13.7 % (ref 11.5–15.5)
Smear Review: NORMAL
WBC: 9.8 K/uL (ref 4.0–10.5)
nRBC: 0 % (ref 0.0–0.2)

## 2024-09-26 LAB — STOOL CULTURE: E coli, Shiga toxin Assay: NEGATIVE

## 2024-09-26 LAB — LIPASE, BLOOD: Lipase: 86 U/L — ABNORMAL HIGH (ref 11–51)

## 2024-09-26 LAB — COMPREHENSIVE METABOLIC PANEL WITH GFR
ALT: 23 U/L (ref 0–44)
AST: 32 U/L (ref 15–41)
Albumin: 3.8 g/dL (ref 3.5–5.0)
Alkaline Phosphatase: 66 U/L (ref 38–126)
Anion gap: 12 (ref 5–15)
BUN: 11 mg/dL (ref 8–23)
CO2: 23 mmol/L (ref 22–32)
Calcium: 8.8 mg/dL — ABNORMAL LOW (ref 8.9–10.3)
Chloride: 100 mmol/L (ref 98–111)
Creatinine, Ser: 1.02 mg/dL (ref 0.61–1.24)
GFR, Estimated: >60 mL/min (ref >60)
Glucose, Bld: 94 mg/dL (ref 70–99)
Potassium: 3.9 mmol/L (ref 3.5–5.1)
Sodium: 135 mmol/L (ref 135–145)
Total Bilirubin: 0.5 mg/dL (ref 0.0–1.2)
Total Protein: 7 g/dL (ref 6.5–8.1)

## 2024-09-26 MED ORDER — METRONIDAZOLE 500 MG PO TABS: 500.0000 mg | ORAL_TABLET | Freq: Two times a day (BID) | ORAL | 0 refills | Status: AC

## 2024-09-26 MED ORDER — METRONIDAZOLE 500 MG PO TABS: 500.0000 mg | ORAL_TABLET | Freq: Two times a day (BID) | ORAL | 0 refills | Status: DC

## 2024-09-26 MED ORDER — IOHEXOL 300 MG/ML  SOLN
100.0000 mL | Freq: Once | INTRAMUSCULAR | Status: AC | PRN
  Administered 2024-09-26: 100 mL via INTRAVENOUS

## 2024-09-26 MED ORDER — CIPROFLOXACIN HCL 500 MG PO TABS: 500.0000 mg | ORAL_TABLET | Freq: Two times a day (BID) | ORAL | 0 refills | Status: AC

## 2024-09-26 MED ORDER — LACTATED RINGERS IV BOLUS
1000.0000 mL | Freq: Once | INTRAVENOUS | Status: AC
  Administered 2024-09-26: 1000 mL via INTRAVENOUS

## 2024-09-26 MED ORDER — CIPROFLOXACIN HCL 500 MG PO TABS: 500.0000 mg | ORAL_TABLET | Freq: Two times a day (BID) | ORAL | 0 refills | Status: DC

## 2024-09-26 NOTE — ED Provider Notes (Signed)
 [place] [facility] Provider Note   CSN: [id number] Arrival date & time: 09/26/24  9160     Patient presents with: Diarrhea   Craig Morales is a 68 y.o. male.    Diarrhea    Patient presents ED for evaluation of persistent diarrhea.  Patient does have history of HIV acid reflux rheumatoid arthritis.  Patient states he started having vomiting as well as diarrhea last Wednesday.  The vomiting resolved after the first day but he has continued to have diarrhea.  Patient states he is having some cramping but no severe pain.  He has also felt feverish.  He saw his [facility] care doctor on the 16th.  He had laboratory testing.  Patient states he felt like he was starting to get better but then the symptoms exacerbated again yesterday.  He is having almost 20 episodes of watery stool per day.  No relief with Imodium.  Patient feels dehydrated and drained.  Prior to Admission medications   Medication Sig Start Date End Date Taking? Authorizing Provider  Ascorbic Acid (VITAMIN C WITH ROSE HIPS) 500 MG tablet Take 500 mg by mouth daily.    [provider]  bictegravir-emtricitabine-tenofovir  AF (BIKTARVY ) 50-200-25 MG TABS tablet Take 1 tablet by mouth daily. 01/14/24   Efrain Lamar ORN, MD  ciprofloxacin  (CIPRO ) 500 MG tablet Take 1 tablet (500 mg total) by mouth every 12 (twelve) hours for 7 days. 09/26/24 10/03/24  Randol Simmonds, MD  Digestive Enzymes (DIGESTIVE ENZYME PO) Take by mouth.    [provider]  fluticasone  (FLONASE ) 50 MCG/ACT nasal spray USE TWO SPRAY(S) IN EACH NOSTRIL ONCE DAILY 03/25/15   Gladis Elsie BROCKS, PA-C  folic acid  (FOLVITE ) 1 MG tablet Take 1 mg by mouth daily.    [provider]  Iron  Glycinate 29 MG CAPS Take 1 capsule by mouth daily. 03/28/24   Wendling, Mabel Mt, DO  KRILL OIL OMEGA-3 PO Take 1 each by mouth daily.    [provider]  Melatonin 5 MG CAPS Take 1 each by mouth at bedtime.     [provider]  methotrexate  2.5 MG tablet Take 20 mg by mouth once a week.    [provider]  metroNIDAZOLE (FLAGYL) 500 MG tablet Take 1 tablet (500 mg total) by mouth 2 (two) times daily. 09/26/24   Randol Simmonds, MD  Misc Natural Products (OSTEO BI-FLEX ADV JOINT SHIELD) TABS Take 1 each by mouth daily.    [provider]  Multiple Vitamins-Minerals (CENTRUM SILVER ADULT 50+ PO) Take 1 each by mouth daily.    [provider]  Multiple Vitamins-Minerals (PRESERVISION AREDS PO)  07/26/21   [provider]  neomycin -polymyxin-hydrocortisone (CORTISPORIN) 3.5-10000-1 ophthalmic suspension Place 3 drops into both eyes 3 (three) times daily. 12/16/23   O'Sullivan, Melissa, NP  ondansetron  (ZOFRAN ) 4 MG tablet Take 1 tablet (4 mg total) by mouth every 8 (eight) hours as needed for nausea or vomiting. 09/22/24   Saguier, [place], PA-C  ondansetron  (ZOFRAN -ODT) 8 MG disintegrating tablet Take 1 tablet (8 mg total) by mouth every 8 (eight) hours as needed for nausea or vomiting. 01/12/24   Jason Leita Repine, FNP  pantoprazole  (PROTONIX ) 40 MG tablet Take 40 mg by mouth daily. 02/17/17   [provider]  pregabalin  (LYRICA ) 100 MG capsule Take 1 tablet by mouth daily. 07/08/17   [provider]  pregabalin  (LYRICA ) 25 MG capsule take one capsule po q am 05/03/19   [provider]  Probiotic Product (PROBIOTIC-10 PO) Take by mouth.    [provider]  Propylene Glycol (SYSTANE COMPLETE OP)  12/16/21   [provider]  sildenafil  (VIAGRA ) 100 MG tablet Take 0.5-1 tablets (50-100 mg total) by mouth daily as needed for erectile dysfunction. 10/16/23   Frann Mabel Mt, DO  valACYclovir  (VALTREX ) 500 MG tablet Take 1 tablet (500 mg total) by mouth 2 (two) times daily. Take 1 tablet (500 mg total) by mouth 2 (two) times daily as needed. 10/02/21   Elaine Rush, MD    Allergies: Oxycontin  [oxycodone  hcl], Hydrocodone, and  Iodine    Review of Systems  Gastrointestinal:  Positive for diarrhea.    Updated Vital Signs BP 112/65   Pulse 67   Temp 98.1 F (36.7 C) (Oral)   Resp 18   Ht 1.778 m (5' 10)   Wt 72.6 kg   SpO2 97%   BMI 22.96 kg/m   Physical Exam Vitals and nursing note reviewed.  Constitutional:      General: He is not in acute distress.    Appearance: He is well-developed.  HENT:     Head: Normocephalic and atraumatic.     Right Ear: External ear normal.     Left Ear: External ear normal.  Eyes:     General: No scleral icterus.       Right eye: No discharge.        Left eye: No discharge.     Conjunctiva/sclera: Conjunctivae normal.  Neck:     Trachea: No tracheal deviation.  Cardiovascular:     Rate and Rhythm: Normal rate and regular rhythm.  Pulmonary:     Effort: Pulmonary effort is normal. No respiratory distress.     Breath sounds: Normal breath sounds. No stridor. No wheezing or rales.  Abdominal:     General: Bowel sounds are normal. There is no distension.     Palpations: Abdomen is soft.     Tenderness: There is no abdominal tenderness. There is no guarding or rebound.  Musculoskeletal:        General: No tenderness or deformity.     Cervical back: Neck supple.  Skin:    General: Skin is warm and dry.     Findings: No rash.  Neurological:     General: No focal deficit present.     Mental Status: He is alert.     Cranial Nerves: No cranial nerve deficit, dysarthria or facial asymmetry.     Sensory: No sensory deficit.     Motor: No abnormal muscle tone or seizure activity.     Coordination: Coordination normal.  Psychiatric:        Mood and Affect: Mood normal.     (all labs ordered are listed, but only abnormal results are displayed) Labs Reviewed  COMPREHENSIVE METABOLIC PANEL WITH GFR - Abnormal; Notable for the following components:      Result Value   Calcium 8.8 (*)    All other components within normal limits  LIPASE, BLOOD - Abnormal; Notable  for the following components:   Lipase 86 (*)    All other components within normal limits  CBC WITH DIFFERENTIAL/PLATELET - Abnormal; Notable for the following components:   Monocytes Absolute 1.6 (*)    All other components within normal limits    EKG: None  Radiology: CT ABDOMEN PELVIS W CONTRAST Result Date: 09/26/2024 CLINICAL DATA:  Diverticulitis, complication suspected EXAM: CT ABDOMEN AND PELVIS WITH CONTRAST TECHNIQUE: Multidetector CT imaging of  the abdomen and pelvis was performed using the standard protocol following bolus administration of intravenous contrast. RADIATION DOSE REDUCTION: This exam was performed according to the departmental dose-optimization program which includes automated exposure control, adjustment of the mA and/or kV according to patient size and/or use of iterative reconstruction technique. CONTRAST:  100mL OMNIPAQUE IOHEXOL 300 MG/ML  SOLN COMPARISON:  None available. FINDINGS: Lower chest: No focal airspace consolidation or pleural effusion. Hepatobiliary: No mass.Radiopaque stones. Moderate pericholecystic edema surrounding the gallbladder without wall thickening. No intrahepatic or extrahepatic biliary ductal dilation. The portal veins are patent. Pancreas: No mass or main ductal dilation. No peripancreatic inflammation or fluid collection. Spleen: Normal size. No mass. Adrenals/Urinary Tract: No adrenal masses. Subcentimeter hypodensities are noted in the kidneys, too small to definitively characterize, but likely small cysts. 2 cm left interpolar region cyst. No nephrolithiasis or hydronephrosis. The urinary bladder is distended without focal abnormality. Stomach/Bowel: Small hiatal hernia. The stomach is decompressed without focal abnormality. No small bowel wall thickening or inflammation. No small bowel obstruction.Decompressed, normal appendix. Mucosal hyperenhancement throughout the entire colon with total colonic inflammation. Wall thickening is most  pronounced within the descending and sigmoid colon. A few scattered descending colonic diverticula are present. Vascular/Lymphatic: No aortic aneurysm. Scattered aortoiliac atherosclerosis. No intraabdominal or pelvic lymphadenopathy. Reproductive: Mild prostatomegaly.Trace free fluid in the pelvis. Other: No pneumoperitoneum or ascites. Musculoskeletal: No acute fracture or destructive lesion. Diffuse osteopenia. Multilevel degenerative disc disease of the spine. Moderate bilateral hip osteoarthritis. IMPRESSION: 1. Findings consistent with an infectious or inflammatory colitis. 2. Multiple small radiopaque gallstones. Moderate pericholecystic edema present surrounding the gallbladder without wall thickening. While this could represent changes of early acute cholecystitis, alternative considerations include acute hepatitis, hypoproteinemia, and volume overload from either CHF or renal failure. Laboratory correlation and a nonemergent nuclear medicine hepatobiliary scan are recommended further characterization. Aortic Atherosclerosis (ICD10-I70.0). Electronically Signed   By: Rogelia Myers M.D.   On: 09/26/2024 11:22     Procedures   Medications Ordered in the ED  lactated ringers bolus 1,000 mL (0 mLs Intravenous Stopped 09/26/24 1024)  iohexol (OMNIPAQUE) 300 MG/ML solution 100 mL (100 mLs Intravenous Contrast Given 09/26/24 0958)    Clinical Course as of 09/26/24 1206  Mon Sep 26, 2024  0900 Stool culture reports from primary care doctor office reviewed.  C. difficile was negative.  Stool culture negative for Salmonella or Shigella.  Shiga toxin is negative. [JK]  0930 CBC with Diff Nl  [JK]  0936 CBC with Diff CBC normal.  Metabolic panel normal [JK]  0937 Lipase, blood(!) Lipase mildly elevated.  Doubt clinically significant [JK]  1140 CT scan shows findings consistent with infectious or inflammatory colitis.  Patient also has gallstones with some pericholecystic edema without evidence of  wall thickening.  Low suspicion for cholecystitis with his symptoms [JK]    Clinical Course User Index [JK] Randol Simmonds, MD                                 Medical Decision Making Problems Addressed: Colitis: acute illness or injury that poses a threat to life or bodily functions  Amount and/or Complexity of Data Reviewed Labs: ordered. Decision-making details documented in ED Course. Radiology: ordered and independent interpretation performed.  Risk Prescription drug management.   Patient presented to ED for evaluation of persistent diarrhea.  Patient is also complaining of abdominal discomfort.  His outpatient stool studies were negative for C.  difficile Shigella or Salmonella.  Patient continued to have persistent diarrhea.  I was concerned about the possibility of dehydration, colitis, diverticulitis.   CT scan does show evidence of colitis.  There is also incidental finding of gallstones with some possible gallbladder wall thickening.  Patient is not having any pain in his right upper quadrant.  I do not think this is related to his symptoms.  I have low suspicion for acute cholecystitis  Will start the patient on a course of antibiotics.  He is not having severe pain and does not have any vomiting.  I do not think he requires hospitalization.  Discussed outpatient follow-up with his GI or PCP to be rechecked.     Final diagnoses:  Colitis    ED Discharge Orders          Ordered    ciprofloxacin  (CIPRO ) 500 MG tablet  Every 12 hours,   Status:  Discontinued        09/26/24 1149    metroNIDAZOLE (FLAGYL) 500 MG tablet  2 times daily,   Status:  Discontinued        09/26/24 1149    ciprofloxacin  (CIPRO ) 500 MG tablet  Every 12 hours        09/26/24 1155    metroNIDAZOLE (FLAGYL) 500 MG tablet  2 times daily        09/26/24 1155               Randol Simmonds, MD 09/26/24 1206

## 2024-09-26 NOTE — ED Triage Notes (Signed)
 C/o diarrhea x 5 days >10x/day. Had vomiting the first day. Abdominal cramping, taking zofran  & imodium.

## 2024-09-26 NOTE — Discharge Instructions (Signed)
 The CT scan showed findings consistent with colitis.  This would explain the persistent diarrhea that you are having.  I suspect this is an infectious colitis and will start on a course of antibiotics.  There are incidental gallstones but I do not think this is related to any of your symptoms.  Follow-up with your primary care doctor or GI doctor to make sure your symptoms are improving.  Return for fevers vomiting or other concerning symptoms

## 2024-09-28 LAB — OVA AND PARASITE EXAMINATION
CONCENTRATE RESULT:: NONE SEEN
MICRO NUMBER:: 17108680
SPECIMEN QUALITY:: ADEQUATE
TRICHROME RESULT:: NONE SEEN

## 2024-10-07 DIAGNOSIS — M069 Rheumatoid arthritis, unspecified: Secondary | ICD-10-CM | POA: Diagnosis not present

## 2024-10-21 ENCOUNTER — Encounter: Payer: Self-pay | Admitting: Family Medicine

## 2024-10-21 ENCOUNTER — Ambulatory Visit: Payer: Self-pay | Admitting: Family Medicine

## 2024-10-21 ENCOUNTER — Ambulatory Visit (INDEPENDENT_AMBULATORY_CARE_PROVIDER_SITE_OTHER): Payer: BC Managed Care – PPO | Admitting: Family Medicine

## 2024-10-21 VITALS — BP 118/76 | HR 65 | Temp 98.0°F | Resp 16 | Ht 70.0 in | Wt 152.4 lb

## 2024-10-21 DIAGNOSIS — Z0000 Encounter for general adult medical examination without abnormal findings: Secondary | ICD-10-CM

## 2024-10-21 DIAGNOSIS — Z125 Encounter for screening for malignant neoplasm of prostate: Secondary | ICD-10-CM | POA: Diagnosis not present

## 2024-10-21 LAB — CBC
HCT: 47.4 % (ref 39.0–52.0)
Hemoglobin: 16 g/dL (ref 13.0–17.0)
MCHC: 33.8 g/dL (ref 30.0–36.0)
MCV: 95.7 fl (ref 78.0–100.0)
Platelets: 239 K/uL (ref 150.0–400.0)
RBC: 4.95 Mil/uL (ref 4.22–5.81)
RDW: 14.6 % (ref 11.5–15.5)
WBC: 8.4 K/uL (ref 4.0–10.5)

## 2024-10-21 LAB — COMPREHENSIVE METABOLIC PANEL WITH GFR
ALT: 22 U/L (ref 0–53)
AST: 28 U/L (ref 0–37)
Albumin: 4.2 g/dL (ref 3.5–5.2)
Alkaline Phosphatase: 62 U/L (ref 39–117)
BUN: 27 mg/dL — ABNORMAL HIGH (ref 6–23)
CO2: 31 meq/L (ref 19–32)
Calcium: 9.4 mg/dL (ref 8.4–10.5)
Chloride: 101 meq/L (ref 96–112)
Creatinine, Ser: 0.94 mg/dL (ref 0.40–1.50)
GFR: 83.21 mL/min (ref >60.00)
Glucose, Bld: 66 mg/dL — ABNORMAL LOW (ref 70–99)
Potassium: 4.3 meq/L (ref 3.5–5.1)
Sodium: 138 meq/L (ref 135–145)
Total Bilirubin: 0.8 mg/dL (ref 0.2–1.2)
Total Protein: 7.4 g/dL (ref 6.0–8.3)

## 2024-10-21 LAB — PSA: PSA: 4.38 ng/mL — ABNORMAL HIGH (ref 0.10–4.00)

## 2024-10-21 LAB — LIPID PANEL
Cholesterol: 184 mg/dL (ref 0–200)
HDL: 58 mg/dL (ref >39.00)
LDL Cholesterol: 112 mg/dL — ABNORMAL HIGH (ref 0–99)
NonHDL: 125.57
Total CHOL/HDL Ratio: 3
Triglycerides: 67 mg/dL (ref 0.0–149.0)
VLDL: 13.4 mg/dL (ref 0.0–40.0)

## 2024-10-21 NOTE — Progress Notes (Signed)
 Chief Complaint  Patient presents with   Annual Exam    CPE    Well Male Craig Morales is here for a complete physical.   His last physical was >1 year ago.  Current diet: in general, a healthy diet.   Current exercise: stretching, core work, runner, broadcasting/film/video Weight trend: had lost wt due to a recent GI illness Fatigue out of ordinary? No. Seat belt? Yes.   Advanced directive? No  Health maintenance Shingrix- Yes Colonoscopy- Yes Tetanus- Yes Hep C- Yes Pneumonia vaccine- Yes  Past Medical History:  Diagnosis Date   GERD (gastroesophageal reflux disease)    HIV (human immunodeficiency virus infection) (HCC)    Rheumatoid arthritis (HCC)    Seasonal allergies      Past Surgical History:  Procedure Laterality Date   KNEE SURGERY     NASAL SEPTUM SURGERY      Medications  Current Outpatient Medications on File Prior to Visit  Medication Sig Dispense Refill   Ascorbic Acid (VITAMIN C WITH ROSE HIPS) 500 MG tablet Take 500 mg by mouth daily.     bictegravir-emtricitabine-tenofovir  AF (BIKTARVY ) 50-200-25 MG TABS tablet Take 1 tablet by mouth daily. 90 tablet 3   Digestive Enzymes (DIGESTIVE ENZYME PO) Take by mouth.     fluticasone  (FLONASE ) 50 MCG/ACT nasal spray USE TWO SPRAY(S) IN EACH NOSTRIL ONCE DAILY 16 g 6   folic acid  (FOLVITE ) 1 MG tablet Take 1 mg by mouth daily.     Iron  Glycinate 29 MG CAPS Take 1 capsule by mouth daily. 30 capsule 1   KRILL OIL OMEGA-3 PO Take 1 each by mouth daily.     Melatonin 5 MG CAPS Take 1 each by mouth at bedtime.     methotrexate  2.5 MG tablet Take 20 mg by mouth once a week.     metroNIDAZOLE (FLAGYL) 500 MG tablet Take 1 tablet (500 mg total) by mouth 2 (two) times daily. 14 tablet 0   Misc Natural Products (OSTEO BI-FLEX ADV JOINT SHIELD) TABS Take 1 each by mouth daily.     Multiple Vitamins-Minerals (CENTRUM SILVER ADULT 50+ PO) Take 1 each by mouth daily.     Multiple Vitamins-Minerals (PRESERVISION AREDS PO)       neomycin -polymyxin-hydrocortisone (CORTISPORIN) 3.5-10000-1 ophthalmic suspension Place 3 drops into both eyes 3 (three) times daily. 7.5 mL 0   ondansetron  (ZOFRAN ) 4 MG tablet Take 1 tablet (4 mg total) by mouth every 8 (eight) hours as needed for nausea or vomiting. 20 tablet 0   ondansetron  (ZOFRAN -ODT) 8 MG disintegrating tablet Take 1 tablet (8 mg total) by mouth every 8 (eight) hours as needed for nausea or vomiting. 20 tablet 0   pantoprazole  (PROTONIX ) 40 MG tablet Take 40 mg by mouth daily.     pregabalin  (LYRICA ) 100 MG capsule Take 1 tablet by mouth daily.     pregabalin  (LYRICA ) 25 MG capsule take one capsule po q am     Probiotic Product (PROBIOTIC-10 PO) Take by mouth.     Propylene Glycol (SYSTANE COMPLETE OP)      sildenafil  (VIAGRA ) 100 MG tablet Take 0.5-1 tablets (50-100 mg total) by mouth daily as needed for erectile dysfunction. 30 tablet 1   valACYclovir  (VALTREX ) 500 MG tablet Take 1 tablet (500 mg total) by mouth 2 (two) times daily. Take 1 tablet (500 mg total) by mouth 2 (two) times daily as needed. 90 tablet 3    Allergies Allergies  Allergen Reactions   Oxycontin  [Oxycodone  Hcl]  Shortness Of Breath and Palpitations   Hydrocodone     REACTION: SOB and chest pain   Iodine Diarrhea and Nausea And Vomiting    In high doses. Patient does fine with CT contrast:  09/26/24 JWM    Family History Family History  Problem Relation Age of Onset   Bleeding Disorder Neg Hx     Review of Systems: Constitutional:  no fevers Eye:  no recent significant change in vision Ears:  No changes in hearing Nose/Mouth/Throat:  no complaints of nasal congestion, no sore throat Cardiovascular: no chest pain Respiratory:  No shortness of breath Gastrointestinal:  No change in bowel habits GU:  No frequency Integumentary:  no abnormal skin lesions reported Neurologic:  no headaches Endocrine:  denies unexplained weight changes  Exam BP 118/76 (BP Location: Left Arm, Patient  Position: Sitting)   Pulse 65   Temp 98 F (36.7 C) (Oral)   Resp 16   Ht 5' 10 (1.778 m)   Wt 152 lb 6.4 oz (69.1 kg)   SpO2 95%   BMI 21.87 kg/m  General:  well developed, well nourished, in no apparent distress Skin:  no significant moles, warts, or growths Head:  no masses, lesions, or tenderness Eyes:  pupils equal and round, sclera anicteric without injection Ears:  canals without lesions, TMs shiny without retraction, no obvious effusion, no erythema Nose:  nares patent, mucosa normal Throat/Pharynx:  lips and gingiva without lesion; tongue and uvula midline; non-inflamed pharynx; no exudates or postnasal drainage Lungs:  clear to auscultation, breath sounds equal bilaterally, no respiratory distress Cardio:  regular rate and rhythm, no LE edema or bruits Rectal: Deferred GI: BS+, S, NT, ND, no masses or organomegaly Musculoskeletal:  symmetrical muscle groups noted without atrophy or deformity Neuro:  gait normal; deep tendon reflexes normal and symmetric Psych: well oriented with normal range of affect and appropriate judgment/insight  Assessment and Plan  Well adult exam - Plan: Comprehensive metabolic panel with GFR, CBC, Lipid panel  Screening for prostate cancer - Plan: PSA   Well 68 y.o. male. Counseled on diet and exercise. Advanced directive form provided today.  Other orders as above. Follow up in 1 yr.  The patient voiced understanding and agreement to the plan.  Mabel Mt [place], DO 10/21/24 9:19 AM

## 2024-10-21 NOTE — Patient Instructions (Signed)
 Give [place] 2-3 business days to get the results of your labs back.   Keep the diet clean and stay active.  Please get me a copy of your advanced directive form at your convenience.   Let us know if you need anything.

## 2024-10-27 DIAGNOSIS — H35363 Drusen (degenerative) of macula, bilateral: Secondary | ICD-10-CM | POA: Diagnosis not present

## 2024-10-27 DIAGNOSIS — H04123 Dry eye syndrome of bilateral lacrimal glands: Secondary | ICD-10-CM | POA: Diagnosis not present

## 2024-10-27 DIAGNOSIS — H25813 Combined forms of age-related cataract, bilateral: Secondary | ICD-10-CM | POA: Diagnosis not present

## 2024-11-08 ENCOUNTER — Other Ambulatory Visit: Payer: Self-pay

## 2024-11-08 DIAGNOSIS — B20 Human immunodeficiency virus [HIV] disease: Secondary | ICD-10-CM

## 2024-11-11 ENCOUNTER — Other Ambulatory Visit: Payer: BC Managed Care – PPO

## 2024-11-14 ENCOUNTER — Other Ambulatory Visit

## 2024-11-14 ENCOUNTER — Other Ambulatory Visit: Payer: Self-pay

## 2024-11-14 DIAGNOSIS — B20 Human immunodeficiency virus [HIV] disease: Secondary | ICD-10-CM | POA: Diagnosis not present

## 2024-11-17 LAB — T-HELPER CELLS (CD4) COUNT (NOT AT ARMC)
Absolute CD4: 1020 {cells}/uL (ref 490–1740)
CD4 T Helper %: 31 % (ref 30–61)
Total lymphocyte count: 3268 {cells}/uL (ref 850–3900)

## 2024-11-17 LAB — HIV-1 RNA QUANT-NO REFLEX-BLD
HIV 1 RNA Quant: NOT DETECTED {copies}/mL
HIV-1 RNA Quant, Log: NOT DETECTED {Log_copies}/mL

## 2024-11-21 NOTE — Progress Notes (Unsigned)
 Subjective:  Chief complaint: follow-up for HIV disease on medications   Patient ID: Craig Morales, male    DOB: 03/19/1956, 68 y.o.   MRN: [id number]  HPI  Past Medical History:  Diagnosis Date   GERD (gastroesophageal reflux disease)    HIV (human immunodeficiency virus infection) (HCC)    Rheumatoid arthritis (HCC)    Seasonal allergies     Past Surgical History:  Procedure Laterality Date   KNEE SURGERY     NASAL SEPTUM SURGERY      Family History  Problem Relation Age of Onset   Bleeding Disorder Neg Hx       Social History   Socioeconomic History   Marital status: Married    Spouse name: Not on file   Number of children: Not on file   Years of education: Not on file   Highest education level: Not on file  Occupational History   Not on file  Tobacco Use   Smoking status: Never   Smokeless tobacco: Never  Vaping Use   Vaping status: Never Used  Substance and Sexual Activity   Alcohol use: Not Currently    Comment: occasional   Drug use: No   Sexual activity: Not Currently    Birth control/protection: None    Comment: given condoms  Other Topics Concern   Not on file  Social History Narrative   Not on file   Social Drivers of Health   Tobacco Use: Low Risk (10/21/2024)   Patient History    Smoking Tobacco Use: Never    Smokeless Tobacco Use: Never    Passive Exposure: Not on file  Financial Resource Strain: Not on file  Food Insecurity: Low Risk (08/16/2024)   Received from [facility]   Epic    Within the past 12 months, you worried that your food would run out before you got money to buy more: Never true    Within the past 12 months, the food you bought just didn't last and you didn't have money to get more. : Never true  Transportation Needs: No Transportation Needs (08/16/2024)   Received from [facility]    In the past 12 months, has lack of reliable transportation kept you from medical appointments, meetings, work or  from getting things needed for daily living? : No  Physical Activity: Not on file  Stress: Not on file  Social Connections: Not on file  Depression (PHQ2-9): Low Risk (10/21/2024)   Depression (PHQ2-9)    PHQ-2 Score: 0  Alcohol Screen: Not on file  Housing: Low Risk (08/16/2024)   Received from [facility]   Epic    What is your living situation today?: I have a steady place to live    Think about the place you live. Do you have problems with any of the following? Choose all that apply:: None/None on this list  Utilities: Low Risk (08/16/2024)   Received from [facility]   Utilities    In the past 12 months has the electric, gas, oil, or water company threatened to shut off services in your home? : No  Health Literacy: Not on file    Allergies[1]  Current Medications[2]   Review of Systems     Objective:   Physical Exam        Assessment & Plan:       [1]  Allergies Allergen Reactions   Oxycontin  [Oxycodone  Hcl] Shortness Of Breath and Palpitations   Hydrocodone  REACTION: SOB and chest pain   Iodine Diarrhea and Nausea And Vomiting    In high doses. Patient does fine with CT contrast:  09/26/24 JWM  [2]  Current Outpatient Medications:    Ascorbic Acid (VITAMIN C WITH ROSE HIPS) 500 MG tablet, Take 500 mg by mouth daily., Disp: , Rfl:    bictegravir-emtricitabine-tenofovir  AF (BIKTARVY ) 50-200-25 MG TABS tablet, Take 1 tablet by mouth daily., Disp: 90 tablet, Rfl: 3   Digestive Enzymes (DIGESTIVE ENZYME PO), Take by mouth., Disp: , Rfl:    fluticasone  (FLONASE ) 50 MCG/ACT nasal spray, USE TWO SPRAY(S) IN EACH NOSTRIL ONCE DAILY, Disp: 16 g, Rfl: 6   folic acid (FOLVITE) 1 MG tablet, Take 1 mg by mouth daily., Disp: , Rfl:    Iron  Glycinate 29 MG CAPS, Take 1 capsule by mouth daily., Disp: 30 capsule, Rfl: 1   KRILL OIL OMEGA-3 PO, Take 1 each by mouth daily., Disp: , Rfl:    Melatonin 5 MG CAPS, Take 1 each by mouth at bedtime., Disp: , Rfl:     methotrexate 2.5 MG tablet, Take 20 mg by mouth once a week., Disp: , Rfl:    metroNIDAZOLE  (FLAGYL ) 500 MG tablet, Take 1 tablet (500 mg total) by mouth 2 (two) times daily., Disp: 14 tablet, Rfl: 0   Misc Natural Products (OSTEO BI-FLEX ADV JOINT SHIELD) TABS, Take 1 each by mouth daily., Disp: , Rfl:    Multiple Vitamins-Minerals (CENTRUM SILVER ADULT 50+ PO), Take 1 each by mouth daily., Disp: , Rfl:    Multiple Vitamins-Minerals (PRESERVISION AREDS PO), , Disp: , Rfl:    neomycin -polymyxin-hydrocortisone (CORTISPORIN) 3.5-10000-1 ophthalmic suspension, Place 3 drops into both eyes 3 (three) times daily., Disp: 7.5 mL, Rfl: 0   ondansetron  (ZOFRAN ) 4 MG tablet, Take 1 tablet (4 mg total) by mouth every 8 (eight) hours as needed for nausea or vomiting., Disp: 20 tablet, Rfl: 0   ondansetron  (ZOFRAN -ODT) 8 MG disintegrating tablet, Take 1 tablet (8 mg total) by mouth every 8 (eight) hours as needed for nausea or vomiting., Disp: 20 tablet, Rfl: 0   pantoprazole (PROTONIX) 40 MG tablet, Take 40 mg by mouth daily., Disp: , Rfl:    pregabalin (LYRICA) 100 MG capsule, Take 1 tablet by mouth daily., Disp: , Rfl:    pregabalin (LYRICA) 25 MG capsule, take one capsule po q am, Disp: , Rfl:    Probiotic Product (PROBIOTIC-10 PO), Take by mouth., Disp: , Rfl:    Propylene Glycol (SYSTANE COMPLETE OP), , Disp: , Rfl:    sildenafil  (VIAGRA ) 100 MG tablet, Take 0.5-1 tablets (50-100 mg total) by mouth daily as needed for erectile dysfunction., Disp: 30 tablet, Rfl: 1   valACYclovir  (VALTREX ) 500 MG tablet, Take 1 tablet (500 mg total) by mouth 2 (two) times daily. Take 1 tablet (500 mg total) by mouth 2 (two) times daily as needed., Disp: 90 tablet, Rfl: 3

## 2024-11-22 ENCOUNTER — Other Ambulatory Visit: Payer: Self-pay

## 2024-11-22 ENCOUNTER — Encounter: Payer: Self-pay | Admitting: Infectious Disease

## 2024-11-22 ENCOUNTER — Ambulatory Visit: Admitting: Infectious Disease

## 2024-11-22 VITALS — BP 124/76 | HR 61 | Temp 97.1°F | Ht 70.0 in | Wt 158.0 lb

## 2024-11-22 DIAGNOSIS — E785 Hyperlipidemia, unspecified: Secondary | ICD-10-CM | POA: Diagnosis not present

## 2024-11-22 DIAGNOSIS — Z113 Encounter for screening for infections with a predominantly sexual mode of transmission: Secondary | ICD-10-CM | POA: Diagnosis not present

## 2024-11-22 DIAGNOSIS — B20 Human immunodeficiency virus [HIV] disease: Secondary | ICD-10-CM | POA: Diagnosis not present

## 2024-11-22 DIAGNOSIS — M069 Rheumatoid arthritis, unspecified: Secondary | ICD-10-CM

## 2024-11-22 DIAGNOSIS — R972 Elevated prostate specific antigen [PSA]: Secondary | ICD-10-CM

## 2024-11-22 MED ORDER — ATORVASTATIN CALCIUM 20 MG PO TABS: 20.0000 mg | ORAL_TABLET | Freq: Every day | ORAL | 11 refills | Status: AC

## 2024-11-22 MED ORDER — BIKTARVY 50-200-25 MG PO TABS: 1.0000 | ORAL_TABLET | Freq: Every day | ORAL | 3 refills | Status: AC

## 2024-11-23 ENCOUNTER — Encounter: Payer: Self-pay | Admitting: Infectious Disease

## 2024-11-25 ENCOUNTER — Ambulatory Visit: Payer: Self-pay | Admitting: Internal Medicine

## 2024-12-30 ENCOUNTER — Encounter: Payer: Self-pay | Admitting: Infectious Disease

## 2025-01-02 ENCOUNTER — Other Ambulatory Visit: Payer: Self-pay

## 2025-01-02 DIAGNOSIS — B009 Herpesviral infection, unspecified: Secondary | ICD-10-CM

## 2025-01-02 MED ORDER — VALACYCLOVIR HCL 500 MG PO TABS: 500.0000 mg | ORAL_TABLET | Freq: Two times a day (BID) | ORAL | 3 refills | Status: AC

## 2025-09-04 ENCOUNTER — Other Ambulatory Visit: Payer: Self-pay

## 2025-09-18 ENCOUNTER — Ambulatory Visit: Payer: Self-pay | Admitting: Infectious Disease

## 2025-10-27 ENCOUNTER — Encounter: Admitting: Family Medicine
